# Patient Record
Sex: Male | Born: 1960
Health system: Southern US, Community
[De-identification: ages and names within clinical notes are randomized; demographics above are authoritative.]

## PROBLEM LIST (undated history)

## (undated) DIAGNOSIS — E039 Hypothyroidism, unspecified: Secondary | ICD-10-CM

## (undated) DIAGNOSIS — T4145XA Adverse effect of unspecified anesthetic, initial encounter: Secondary | ICD-10-CM

## (undated) DIAGNOSIS — F32A Depression, unspecified: Secondary | ICD-10-CM

## (undated) DIAGNOSIS — F419 Anxiety disorder, unspecified: Secondary | ICD-10-CM

## (undated) DIAGNOSIS — K635 Polyp of colon: Secondary | ICD-10-CM

## (undated) DIAGNOSIS — F319 Bipolar disorder, unspecified: Secondary | ICD-10-CM

## (undated) DIAGNOSIS — Z9889 Other specified postprocedural states: Secondary | ICD-10-CM

## (undated) DIAGNOSIS — G4730 Sleep apnea, unspecified: Secondary | ICD-10-CM

## (undated) DIAGNOSIS — Z8719 Personal history of other diseases of the digestive system: Secondary | ICD-10-CM

## (undated) DIAGNOSIS — R112 Nausea with vomiting, unspecified: Secondary | ICD-10-CM

## (undated) DIAGNOSIS — M109 Gout, unspecified: Secondary | ICD-10-CM

## (undated) DIAGNOSIS — M1990 Unspecified osteoarthritis, unspecified site: Secondary | ICD-10-CM

## (undated) DIAGNOSIS — T8859XA Other complications of anesthesia, initial encounter: Secondary | ICD-10-CM

## (undated) DIAGNOSIS — R12 Heartburn: Secondary | ICD-10-CM

## (undated) DIAGNOSIS — E119 Type 2 diabetes mellitus without complications: Secondary | ICD-10-CM

## (undated) DIAGNOSIS — K219 Gastro-esophageal reflux disease without esophagitis: Secondary | ICD-10-CM

## (undated) DIAGNOSIS — I10 Essential (primary) hypertension: Secondary | ICD-10-CM

## (undated) DIAGNOSIS — F329 Major depressive disorder, single episode, unspecified: Secondary | ICD-10-CM

## (undated) HISTORY — PX: REPLACEMENT TOTAL KNEE BILATERAL: SUR1225

## (undated) HISTORY — DX: Polyp of colon: K63.5

## (undated) HISTORY — DX: Bipolar disorder, unspecified: F31.9

## (undated) HISTORY — DX: Gout, unspecified: M10.9

## (undated) HISTORY — DX: Heartburn: R12

## (undated) HISTORY — PX: FOOT SURGERY: SHX648

## (undated) HISTORY — PX: APPENDECTOMY: SHX54

## (undated) HISTORY — PX: HERNIA REPAIR: SHX51

## (undated) HISTORY — PX: ROTATOR CUFF REPAIR: SHX139

## (undated) HISTORY — PX: MANDIBLE SURGERY: SHX707

## (undated) HISTORY — PX: SHOULDER SURGERY: SHX246

---

## 1999-01-09 ENCOUNTER — Ambulatory Visit (HOSPITAL_COMMUNITY): Admission: RE | Admit: 1999-01-09 | Discharge: 1999-01-09 | Payer: Self-pay | Admitting: Orthopaedic Surgery

## 1999-01-09 ENCOUNTER — Encounter: Payer: Self-pay | Admitting: Orthopaedic Surgery

## 2002-09-06 ENCOUNTER — Encounter: Payer: Self-pay | Admitting: Family Medicine

## 2002-09-06 LAB — CONVERTED CEMR LAB: Hgb A1c MFr Bld: 6.4 %

## 2002-09-27 ENCOUNTER — Encounter: Admission: RE | Admit: 2002-09-27 | Discharge: 2002-09-27 | Payer: Self-pay | Admitting: Family Medicine

## 2002-09-27 ENCOUNTER — Encounter: Payer: Self-pay | Admitting: Family Medicine

## 2002-11-07 ENCOUNTER — Encounter: Payer: Self-pay | Admitting: Family Medicine

## 2002-11-07 LAB — CONVERTED CEMR LAB: Hgb A1c MFr Bld: 5.6 %

## 2003-02-05 ENCOUNTER — Encounter: Payer: Self-pay | Admitting: Family Medicine

## 2003-02-05 LAB — CONVERTED CEMR LAB: Hgb A1c MFr Bld: 6.4 %

## 2003-04-20 ENCOUNTER — Encounter: Payer: Self-pay | Admitting: Family Medicine

## 2003-04-20 LAB — CONVERTED CEMR LAB: Microalbumin U total vol: 5.4 mg/L

## 2003-04-21 ENCOUNTER — Encounter: Payer: Self-pay | Admitting: Family Medicine

## 2003-04-21 LAB — CONVERTED CEMR LAB: Hgb A1c MFr Bld: 5.4 %

## 2007-02-24 ENCOUNTER — Ambulatory Visit: Payer: Self-pay | Admitting: Internal Medicine

## 2007-03-16 ENCOUNTER — Ambulatory Visit: Payer: Self-pay | Admitting: Family Medicine

## 2007-03-16 LAB — CONVERTED CEMR LAB
ALT: 25 units/L (ref 0–40)
AST: 25 units/L (ref 0–37)
Albumin: 4 g/dL (ref 3.5–5.2)
Alkaline Phosphatase: 36 units/L — ABNORMAL LOW (ref 39–117)
BUN: 20 mg/dL (ref 6–23)
Basophils Absolute: 0 10*3/uL (ref 0.0–0.1)
Basophils Relative: 0.7 % (ref 0.0–1.0)
Bilirubin, Direct: 0.1 mg/dL (ref 0.0–0.3)
CO2: 27 meq/L (ref 19–32)
Calcium: 9.3 mg/dL (ref 8.4–10.5)
Chloride: 107 meq/L (ref 96–112)
Cholesterol: 120 mg/dL (ref 0–200)
Creatinine, Ser: 1.2 mg/dL (ref 0.4–1.5)
Eosinophils Absolute: 0.1 10*3/uL (ref 0.0–0.6)
Eosinophils Relative: 2.4 % (ref 0.0–5.0)
GFR calc Af Amer: 83 mL/min
GFR calc non Af Amer: 69 mL/min
Glucose, Bld: 112 mg/dL — ABNORMAL HIGH (ref 70–99)
HCT: 44.5 % (ref 39.0–52.0)
HDL: 31.5 mg/dL — ABNORMAL LOW (ref >39.0)
Hemoglobin: 15.2 g/dL (ref 13.0–17.0)
Hgb A1c MFr Bld: 6.3 % — ABNORMAL HIGH (ref 4.6–6.0)
LDL Cholesterol: 62 mg/dL (ref 0–99)
Lymphocytes Relative: 36.6 % (ref 12.0–46.0)
MCHC: 34.1 g/dL (ref 30.0–36.0)
MCV: 83.9 fL (ref 78.0–100.0)
Monocytes Absolute: 0.7 10*3/uL (ref 0.2–0.7)
Monocytes Relative: 11.8 % — ABNORMAL HIGH (ref 3.0–11.0)
Neutro Abs: 2.8 10*3/uL (ref 1.4–7.7)
Neutrophils Relative %: 48.5 % (ref 43.0–77.0)
PSA: 0.39 ng/mL (ref 0.10–4.00)
Platelets: 254 10*3/uL (ref 150–400)
Potassium: 4.7 meq/L (ref 3.5–5.1)
RBC: 5.3 M/uL (ref 4.22–5.81)
RDW: 13.1 % (ref 11.5–14.6)
Sodium: 141 meq/L (ref 135–145)
TSH: 2.03 microintl units/mL (ref 0.35–5.50)
Total Bilirubin: 0.8 mg/dL (ref 0.3–1.2)
Total CHOL/HDL Ratio: 3.8
Total Protein: 7.2 g/dL (ref 6.0–8.3)
Triglycerides: 131 mg/dL (ref 0–149)
VLDL: 26 mg/dL (ref 0–40)
WBC: 5.6 10*3/uL (ref 4.5–10.5)

## 2007-03-17 ENCOUNTER — Encounter: Payer: Self-pay | Admitting: Family Medicine

## 2007-03-19 ENCOUNTER — Ambulatory Visit: Payer: Self-pay | Admitting: Family Medicine

## 2007-03-19 DIAGNOSIS — F329 Major depressive disorder, single episode, unspecified: Secondary | ICD-10-CM | POA: Insufficient documentation

## 2007-03-19 DIAGNOSIS — F32A Depression, unspecified: Secondary | ICD-10-CM | POA: Insufficient documentation

## 2007-03-19 DIAGNOSIS — E119 Type 2 diabetes mellitus without complications: Secondary | ICD-10-CM | POA: Insufficient documentation

## 2007-03-19 DIAGNOSIS — F518 Other sleep disorders not due to a substance or known physiological condition: Secondary | ICD-10-CM | POA: Insufficient documentation

## 2007-03-19 DIAGNOSIS — M159 Polyosteoarthritis, unspecified: Secondary | ICD-10-CM | POA: Insufficient documentation

## 2007-03-19 DIAGNOSIS — G4720 Circadian rhythm sleep disorder, unspecified type: Secondary | ICD-10-CM

## 2007-03-19 DIAGNOSIS — M109 Gout, unspecified: Secondary | ICD-10-CM | POA: Insufficient documentation

## 2007-06-12 ENCOUNTER — Ambulatory Visit: Payer: Self-pay | Admitting: Internal Medicine

## 2007-06-12 DIAGNOSIS — J019 Acute sinusitis, unspecified: Secondary | ICD-10-CM | POA: Insufficient documentation

## 2007-06-12 DIAGNOSIS — R109 Unspecified abdominal pain: Secondary | ICD-10-CM | POA: Insufficient documentation

## 2007-07-14 ENCOUNTER — Encounter: Payer: Self-pay | Admitting: Family Medicine

## 2007-08-07 ENCOUNTER — Ambulatory Visit: Payer: Self-pay | Admitting: Family Medicine

## 2007-08-07 DIAGNOSIS — R1032 Left lower quadrant pain: Secondary | ICD-10-CM | POA: Insufficient documentation

## 2007-08-07 DIAGNOSIS — K921 Melena: Secondary | ICD-10-CM | POA: Insufficient documentation

## 2007-08-07 DIAGNOSIS — K644 Residual hemorrhoidal skin tags: Secondary | ICD-10-CM | POA: Insufficient documentation

## 2007-08-11 ENCOUNTER — Ambulatory Visit: Payer: Self-pay | Admitting: Gastroenterology

## 2007-08-19 ENCOUNTER — Ambulatory Visit (HOSPITAL_COMMUNITY): Admission: RE | Admit: 2007-08-19 | Discharge: 2007-08-19 | Payer: Self-pay | Admitting: Gastroenterology

## 2007-08-19 ENCOUNTER — Encounter: Payer: Self-pay | Admitting: Gastroenterology

## 2007-08-19 ENCOUNTER — Encounter (INDEPENDENT_AMBULATORY_CARE_PROVIDER_SITE_OTHER): Payer: Self-pay | Admitting: Internal Medicine

## 2007-08-19 DIAGNOSIS — K648 Other hemorrhoids: Secondary | ICD-10-CM | POA: Insufficient documentation

## 2007-08-19 DIAGNOSIS — D126 Benign neoplasm of colon, unspecified: Secondary | ICD-10-CM | POA: Insufficient documentation

## 2007-08-25 ENCOUNTER — Ambulatory Visit: Payer: Self-pay | Admitting: Gastroenterology

## 2007-08-25 LAB — CONVERTED CEMR LAB
Fecal Occult Blood: NEGATIVE
OCCULT 1: POSITIVE — AB
OCCULT 2: POSITIVE — AB
OCCULT 3: NEGATIVE
OCCULT 4: NEGATIVE
OCCULT 5: NEGATIVE

## 2007-08-26 ENCOUNTER — Ambulatory Visit: Payer: Self-pay | Admitting: Gastroenterology

## 2007-08-27 ENCOUNTER — Encounter (INDEPENDENT_AMBULATORY_CARE_PROVIDER_SITE_OTHER): Payer: Self-pay | Admitting: Internal Medicine

## 2007-09-09 ENCOUNTER — Ambulatory Visit: Payer: Self-pay | Admitting: Gastroenterology

## 2007-09-09 ENCOUNTER — Encounter: Payer: Self-pay | Admitting: Gastroenterology

## 2007-09-09 ENCOUNTER — Encounter (INDEPENDENT_AMBULATORY_CARE_PROVIDER_SITE_OTHER): Payer: Self-pay | Admitting: Internal Medicine

## 2007-09-09 DIAGNOSIS — K250 Acute gastric ulcer with hemorrhage: Secondary | ICD-10-CM | POA: Insufficient documentation

## 2007-09-09 DIAGNOSIS — K449 Diaphragmatic hernia without obstruction or gangrene: Secondary | ICD-10-CM | POA: Insufficient documentation

## 2007-09-09 DIAGNOSIS — A048 Other specified bacterial intestinal infections: Secondary | ICD-10-CM | POA: Insufficient documentation

## 2007-09-10 ENCOUNTER — Encounter (INDEPENDENT_AMBULATORY_CARE_PROVIDER_SITE_OTHER): Payer: Self-pay | Admitting: Internal Medicine

## 2007-09-16 ENCOUNTER — Ambulatory Visit: Payer: Self-pay | Admitting: Gastroenterology

## 2007-10-12 ENCOUNTER — Ambulatory Visit: Payer: Self-pay | Admitting: Family Medicine

## 2007-10-12 DIAGNOSIS — M545 Low back pain, unspecified: Secondary | ICD-10-CM | POA: Insufficient documentation

## 2007-10-14 ENCOUNTER — Ambulatory Visit: Payer: Self-pay | Admitting: Gastroenterology

## 2007-10-14 LAB — CONVERTED CEMR LAB
Fecal Occult Blood: POSITIVE — AB
OCCULT 1: NEGATIVE
OCCULT 2: NEGATIVE
OCCULT 3: NEGATIVE
OCCULT 4: NEGATIVE
OCCULT 5: POSITIVE — AB

## 2007-10-15 ENCOUNTER — Encounter: Payer: Self-pay | Admitting: Gastroenterology

## 2007-10-15 ENCOUNTER — Encounter (INDEPENDENT_AMBULATORY_CARE_PROVIDER_SITE_OTHER): Payer: Self-pay | Admitting: Internal Medicine

## 2007-10-15 ENCOUNTER — Ambulatory Visit: Payer: Self-pay | Admitting: Gastroenterology

## 2007-10-15 LAB — CONVERTED CEMR LAB
Basophils Absolute: 0 10*3/uL (ref 0.0–0.1)
Basophils Relative: 1 % (ref 0.0–1.0)
Eosinophils Absolute: 0.1 10*3/uL (ref 0.0–0.6)
Eosinophils Relative: 1.8 % (ref 0.0–5.0)
HCT: 42.4 % (ref 39.0–52.0)
Hemoglobin: 13.9 g/dL (ref 13.0–17.0)
Lymphocytes Relative: 27.7 % (ref 12.0–46.0)
MCHC: 32.7 g/dL (ref 30.0–36.0)
MCV: 73.5 fL — ABNORMAL LOW (ref 78.0–100.0)
Monocytes Absolute: 0.5 10*3/uL (ref 0.2–0.7)
Monocytes Relative: 11.5 % — ABNORMAL HIGH (ref 3.0–11.0)
Neutro Abs: 2.6 10*3/uL (ref 1.4–7.7)
Neutrophils Relative %: 58 % (ref 43.0–77.0)
Platelets: 250 10*3/uL (ref 150–400)
RBC: 5.77 M/uL (ref 4.22–5.81)
RDW: 15.9 % — ABNORMAL HIGH (ref 11.5–14.6)
WBC: 4.4 10*3/uL — ABNORMAL LOW (ref 4.5–10.5)

## 2007-11-02 ENCOUNTER — Ambulatory Visit: Payer: Self-pay | Admitting: Gastroenterology

## 2007-11-02 ENCOUNTER — Ambulatory Visit: Payer: Self-pay | Admitting: Family Medicine

## 2007-11-02 LAB — CONVERTED CEMR LAB
Fecal Occult Blood: NEGATIVE
OCCULT 1: NEGATIVE
OCCULT 2: NEGATIVE
OCCULT 3: NEGATIVE
OCCULT 4: NEGATIVE
OCCULT 5: NEGATIVE

## 2007-11-03 ENCOUNTER — Encounter (INDEPENDENT_AMBULATORY_CARE_PROVIDER_SITE_OTHER): Payer: Self-pay | Admitting: Internal Medicine

## 2007-11-05 ENCOUNTER — Encounter (INDEPENDENT_AMBULATORY_CARE_PROVIDER_SITE_OTHER): Payer: Self-pay | Admitting: Internal Medicine

## 2007-11-06 ENCOUNTER — Encounter: Payer: Self-pay | Admitting: Family Medicine

## 2007-11-09 ENCOUNTER — Ambulatory Visit: Payer: Self-pay | Admitting: Gastroenterology

## 2007-11-09 DIAGNOSIS — Z860100 Personal history of colon polyps, unspecified: Secondary | ICD-10-CM | POA: Insufficient documentation

## 2007-11-09 DIAGNOSIS — R195 Other fecal abnormalities: Secondary | ICD-10-CM | POA: Insufficient documentation

## 2007-11-09 DIAGNOSIS — K253 Acute gastric ulcer without hemorrhage or perforation: Secondary | ICD-10-CM | POA: Insufficient documentation

## 2007-11-09 DIAGNOSIS — K2980 Duodenitis without bleeding: Secondary | ICD-10-CM | POA: Insufficient documentation

## 2007-11-16 ENCOUNTER — Ambulatory Visit: Payer: Self-pay | Admitting: Family Medicine

## 2007-11-16 ENCOUNTER — Encounter (INDEPENDENT_AMBULATORY_CARE_PROVIDER_SITE_OTHER): Payer: Self-pay | Admitting: Internal Medicine

## 2007-12-02 ENCOUNTER — Ambulatory Visit: Payer: Self-pay | Admitting: Gastroenterology

## 2007-12-02 LAB — CONVERTED CEMR LAB
Fecal Occult Blood: NEGATIVE
OCCULT 1: NEGATIVE
OCCULT 2: NEGATIVE
OCCULT 3: NEGATIVE
OCCULT 4: NEGATIVE
OCCULT 5: NEGATIVE

## 2008-01-07 ENCOUNTER — Ambulatory Visit: Payer: Self-pay | Admitting: Family Medicine

## 2008-01-07 DIAGNOSIS — N63 Unspecified lump in unspecified breast: Secondary | ICD-10-CM | POA: Insufficient documentation

## 2008-01-07 DIAGNOSIS — G4700 Insomnia, unspecified: Secondary | ICD-10-CM | POA: Insufficient documentation

## 2008-01-08 ENCOUNTER — Encounter: Admission: RE | Admit: 2008-01-08 | Discharge: 2008-01-08 | Payer: Self-pay | Admitting: Family Medicine

## 2008-01-12 ENCOUNTER — Telehealth (INDEPENDENT_AMBULATORY_CARE_PROVIDER_SITE_OTHER): Payer: Self-pay | Admitting: Internal Medicine

## 2008-03-21 ENCOUNTER — Telehealth (INDEPENDENT_AMBULATORY_CARE_PROVIDER_SITE_OTHER): Payer: Self-pay | Admitting: Internal Medicine

## 2008-03-24 ENCOUNTER — Telehealth (INDEPENDENT_AMBULATORY_CARE_PROVIDER_SITE_OTHER): Payer: Self-pay | Admitting: Internal Medicine

## 2008-03-31 ENCOUNTER — Telehealth (INDEPENDENT_AMBULATORY_CARE_PROVIDER_SITE_OTHER): Payer: Self-pay | Admitting: Internal Medicine

## 2008-04-05 ENCOUNTER — Encounter (INDEPENDENT_AMBULATORY_CARE_PROVIDER_SITE_OTHER): Payer: Self-pay | Admitting: Internal Medicine

## 2008-04-11 ENCOUNTER — Encounter: Admission: RE | Admit: 2008-04-11 | Discharge: 2008-04-11 | Payer: Self-pay | Admitting: Family Medicine

## 2008-04-20 ENCOUNTER — Encounter (INDEPENDENT_AMBULATORY_CARE_PROVIDER_SITE_OTHER): Payer: Self-pay | Admitting: *Deleted

## 2008-09-20 ENCOUNTER — Encounter (INDEPENDENT_AMBULATORY_CARE_PROVIDER_SITE_OTHER): Payer: Self-pay | Admitting: Internal Medicine

## 2008-10-24 ENCOUNTER — Ambulatory Visit: Payer: Self-pay | Admitting: Gastroenterology

## 2008-11-10 ENCOUNTER — Ambulatory Visit: Payer: Self-pay | Admitting: Gastroenterology

## 2008-11-28 ENCOUNTER — Encounter
Admission: RE | Admit: 2008-11-28 | Discharge: 2008-11-28 | Payer: Self-pay | Admitting: Physical Medicine and Rehabilitation

## 2009-06-11 ENCOUNTER — Emergency Department (HOSPITAL_COMMUNITY): Admission: EM | Admit: 2009-06-11 | Discharge: 2009-06-11 | Payer: Self-pay | Admitting: Emergency Medicine

## 2009-07-28 ENCOUNTER — Encounter: Admission: RE | Admit: 2009-07-28 | Discharge: 2009-08-25 | Payer: Self-pay | Admitting: Occupational Medicine

## 2009-09-25 ENCOUNTER — Ambulatory Visit: Payer: Self-pay | Admitting: Sports Medicine

## 2009-09-25 DIAGNOSIS — M25579 Pain in unspecified ankle and joints of unspecified foot: Secondary | ICD-10-CM | POA: Insufficient documentation

## 2009-09-25 DIAGNOSIS — S96819A Strain of other specified muscles and tendons at ankle and foot level, unspecified foot, initial encounter: Secondary | ICD-10-CM | POA: Insufficient documentation

## 2009-09-25 DIAGNOSIS — S93499A Sprain of other ligament of unspecified ankle, initial encounter: Secondary | ICD-10-CM | POA: Insufficient documentation

## 2009-09-25 DIAGNOSIS — G5750 Tarsal tunnel syndrome, unspecified lower limb: Secondary | ICD-10-CM | POA: Insufficient documentation

## 2009-10-04 ENCOUNTER — Ambulatory Visit: Payer: Self-pay | Admitting: Gastroenterology

## 2009-10-04 DIAGNOSIS — R1012 Left upper quadrant pain: Secondary | ICD-10-CM | POA: Insufficient documentation

## 2009-10-12 ENCOUNTER — Telehealth: Payer: Self-pay | Admitting: Gastroenterology

## 2009-10-12 ENCOUNTER — Ambulatory Visit: Payer: Self-pay | Admitting: Gastroenterology

## 2009-10-16 ENCOUNTER — Encounter: Payer: Self-pay | Admitting: Gastroenterology

## 2009-10-20 ENCOUNTER — Ambulatory Visit (HOSPITAL_COMMUNITY): Admission: RE | Admit: 2009-10-20 | Discharge: 2009-10-20 | Payer: Self-pay | Admitting: Gastroenterology

## 2009-11-07 ENCOUNTER — Ambulatory Visit: Payer: Self-pay | Admitting: Sports Medicine

## 2009-11-15 ENCOUNTER — Ambulatory Visit: Payer: Self-pay | Admitting: Gastroenterology

## 2009-11-16 ENCOUNTER — Encounter: Payer: Self-pay | Admitting: Sports Medicine

## 2010-06-20 ENCOUNTER — Ambulatory Visit: Payer: Self-pay | Admitting: Surgery

## 2010-06-22 LAB — PATHOLOGY REPORT

## 2010-10-28 ENCOUNTER — Encounter: Payer: Self-pay | Admitting: Gastroenterology

## 2010-11-08 NOTE — Miscellaneous (Signed)
Summary: GI PV  Clinical Lists Changes  Medications: Added new medication of DULCOLAX 5 MG  TBEC (BISACODYL) Day before procedure take 2 at 3pm and 2 at 8pm. - Signed Added new medication of METOCLOPRAMIDE HCL 10 MG  TABS (METOCLOPRAMIDE HCL) As per prep instructions. - Signed Added new medication of MIRALAX   POWD (POLYETHYLENE GLYCOL 3350) As per prep  instructions. - Signed Rx of DULCOLAX 5 MG  TBEC (BISACODYL) Day before procedure take 2 at 3pm and 2 at 8pm.;  #4 x 0;  Signed;  Entered by: Barton Fanny RN;  Authorized by: Louis Meckel MD;  Method used: [facility] [place], [place]  [address], Ph: [phone] or [phone], Fax: [phone] Rx of METOCLOPRAMIDE HCL 10 MG  TABS (METOCLOPRAMIDE HCL) As per prep instructions.;  #2 x 0;  Signed;  Entered by: Barton Fanny RN;  Authorized by: Louis Meckel MD;  Method used: [facility] [place], [place]  [address], Ph: [phone] or [phone], Fax: [phone] Rx of MIRALAX   POWD (POLYETHYLENE GLYCOL 3350) As per prep  instructions.;  #255gm x 0;  Signed;  Entered by: Barton Fanny RN;  Authorized by: Louis Meckel MD;  Method used: [facility]ically [facility]  [address], [place], [place]  [address], Ph: [phone] or [phone], Fax: [phone] Allergies: Changed allergy or adverse reaction from * TEQUIN to * TEQUIN Observations: Added new observation of ALLERGY REV: Done (10/24/2008 8:54)    Prescriptions: MIRALAX   POWD (POLYETHYLENE GLYCOL 3350) As per prep  instructions.  #255gm x 0   Entered by:   Barton Fanny RN   Authorized by:   Louis Meckel MD   Signed by:   Barton Fanny RN on 10/24/2008   Method used:   [facility]ronically to        [facility] (retail)       [address]       [place], [place]  [address]       Ph: [phone] or [phone]       Fax: [phone]   RxID:    [id number] METOCLOPRAMIDE HCL 10 MG  TABS (METOCLOPRAMIDE HCL) As per prep instructions.  #2 x 0   Entered by:   Barton Fanny RN   Authorized by:   Louis Meckel MD   Signed by:   Barton Fanny RN on 10/24/2008   Method used:   [facility]        [facility] (retail)       [address]       [place], [place]  [address]       Ph: [phone] or [phone]       Fax: [phone]   RxID:   [id number] DULCOLAX 5 MG  TBEC (BISACODYL) Day before procedure take 2 at 3pm and 2 at 8pm.  #4 x 0   Entered by:   Barton Fanny RN   Authorized by:   Louis Meckel MD   Signed by:   Barton Fanny RN on 10/24/2008   Method used:   [facility]        [facility] (retail)       [address]       [place], [place]  [address]       Ph: [phone] or [phone]       Fax: [phone]   RxID:  [id number]  

## 2010-11-08 NOTE — Procedures (Signed)
Summary: Panendoscopy w/ biopsy  Panendoscopy w/ biopsy   Imported By: Beau Fanny 10/16/2007 16:09:06  _____________________________________________________________________  External Attachment:    Type:   Image     Comment:   External Document

## 2010-11-08 NOTE — Assessment & Plan Note (Signed)
Summary: STOMACH/CLE   Vital Signs:  Patient Profile:   50 Years Old Male Weight:      218.25 pounds Temp:     98 degrees F oral Pulse rate:   72 / minute BP sitting:   110 / 78  (left arm) Cuff size:   large  Vitals Entered By: Wandra Mannan (Feb 24, 2007 11:59 AM)               Visit Type:  Acute Visit PCP:  Hetty Ely  Chief Complaint:  stomach.  History of Present Illness: Started Saturday night with  pain and then he had loose stools. Has had continued last night and today. Headache and pain in neck. Feels hungry but then when he eats the pain starts back uo. Sometimes has reflux with certain foods. Onset also accompanied by a lot of gas. No nausea.  Blood Sugar are running good. A1C in March was 5.5 .  Current Allergies (reviewed today): ! AMOXICILLIN (AMOXICILLIN)  Past Medical History:    Depression    Diabetes mellitus, type II- 12/ 2003    Gout- 2000  Past Surgical History:    Rotator cuff repair-2000 to 2004 patient had 3 surgeries on each shoulder, right and left.   Family History:    Both parents are alive and in their mid-late 32's.    Father: DM, Stroke, MI, PUD, Colon polyps ( - )    Mother:Chest pain and diverticulitis    XTG:GYIR age 66, Heart disease and DM    mgf: ????    pgm: Died with Cancer of the stomach    pgf: Died as result of MVA    Family History Depression    Family History Diabetes 1st degree relative    Family History Lung cancer    Family History of Cardiovascular disorder    Also history of stomach cancer- 2 aunts and mgm    Review of Systems       The patient complains of dyspnea on exhertion, abdominal pain, and severe indigestion/heartburn.  The patient denies fever, chest pain, and syncope.         Also has had diarrhea, runny not really liquid consistency.   Physical Exam  General:     alert, well-developed, well-nourished, well-hydrated, appropriate dress, normal appearance, healthy-appearing, cooperative to  examination, and good hygiene.   Neck:     supple.   Lungs:     normal respiratory effort, no accessory muscle use, and normal breath sounds.   Heart:     normal rate, regular rhythm, no gallop, and no rub.   Abdomen:     Soft, mild tenderness across the lower third of abd. Bowel sounds are slightly sluggish but auscultated. No rebound tenderness, actually feels beter when the pressure applied to the left LQ. Has had a problem with reflux in past that was food related. Psych:     Oriented X3, memory intact for recent and remote, normally interactive, good eye contact, not depressed appearing, not agitated, and slightly anxious.      Impression & Recommendations:  Problem # 1:  ABDOMINAL PAIN, EPIGASTRIC (ICD-789.06) Patient has some abd discomfort since Saturday with changes in the bowel movements' consistency. Bland diet for 48 hours and then gradually increase back to regular foods. Avoid the fatty, greasy foods. Prevacid 30 mg by mouth every morning at least 30 minutes before breakfast.  Medications Added to Medication List This Visit: 1)  Claritin 10 Mg Tabs (Loratadine) .[name].. 1 tab daily 2)  Zoloft 50 Mg Tabs (Sertraline hcl) .[name].. 1 tab by mouth daily 3)  Depo-testosterone 100 Mg/ml Oil (Testosterone cypionate) .... Takes 1.5 cc's every three weeks.   Patient Instructions: 1)  Bland diet for 48 hours and then start adding in the foods you normally eat. Avoid the greasy, fatty foods. 2)  Prevacid 30 mg by mouth every morning before the first meal of the day. Sampled X 20 caps.  3)  If improvement after the foods are added back, when the Prevacid is done start Prilosec 40 mg by mouth daily. Generic is available now for this drug. 4)  Keep appointment with Dr. Hetty Ely in June.  5)  RTC sooner than June if no improvement with these changes.

## 2010-11-08 NOTE — Medication Information (Signed)
Summary: [employer] Prior Auth Form-Proton Pump Inhibitor  [employer] Prior Auth Form-Proton Pump Inhibitor   Imported By: Beau Fanny 11/06/2007 13:40:23  _____________________________________________________________________  External Attachment:    Type:   Image     Comment:   External Document

## 2010-11-08 NOTE — Procedures (Signed)
Summary: EGD w/ biopsy brushing  EGD w/ biopsy brushing   Imported By: Beau Fanny 09/15/2007 14:53:27  _____________________________________________________________________  External Attachment:    Type:   Image     Comment:   External Document

## 2010-11-08 NOTE — Progress Notes (Signed)
Summary: UGI Scheduled   Phone Note Outgoing Call   Call placed by: Laureen Ochs LPN,  October 12, 2009 3:57 PM Call placed to: Patient Summary of Call: Pt. is scheduled for a Barium UGI at [facility] on 10-17-09 at 10:30am.(NPO after 12mn) I will call pt. tomorrow to advise him of appt. Initial call taken by: Laureen Ochs LPN,  October 12, 2009 3:59 PM  Follow-up for Phone Call        Above appt. information reviewd w/pt. Pt. instructed to call back as needed.  Follow-up by: Laureen Ochs LPN,  October 13, 2009 8:13 AM

## 2010-11-08 NOTE — Consult Note (Signed)
Summary: Consultation Report  Consultation Report   Imported By: Marily Memos 09/26/2009 08:45:25  _____________________________________________________________________  External Attachment:    Type:   Image     Comment:   External Document

## 2010-11-08 NOTE — Procedures (Signed)
Summary: Upper Endoscopy  Patient: [name] Note: All result statuses are Final unless otherwise noted.  Tests: (1) Upper Endoscopy (EGD)   EGD Upper Endoscopy       DONE     [facility]     [address]     [place], [place]  [address]           ENDOSCOPY PROCEDURE REPORT           PATIENT:  Jeremiah [name], Jeremiah [name]  MR#:  [id number]     BIRTHDATE:  11/28/1960, 48 yrs. old  GENDER:  male           ENDOSCOPIST:  Barbette Hair. Arlyce Dice, MD     Referred by:           PROCEDURE DATE:  10/12/2009     PROCEDURE:  EGD with biopsy     ASA CLASS:  Class II     INDICATIONS:  abdominal pain           MEDICATIONS:   Fentanyl 75 mcg IV, Versed 7 mg IV, glycopyrrolate     (Robinal) 0.2 mg IV, 0.6cc simethancone 0.6 cc PO     TOPICAL ANESTHETIC:  Exactacain Spray           DESCRIPTION OF PROCEDURE:   After the risks benefits and     alternatives of the procedure were thoroughly explained, informed     consent was obtained.  The LB [id number]-H180 [id number] endoscope was     introduced through the mouth and advanced to the third portion of     the duodenum, without limitations.  The instrument was slowly     withdrawn as the mucosa was fully examined.     <<PROCEDUREIMAGES>>           An erosion was found in the fundus. Superficial linear erosion     with pigment and small amount of blood at base erosion. Bx taken     (see image3). Large hiatal hernia.  Question of paraesophageal     hiatal hernia.  Otherwise the examination was normal.     Retroflexed views revealed no abnormalities.    The scope was then     withdrawn from the patient and the procedure completed.           COMPLICATIONS:  None           ENDOSCOPIC IMPRESSION:     1) Fundal gastric erosion     2) Questionable paraesophageal hiatal hernia     2) Otherwise normal examination     RECOMMENDATIONS:     1) await pathology results     2) My office will arrange for you to have a Barium UGI test.     This is a radiology test to  examine your UGI tract.           REPEAT EXAM:  No           ______________________________     Barbette Hair. Arlyce Dice, MD           CC:  Julieanne Manson, MD           n.     Rosalie Doctor:   Barbette Hair. Taina Landry at 10/12/2009 08:40 AM           Clarisa Kindred, [id number]  Note: An exclamation mark (!) indicates a result that was not dispersed into the flowsheet. Document Creation Date: 10/12/2009 8:40 AM _______________________________________________________________________  (1) Order result status: Final Collection or observation  date-time: 10/12/2009 08:27 Requested date-time:  Receipt date-time:  Reported date-time:  Referring Physician:   Ordering Physician: Melvia Heaps [phone]) Specimen Source:  Source: Launa Grill Order Number: [id number] Lab site:

## 2010-11-08 NOTE — Assessment & Plan Note (Signed)
Summary: ABD PAIN/HEA   Vital Signs:  Patient Profile:   50 Years Old Male Height:     69.5 inches Weight:      219 pounds Temp:     98.1 degrees F oral Pulse rate:   58 / minute BP sitting:   124 / 85  (left arm)  Vitals Entered By: Cooper Render (August 07, 2007 9:59 AM)                 PCP:  Hetty Ely  Chief Complaint:  abd pain, increased gas, some black, coffee ground stools, and now hemorrhoids.  History of Present Illness: Here due to abd pain x2-3wks, increased flatus and black stools--both lower quads centrally.  Diarrhea yesterday for the first time.  Has taken Prilosec for 3-4days with no difference.  Has changed diet recently to increase bulk.  Has hemorrhoids that flair with constipation, reason for increased fiber..  Has never seen GI.  Has a new Rx for Biaxin for chronic sinusitis from Dr Catalina Gravel not started.  Current Allergies (reviewed today): ! AMOXICILLIN (AMOXICILLIN) * TEQUIN     Review of Systems      See HPI   Physical Exam  General:     alert, well-developed, and well-nourished.  NAD Head:     normocephalic, atraumatic, and no abnormalities observed.   Mouth:     moist Lungs:     normal respiratory effort, no accessory muscle use, and normal breath sounds.   Abdomen:     tender LLQ > remainder of abd slight tenderness in mid epigastric area. soft, normal bowel sounds, no distention, no masses, no guarding, no abdominal hernia, no inguinal hernia, no hepatomegaly, and no splenomegaly.  no referred rebound, does have rebound in LLQ Rectal:     guiac neg,  firm external hemorrhoid, minimal tenderness Neurologic:     alert & oriented X3, sensation intact to light touch, and gait normal.   Skin:     turgor normal, color normal, and no rashes.   Inguinal Nodes:     no R inguinal adenopathy and no L inguinal adenopathy.   Psych:     Oriented X3, normally interactive, and good eye contact.      Impression & Recommendations:   Problem # 1:  ABDOMINAL PAIN, LEFT LOWER QUADRANT (ICD-789.04) Assessment: New due to both LLQ pain and question of melena will refer to GI for eval samples of Nexium 40 given with instructions Orders: Gastroenterology Referral (GI)   Problem # 2:  MELENA (ICD-578.1) Assessment: New per patient Orders: Gastroenterology Referral (GI)   Problem # 3:  EXTERNAL HEMORRHOIDS WITHOUT MENTION COMP (ICD-455.3) Assessment: Unchanged continue Tucks and Prep H as needed.  Complete Medication List: 1)  Zoloft 50 Mg Tabs (Sertraline hcl) .... Taking 1/2 tablet qd 2)  Depo-testosterone 100 Mg/ml Oil (Testosterone cypionate) .... Takes 1.5 cc's every three weeks. 3)  Astelin 137 Mcg/spray Soln (Azelastine hcl) .... As directed 4)  Xyzal 5 Mg Tabs (Levocetirizine dihydrochloride) .... Take 1 tablet by mouth at bedtime 5)  Omnaris 50 Mcg/act Susp (Ciclesonide) .... As directed 6)  Allergy Injections  .... Q wk 7)  Biaxin 500 Mg Tabs (Clarithromycin) .... Take 1 tablet by mouth once a day 8)  Nexium 40 Mg Cpdr (Esomeprazole magnesium) .[name].. 1 qam   Patient Instructions: 1)  refer to GI    Prescriptions: NEXIUM 40 MG  CPDR (ESOMEPRAZOLE MAGNESIUM) 1 qam Brand medically necessary #30 x 6   Entered and Authorized by:  Billie-Lynn Tyler Deis FNP   Signed by:   Gildardo Griffes FNP on 08/07/2007   Method used:   Print then Give to Patient   RxID:   [id number]  ]

## 2010-11-08 NOTE — Consult Note (Signed)
Summary: [facility] - OFFICE NOTE / DR.   [facility] - OFFICE NOTE / DR. RANJAN SHARMA   Imported By: Carin Primrose 11/19/2007 12:47:41  _____________________________________________________________________  External Attachment:    Type:   Image     Comment:   External Document

## 2010-11-08 NOTE — Assessment & Plan Note (Signed)
Summary: F/U,MC   Referring Provider:  n/a Primary Provider:  Julieanne Manson, MD   History of Present Illness: Pt presents for follow-up of right ankle pain that resulted from an injury at work on 06/11/2009. This is a worker's comp case. On the date of injury, he describes having fallen down three steps with his right foot hyper-plantarflexed.  At his last office visit, he was placed in an air cast to wear at all times. He denies any swelling but still has a lot of pain when standing on it for long periods of time or with going up and down stairs. He has both medial and lateral right ankle pain. No swelling and he has not iced it recently. He tried the Vitamin B6 but this was not helpful so he stopped taking it. He has some numbness in his right 1st toe as well as pain under his 2nd and 3rd toes when standing for long periods of time.  At his last office visit, he had an ultrasound of his ankle that showed a small fragment in the tarsal tunnel.   Allergies: 1)  ! Amoxicillin (Amoxicillin) 2)  ! * Tequin  Physical Exam  General:  alert and well-developed.   Head:  normocephalic and atraumatic.   Msk:  Right ankle: Mild swelling in the sinus tarsi + TTP along the tibia-fibular ligament and into the medial gutter and near the ATFL Full ROM in all directions Decreased strength with resisted dorsiflexion and inversion Pain with push off while weight bearing Pain with distal squeeze testing and anterior drawer sign testing Pes cavus with longitudinal arch flattening  Left Ankle: No bony abnormalities, edema or bruising Full ROM without pain 5/5 strength throughout No TTP throughout Neg anterior drawer sign and able to push off easily Neg squeeze testing   Impression & Recommendations:  Problem # 1:  OTHER ANKLE SPRAIN AND STRAIN (ICD-845.09) Assessment Unchanged  Since he has a fragment that was noted on his last ultrasound and has not improved with conservative treatment, the  patient will be referred to a foot and ankle specialist for evaluation He is to continue work as tolerated He is to wear the air cast at all times until evaluated by the foot and ankle specialist Return in 6 weeks if he has not seen the specialist by then  His updated medication list for this problem includes:    Celebrex 200 Mg Caps (Celecoxib) ..... One tablet by mouth once daily  Orders: Orthopedic Referral (Ortho)  Problem # 2:  TARSAL TUNNEL SYNDROME, RIGHT (ICD-355.5) Assessment: Unchanged  Can continue to [place] anti-inflammatories and the air cast as directed  Orders: Orthopedic Referral (Ortho)  Complete Medication List: 1)  Pristiq 100 Mg Xr24h-tab (Desvenlafaxine succinate) .... One tablet by mouth once daily 2)  Depo-testosterone 100 Mg/ml Oil (Testosterone cypionate) .... Takes 1.5 cc's every three weeks. 3)  Astelin 137 Mcg/spray Soln (Azelastine hcl) .... As directed 4)  Omnaris 50 Mcg/act Susp (Ciclesonide) .... As directed 5)  Allergy Injections  .... Q wk 6)  Claritin 10 Mg Tabs (Loratadine) .... Take 1 tablet by mouth once a day 7)  Flomax 0.4 Mg Cp24 (Tamsulosin hcl) .... Take 1 tablet by mouth once a day 8)  Celebrex 200 Mg Caps (Celecoxib) .... One tablet by mouth once daily 9)  Mylanta 200-200-20 Mg/71ml Susp (Alum & mag hydroxide-simeth) .... As needed for heartburn 10)  Aciphex 20 Mg Tbec (Rabeprazole sodium) .... Take one tab daily  Appended Document: F/U,MC Scheduled  pt to see Dr. Lestine Box at [facility]. Appt is on 2.8.11 at 10:30. Address is [address], [address]. Phone number is [phone]. Workers Comp info is [phone] x 2 and claim number is [id number]

## 2010-11-08 NOTE — Letter (Signed)
Summary: Results Follow up Letter  [place] [facility]  [address] [place], [place] [address]   Phone: [phone]  Fax: [phone]    04/20/2008 MRN: [id number]    Jeremiah [name] [address]  [address] [place], [place]  [address]    Dear Mr. Greulich,  The following are the results of your recent test(s):  Test         Result    Pap Smear:        Normal _____  Not Normal _____ Comments: ______________________________________________________ Cholesterol: LDL(Bad cholesterol):         Your goal is less than:         HDL (Good cholesterol):       Your goal is more than: Comments:  ______________________________________________________ Mammogram:  ULTRA SOUND NEGATIVE. WE NEED TO RECHECK IT IN 6 MONTHS.        Normal _____  Not Normal _____ Comments:  ___________________________________________________________________ Hemoccult:        Normal _____  Not normal _______ Comments:    _____________________________________________________________________ Other Tests:    We routinely do not discuss normal results over the telephone.  If you desire a copy of the results, or you have any questions about this information we can discuss them at your next office visit.   Sincerely,     [facility]

## 2010-11-08 NOTE — Assessment & Plan Note (Signed)
Summary: HEADACHES/SINUS   Vital Signs:  Patient Profile:   50 Years Old Male Height:     69.5 inches Weight:      224.38 pounds Temp:     97.1 degrees F oral Pulse rate:   72 / minute BP sitting:   114 / 72  (left arm) Cuff size:   large  Vitals Entered By: Wandra Mannan (June 12, 2007 11:18 AM)                 PCP:  Hetty Ely  Chief Complaint:  head ache   sinus.  History of Present Illness: Having headaches for about 4 weks Posterior neck--thought it was his pillow at first Thought it was tension but now has moved to face---entire head is painful Left temporal pain that is like a toothache--better with pressure some congestion in nose--copious discharge in AM---light green Not much cough No fever No SOB Has tried decongestants and afrin without much help  Has been to sinus problems and it feels like this now  has had some stomach problems. On lodine from [place] for arthritis--better since stopping but has persisted with slight nausea  Current Allergies (reviewed today): ! AMOXICILLIN (AMOXICILLIN) * TEQUIN     Review of Systems      See HPI   Physical Exam  General:     NAD Head:     has maxillary tenderness Ears:     R ear normal and L ear normal.   Nose:     mod inflammation Mouth:     no erythema and no exudates.   Neck:     supple and no cervical lymphadenopathy.   Lungs:     normal respiratory effort and normal breath sounds.   Abdomen:     soft.  Mild tenderness suprapubic    Impression & Recommendations:  Problem # 1:  SINUSITIS, ACUTE (ICD-461.9) Assessment: New persistent symptoms will try Rx His updated medication list for this problem includes:    Zithromax Z-pak 250 Mg Tabs (Azithromycin) .[name]... Take as directed   Problem # 2:  ABDOMINAL PAIN (ICD-789.00) Assessment: New This sounds like he was having gastritis from the lodine he has stopped it and I recommended trying 2 weeks of prilosec to calm it down  Complete  Medication List: 1)  Claritin 10 Mg Tabs (Loratadine) .[name].. 1 tab daily 2)  Zoloft 50 Mg Tabs (Sertraline hcl) .... Taking 1/2 tablet qd 3)  Depo-testosterone 100 Mg/ml Oil (Testosterone cypionate) .... Takes 1.5 cc's every three weeks. 4)  Zithromax Z-pak 250 Mg Tabs (Azithromycin) .... Take as directed   Patient Instructions: 1)  Please schedule a follow-up appointment as needed.    Prescriptions: ZITHROMAX Z-PAK 250 MG  TABS (AZITHROMYCIN) take as directed  #1 x 0   Entered and Authorized by:   Cindee Salt MD   Signed by:   Cindee Salt MD on 06/12/2007   Method used:   Print then Give to Patient   RxID:   [id number]   Prior Medications (reviewed today): CLARITIN 10 MG TABS (LORATADINE) 1 tab daily ZOLOFT 50 MG TABS (SERTRALINE HCL) taking 1/2 tablet qd DEPO-TESTOSTERONE 100 MG/ML OIL (TESTOSTERONE CYPIONATE) Takes 1.5 cc's every three weeks. Current Allergies (reviewed today): ! AMOXICILLIN (AMOXICILLIN) * TEQUIN Current Medications (including changes made in today's visit):  CLARITIN 10 MG TABS (LORATADINE) 1 tab daily ZOLOFT 50 MG TABS (SERTRALINE HCL) taking 1/2 tablet qd DEPO-TESTOSTERONE 100 MG/ML OIL (TESTOSTERONE CYPIONATE) Takes 1.5 cc's every three  weeks. ZITHROMAX Z-PAK 250 MG  TABS (AZITHROMYCIN) take as directed

## 2010-11-08 NOTE — Consult Note (Signed)
Summary: Dr.Sharma,Allergy,Note  Dr.Sharma,Allergy,Note   Imported By: Beau Fanny 11/23/2007 12:02:26  _____________________________________________________________________  External Attachment:    Type:   Image     Comment:   External Document
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
## 2010-11-08 NOTE — Letter (Signed)
Summary: [facility] Attending Physician [facility] Attending Physician Statment   Imported By: Beau Fanny 11/17/2007 09:46:44  _____________________________________________________________________  External Attachment:    Type:   Image     Comment:   External Document

## 2010-11-08 NOTE — Medication Information (Signed)
Summary: [employer] Prior Auth Approval-Nexium 11/06/07-12/05/07  [employer] Prior Auth Approval-Nexium 11/06/07-12/05/07   Imported By: Beau Fanny 11/09/2007 13:18:10  _____________________________________________________________________  External Attachment:    Type:   Image     Comment:   External Document

## 2010-11-08 NOTE — Miscellaneous (Signed)
Summary: [facility]Dr. De Witt Callas   Imported By: Eleonore Chiquito 04/12/2008 15:23:52  _____________________________________________________________________  External Attachment:    Type:   Image     Comment:   External Document

## 2010-11-08 NOTE — Progress Notes (Signed)
Summary: Paperwork for suppose socks ?  Phone Note Call from Patient Call back at [phone]   Caller: Patient Call For: Everrett Coombe, FNP Summary of Call: Caling to find out if you have received paperwork for support socks that he can get from work?   Paperwork is to be signed and sent back to his insurance. Initial call taken by: Sydell Axon,  March 21, 2008 1:40 PM  Follow-up for Phone Call        have recieved no paperwork.  he needs to check with whom ever that should have sent to [place].   Gildardo Griffes FNP  March 22, 2008 1:37 PM   Additional Follow-up for Phone Call Additional follow up Details #1::        pt informed.  will try to get paperwork to [place]. Additional Follow-up by: Cooper Render,  March 22, 2008 2:29 PM

## 2010-11-08 NOTE — Letter (Signed)
Summary: EGD Instructions  [facility]  [address] [place], [place] [address]   Phone: [phone]  Fax: [phone]       ABBAS BEYENE    1961/03/02    MRN: [id number]       Procedure Day /Date:THURSDAY 10/12/2009     Arrival Time: 7:30AM     Procedure Time:8:00AM     Location of Procedure:                    [facility] (4th Floor)   PREPARATION FOR ENDOSCOPY   On 10/12/2009 THE DAY OF THE PROCEDURE:  1.   No solid foods, milk or milk products are allowed after midnight the night before your procedure.  2.   Do not drink anything colored red or purple.  Avoid juices with pulp.  No orange juice.  3.  You may drink clear liquids until 6:00AM which is 2 hours before your procedure.                                                                                                CLEAR LIQUIDS INCLUDE: Water Jello Ice Popsicles Tea (sugar ok, no milk/cream) Powdered fruit flavored drinks Coffee (sugar ok, no milk/cream) Gatorade Juice: apple, white grape, white cranberry  Lemonade Clear bullion, consomm, broth Carbonated beverages (any kind) Strained chicken noodle soup Hard Candy   MEDICATION INSTRUCTIONS  Unless otherwise instructed, you should take regular prescription medications with a small sip of water as early as possible the morning of your procedure.             OTHER INSTRUCTIONS  You will need a responsible adult at least 52 years of age to accompany you and drive you home.   This person must remain in the waiting room during your procedure.  Wear loose fitting clothing that is easily removed.  Leave jewelry and other valuables at home.  However, you may wish to bring a book to read or an iPod/MP3 player to listen to music as you wait for your procedure to start.  Remove all body piercing jewelry and leave at home.  Total time from sign-in until discharge is approximately 2-3 hours.  You should go home directly  after your procedure and rest.  You can resume normal activities the day after your procedure.  The day of your procedure you should not:   Drive   Make legal decisions   Operate machinery   Drink alcohol   Return to work  You will receive specific instructions about eating, activities and medications before you leave.    The above instructions have been reviewed and explained to me by   _______________________    I fully understand and can verbalize these instructions _____________________________ Date _________

## 2010-11-08 NOTE — Letter (Signed)
Summary: [name] OV / DR. RANJAN S  [facility] / OV / DR. RANJAN SHARMA   Imported By: Carin Primrose 10/19/2008 16:45:10  _____________________________________________________________________  External Attachment:    Type:   Image     Comment:   External Document

## 2010-11-08 NOTE — Assessment & Plan Note (Signed)
Summary: FORM  Nurse Visit    Prior Medications: ZOLOFT 50 MG TABS (SERTRALINE HCL) taking 1/2 tablet qd DEPO-TESTOSTERONE 100 MG/ML OIL (TESTOSTERONE CYPIONATE) Takes 1.5 cc's every three weeks. ASTELIN 137 MCG/SPRAY  SOLN (AZELASTINE HCL) as directed XYZAL 5 MG  TABS (LEVOCETIRIZINE DIHYDROCHLORIDE) Take 1 tablet by mouth at bedtime OMNARIS 50 MCG/ACT  SUSP (CICLESONIDE) as directed ALLERGY INJECTIONS () q wk NEXIUM 40 MG  CPDR (ESOMEPRAZOLE MAGNESIUM) 1 qam TRAMADOL HCL 50 MG  TABS (TRAMADOL HCL) 1 q6h as needed pain Current Allergies: ! AMOXICILLIN (AMOXICILLIN) * TEQUIN      pt paid form flma form completion $20 ..................................................................Marland KitchenCarrie Mew  November 26, 2007 2:56 PM

## 2010-11-08 NOTE — Progress Notes (Signed)
Summary: Rx for Compression sockings  Phone Note Call from Patient Call back at [phone]   Caller: Patient Call For: Jeremiah Coombe, FNP Summary of Call: Pt needs a written rx for compression sockings for work.  Call when ready for pick up. Initial call taken by: Sydell Axon,  March 24, 2008 10:30 AM  Follow-up for Phone Call        does he know the compression of his hose?    Billie-Lynn Tyler Deis FNP  March 27, 2008 7:35 PM   Additional Follow-up for Phone Call Additional follow up Details #1::        left msg on voice mail.  Cooper Render  March 28, 2008 4:10 PM  noted Billie-Lynn Tyler Deis FNP  March 28, 2008 4:42 PM

## 2010-11-08 NOTE — Progress Notes (Signed)
Summary: order for compression stockings  Phone Note Call from Patient   Caller: Patient Call For: [name] Summary of Call: pc from pt, has already been measured for compression socks.  all he needs from [place] is an order written on rx pad for compression socks. may leave a msg on home ans mach, when order is ready for pick up. Initial call taken by: Cooper Render,  March 31, 2008 10:50 AM  Follow-up for Phone Call        Rx for knee high compression hose at 20-71mmpressure done, can pick up   Billie-Lynn Tyler Deis FNP  March 31, 2008 5:42 PM   Additional Follow-up for Phone Call Additional follow up Details #1::        msg left on home ans mach rx ready for p/u.  Additional Follow-up by: Cooper Render,  March 31, 2008 5:46 PM

## 2010-11-08 NOTE — Assessment & Plan Note (Signed)
Summary: TENSION IN LOWER BACK/DLO   Vital Signs:  Patient Profile:   50 Years Old Male Height:     69.5 inches Weight:      217 pounds Temp:     98.4 degrees F oral Pulse rate:   64 / minute BP sitting:   129 / 69  (right arm) Cuff size:   large  Vitals Entered By: Cooper Render (October 12, 2007 9:56 AM)                 PCP:  Hetty Ely  Chief Complaint:  low back tension.  History of Present Illness: Here due to low back pain x2wks--no known trauma or unusual activity.  Taking nothing.  Sleeping in bed.  Has not missed work--walks up and down steps.  Labs from [place] 08/27/07--HgbA1c 6.7 on current meds.  Current Allergies (reviewed today): ! AMOXICILLIN (AMOXICILLIN) * TEQUIN Updated/Current Medications (including changes made in today's visit):  ZOLOFT 50 MG TABS (SERTRALINE HCL) taking 1/2 tablet qd DEPO-TESTOSTERONE 100 MG/ML OIL (TESTOSTERONE CYPIONATE) Takes 1.5 cc's every three weeks. ASTELIN 137 MCG/SPRAY  SOLN (AZELASTINE HCL) as directed XYZAL 5 MG  TABS (LEVOCETIRIZINE DIHYDROCHLORIDE) Take 1 tablet by mouth at bedtime OMNARIS 50 MCG/ACT  SUSP (CICLESONIDE) as directed * ALLERGY INJECTIONS q wk NEXIUM 40 MG  CPDR (ESOMEPRAZOLE MAGNESIUM) 1 qam [BMN] TRAMADOL HCL 50 MG  TABS (TRAMADOL HCL) 1 q6h as needed pain SKELAXIN 800 MG  TABS (METAXALONE) 1 qid muscle spasm prn [BMN] LISINOPRIL 10 MG  TABS (LISINOPRIL) 1/2 tab every day      Review of Systems      See HPI   Physical Exam  General:     alert, well-developed, and well-nourished.  NAD Lungs:     normal respiratory effort.   Msk:     no pain with palp spine and both paraspinous areas. pain on forward flex  ~50-60degrees, no pain with lat flex or rotation. pain on internal rotation L and abduction L only. Neurologic:     alert & oriented X3, sensation intact to light touch, and gait normal.   Skin:     turgor normal, color normal, and no rashes.   Psych:     normally interactive.       Impression & Recommendations:  Problem # 1:  BACK PAIN, LUMBAR (ICD-724.2) Assessment: New new lumbar pain, et unknown will try on Skelaxin and Tramadol limit sitting, encouraged standing and lying discussed squatting to pick up anything rather than bending over see back 7-10d if not improved--possible PT referral His updated medication list for this problem includes:    Tramadol Hcl 50 Mg Tabs (Tramadol hcl) .[name]... 1 q6h as needed pain    Skelaxin 800 Mg Tabs (Metaxalone) .[name]... 1 qid muscle spasm prn   Complete Medication List: 1)  Zoloft 50 Mg Tabs (Sertraline hcl) .... Taking 1/2 tablet qd 2)  Depo-testosterone 100 Mg/ml Oil (Testosterone cypionate) .... Takes 1.5 cc's every three weeks. 3)  Astelin 137 Mcg/spray Soln (Azelastine hcl) .... As directed 4)  Xyzal 5 Mg Tabs (Levocetirizine dihydrochloride) .... Take 1 tablet by mouth at bedtime 5)  Omnaris 50 Mcg/act Susp (Ciclesonide) .... As directed 6)  Allergy Injections  .... Q wk 7)  Nexium 40 Mg Cpdr (Esomeprazole magnesium) .[name].. 1 qam 8)  Tramadol Hcl 50 Mg Tabs (Tramadol hcl) .[name].. 1 q6h as needed pain 9)  Skelaxin 800 Mg Tabs (Metaxalone) .[name].. 1 qid muscle spasm prn 10)  Lisinopril 10 Mg Tabs (Lisinopril) .Marland KitchenMarland KitchenMarland Kitchen  1/2 tab every day     Prescriptions: SKELAXIN 800 MG  TABS (METAXALONE) 1 qid muscle spasm prn Brand medically necessary #40 x 0   Entered and Authorized by:   Gildardo Griffes FNP   Signed by:   Gildardo Griffes FNP on 10/12/2007   Method used:   Print then Give to Patient   RxID:   [id number] TRAMADOL HCL 50 MG  TABS (TRAMADOL HCL) 1 q6h as needed pain  #40 x 0   Entered and Authorized by:   Gildardo Griffes FNP   Signed by:   Gildardo Griffes FNP on 10/12/2007   Method used:   Print then Give to Patient   RxID:   [id number]  ]

## 2010-11-08 NOTE — Procedures (Signed)
Summary: Colonoscopy   Colonoscopy  Procedure date:  11/10/2008  Findings:      Location:  [facility].    Procedures Next Due Date:    Colonoscopy: 11/2013  COLONOSCOPY PROCEDURE REPORT  PATIENT:  Jeremiah [name], Jeremiah [name]  MR#:  [id number] BIRTHDATE:   1961/04/26   GENDER:   male  ENDOSCOPIST:   Barbette Hair. Arlyce Dice, MD Referred by: Laurita Quint, M.D.  PROCEDURE DATE:  11/10/2008 PROCEDURE:  Colonoscopy, diagnostic ASA CLASS:   Class I INDICATIONS: 1) history of pre-cancerous (adenomatous) colon polyps   MEDICATIONS:    Fentanyl 75 mcg, Versed 9 mg  DESCRIPTION OF PROCEDURE:   After the risks benefits and alternatives of the procedure were thoroughly explained, informed consent was obtained.  Digital rectal exam was performed and revealed no abnormalities.   The LB [id number] endoscope was introduced through the anus and advanced to the cecum, which was identified by both the appendix and ileocecal valve, without limitations.  The quality of the prep was good, using MiraLax.  The instrument was then slowly withdrawn as the colon was fully examined. <<PROCEDUREIMAGES>>                <<OLD IMAGES>>  FINDINGS:  Mild diverticulosis was found in the sigmoid colon (see image11).  This was otherwise a normal examination (see image2, image3, image5, image7, image8, image10, image13, and image14).   Retroflexed views in the rectum revealed no abnormalities.    The time to cecum =  minutes. The scope was then withdrawn (time =  min) from the patient and the procedure completed.  COMPLICATIONS:   None  ENDOSCOPIC IMPRESSION:  1) Mild diverticulosis in the sigmoid colon  2) Otherwise normal examination RECOMMENDATIONS:  1) colonoscopy  5 years  REPEAT EXAM:   In 5 year(s) for Colonoscopy.   _______________________________ Barbette Hair. Arlyce Dice, MD    CC: Laurita Quint, MD

## 2010-11-08 NOTE — Letter (Signed)
Summary: *Referral Letter  [facility]  [address]   [place], [place] [address]   Phone: [phone]  Fax: [phone]    11/16/2009 Dr. Leonides Grills [facility] Fax [phone]  Dear Dr. Lestine Box:  Thank you in advance for agreeing to see my patient:  Jeremiah [name] [address] [place], [place]  [address]  Phone: [phone]  Reason for Referral: This is a very nice, hard working gentleman who had a work related ankle injury involving extreme plantar flexion.  Original injury in September and failed to respond to conservative care.  I have seen him twice and felt on [facility] [place] scanning that he might have a partial tear of his tibio-fibular syndesmosis.  I could not detect any loose body or talar dome injury but he did have significant swelling persisting.  We splinted him for additional time but with failure to respond to that I think he needs your expertise and further diagnostic testing.  Procedures Requested: Consultation and probable ankle arthroscopy or other surgery if warranted.  Current Medical Problems: 1)  ABDOMINAL PAIN, LEFT UPPER QUADRANT (ICD-789.02) 2)  TARSAL TUNNEL SYNDROME, RIGHT (ICD-355.5) 3)  OTHER ANKLE SPRAIN AND STRAIN (ICD-845.09) 4)  ANKLE PAIN, RIGHT (ICD-719.47)     Current Medications: 1)  PRISTIQ 100 MG XR24H-TAB (DESVENLAFAXINE SUCCINATE) one tablet by mouth once daily 2)  DEPO-TESTOSTERONE 100 MG/ML OIL (TESTOSTERONE CYPIONATE) Takes 1.5 cc's every three weeks. 3)  ASTELIN 137 MCG/SPRAY  SOLN (AZELASTINE HCL) as directed 4)  OMNARIS 50 MCG/ACT  SUSP (CICLESONIDE) as directed 5)  * ALLERGY INJECTIONS q wk 6)  CLARITIN 10 MG  TABS (LORATADINE) Take 1 tablet by mouth once a day 7)  FLOMAX 0.4 MG  CP24 (TAMSULOSIN HCL) Take 1 tablet by mouth once a day 8)  CELEBREX 200 MG CAPS (CELECOXIB) one tablet by mouth once daily 9)  MYLANTA 200-200-20 MG/5ML SUSP (ALUM & MAG HYDROXIDE-SIMETH) as needed for heartburn 10)   ACIPHEX 20 MG TBEC (RABEPRAZOLE SODIUM) take one tab daily 11)  * PREDNISONE TAPER take as directed   Past Medical History: 1)  Current Problems:  2)  COLONIC POLYPS, ADENOMATOUS (ICD-211.3) 3)  HEMOCCULT POSITIVE STOOL (ICD-578.1) 4)  HIATAL HERNIA (ICD-553.3) 5)  GASTRIC ULCER, ACUTE, HEMORRHAGE (ICD-531.00) 6)  HELICOBACTER PYLORI GASTRITIS (ICD-041.86) 7)  INTERNAL HEMORRHOIDS (ICD-455.0) 8)  EXTERNAL HEMORRHOIDS WITHOUT MENTION COMP (ICD-455.3)   Thank you again for agreeing to see our patient; please contact us at [phone] if you have any further questions or need additional information.  Sincerely,   Vincent Gros MD

## 2010-11-08 NOTE — Consult Note (Signed)
Summary: [facility] / Dr. Senecaville Callas  [facility] / Dr. Travilah Callas   Imported By: Carin Primrose 07/23/2007 13:28:23  _____________________________________________________________________  External Attachment:    Type:   Image     Comment:   External Document

## 2010-11-08 NOTE — Assessment & Plan Note (Signed)
Summary: RIGHT BREAST LUMP/MK   Vital Signs:  Patient Profile:   50 Years Old Male Height:     69.5 inches Weight:      217 pounds Temp:     98.5 degrees F oral Pulse rate:   60 / minute BP sitting:   126 / 93  (right arm) Cuff size:   large  Vitals Entered By: Cooper Render (January 07, 2008 8:22 AM)                 PCP:  Hetty Ely  Chief Complaint:  lump R) breast.  History of Present Illness: Here due to lump in R breast--found 2wks ago, got smaller and tenderness went away, with manipulation came back and is tender--not sure is larger, thinks is harder.  No nipple discharge.  No family hx of breast cancer.  Still not sleeping with shift change q2wks.  Has tried OTC sleep aids, encluding melatonin with no improvement.      Current Allergies (reviewed today): ! AMOXICILLIN (AMOXICILLIN) * TEQUIN  Past Medical History:    Reviewed history from 02/24/2007 and no changes required:       Depression       Diabetes mellitus, type II- 12/ 2003       Gout- 2000     Review of Systems      See HPI   Physical Exam  General:     alert, well-developed, well-nourished, and well-hydrated.  NAD Breasts:     mass 2-58mm sy 11 oclock and another aboout the same size at 9 oclock Lungs:     normal respiratory effort, no intercostal retractions, no accessory muscle use, and normal breath sounds.   Neurologic:     alert & oriented X3, sensation intact to light touch, and gait normal.   Skin:     turgor normal, color normal, and no rashes.   Axillary Nodes:     no R axillary adenopathy and no L axillary adenopathy.   Psych:     normally interactive, good eye contact, and slightly anxious.      Impression & Recommendations:  Problem # 1:  BREAST MASS, RIGHT (ICD-611.72) Assessment: New new masses found by patients girlfriend will refer for mammo/U/S  refer as needed discussion re possibe pathology  Orders: Radiology Referral (Radiology)   Problem # 2:  INSOMNIA  (ICD-780.52) Assessment: Unchanged due to shift change q2wks and continued problems establishing a sleep program, will start on Rozerem q bedtime samples and discussion re how it works done call response in 2 wks His updated medication list for this problem includes:    Rozerem 8 Mg Tabs (Ramelteon) .[name]... 1 before hs   Complete Medication List: 1)  Zoloft 50 Mg Tabs (Sertraline hcl) .... Taking 1/2 tablet qd 2)  Depo-testosterone 100 Mg/ml Oil (Testosterone cypionate) .... Takes 1.5 cc's every three weeks. 3)  Astelin 137 Mcg/spray Soln (Azelastine hcl) .... As directed 4)  Omnaris 50 Mcg/act Susp (Ciclesonide) .... As directed 5)  Allergy Injections  .... Q wk 6)  Tramadol Hcl 50 Mg Tabs (Tramadol hcl) .[name].. 1 q6h as needed pain 7)  Claritin 10 Mg Tabs (Loratadine) .... Take 1 tablet by mouth once a day 8)  Prilosec Otc 20 Mg Tbec (Omeprazole magnesium) .... Take 1 tablet by mouth once a day 9)  Flomax 0.4 Mg Cp24 (Tamsulosin hcl) .... Take 1 tablet by mouth once a day 10)  Rozerem 8 Mg Tabs (Ramelteon) .[name].. 1 before hs  Patient Instructions: 1)  refers to Breast [facility]Enter    ] Prior Medications (reviewed today): ZOLOFT 50 MG TABS (SERTRALINE HCL) taking 1/2 tablet qd DEPO-TESTOSTERONE 100 MG/ML OIL (TESTOSTERONE CYPIONATE) Takes 1.5 cc's every three weeks. ASTELIN 137 MCG/SPRAY  SOLN (AZELASTINE HCL) as directed OMNARIS 50 MCG/ACT  SUSP (CICLESONIDE) as directed ALLERGY INJECTIONS () q wk TRAMADOL HCL 50 MG  TABS (TRAMADOL HCL) 1 q6h as needed pain CLARITIN 10 MG  TABS (LORATADINE) Take 1 tablet by mouth once a day PRILOSEC OTC 20 MG  TBEC (OMEPRAZOLE MAGNESIUM) Take 1 tablet by mouth once a day FLOMAX 0.4 MG  CP24 (TAMSULOSIN HCL) Take 1 tablet by mouth once a day ROZEREM 8 MG  TABS (RAMELTEON) 1 before hs Current Allergies (reviewed today): ! AMOXICILLIN (AMOXICILLIN) * Lorenda Cahill

## 2010-11-08 NOTE — Letter (Signed)
Summary: Generic Letter  [facility]  [address] [place], [place] [address]   Phone: [phone]  Fax: [phone]    10/16/2009  KAHLE MCQUEEN [address]  [address] [place], [place]  [address]  Dear Jeremiah [name],   Your biopsies demonstrated inflammatory changes only.    Please follow the recommendations previously discussed.  Should you have any immediate concerns or questions, feel free to contact me at the office.    Sincerely,  Barbette Hair. Arlyce Dice, M.D., [facility]            Sincerely,   Melvia Heaps MD  Appended Document: Generic Letter October 16, 2009 Jeremiah [name]    Jeremiah [name] [address]  [address] [place], [place]  [address]     Dear Jeremiah [name],   Your biopsies demonstrated inflammatory changes only.    Please follow the recommendations previously discussed.  Should you have any immediate concerns or questions, feel free to contact me at the office.      Sincerely,  Barbette Hair. Arlyce Dice, M.D., [facility]    Letter mailed 01.12.11

## 2010-11-08 NOTE — Assessment & Plan Note (Signed)
Summary: CPX/HEA   Vital Signs:  Patient Profile:   50 Years Old Male Height:     69.5 inches Weight:      217.25 pounds Temp:     98.6 degrees F oral Pulse rate:   64 / minute Pulse rhythm:   regular BP sitting:   100 / 70  (left arm) Cuff size:   large  Vitals Entered By: Providence Crosby (March 19, 2007 3:03 PM)               PCP:  Hetty Ely  Chief Complaint:  cpx .  History of Present Illness: Works rotating shifts. Has difficulty sleeping. O/w has arthritis in knees Gets injections q 6mos but has lots of pain.  Current Allergies (reviewed today): ! AMOXICILLIN (AMOXICILLIN) * TEQUIN   Family History:        Father: A  18  DM, Stroke, MI, PUD, Colon polyps ( - )    Mother: A  68 Chest pain and diverticulitis    Sister A 41 Bipolar Manic/Depressive RAD Migraines    XBJ:YNWG age 78, Heart disease and DM    pgm: Died with Cancer of the stomach    pgf: Died as result of MVA    Family History Depression    Family History Diabetes 1st degree relative    Family History Lung cancer    Family History of Cardiovascular disorder    Also history of stomach cancer- 2 aunts and mgm  Social History:    Marital Status: Married/divorced    Children: One    Occupation: Social worker   Risk Factors:  Passive smoke exposure:  no Drug use:  no HIV high-risk behavior:  no Caffeine use:  4 drinks per day Alcohol use:  no Exercise:  yes    Times per week:  3    Type:  [employer] weight lift Seatbelt use:  100 %   Review of Systems  General      Denies chills, fatigue, fever, loss of appetite, malaise, sleep disorder, sweats, weakness, and weight loss.  Eyes      Denies blurring, discharge, double vision, eye irritation, eye pain, halos, itching, light sensitivity, red eye, vision loss-1 eye, and vision loss-both eyes.      presbyopia  ENT      Complains of decreased hearing.      Denies difficulty swallowing, ear discharge, earache, hoarseness, nasal congestion,  nosebleeds, postnasal drainage, ringing in ears, sinus pressure, and sore throat.      2/08 signif hearing loss  CV      Denies bluish discoloration of lips or nails, chest pain or discomfort, difficulty breathing at night, difficulty breathing while lying down, fainting, fatigue, leg cramps with exertion, lightheadness, near fainting, palpitations, shortness of breath with exertion, swelling of feet, swelling of hands, and weight gain.  Resp      Denies chest discomfort, chest pain with inspiration, cough, coughing up blood, excessive snoring, hypersomnolence, morning headaches, pleuritic, shortness of breath, sputum productive, and wheezing.  GI      Denies abdominal pain, bloody stools, change in bowel habits, constipation, dark tarry stools, diarrhea, excessive appetite, gas, hemorrhoids, indigestion, loss of appetite, nausea, vomiting, vomiting blood, and yellowish skin color.  GU      Complains of nocturia.      Denies decreased libido, discharge, dysuria, erectile dysfunction, genital sores, hematuria, incontinence, urinary frequency, and urinary hesitancy.      2-3 times/nite  MS  Complains of joint pain.      Denies joint redness, joint swelling, loss of strength, low back pain, mid back pain, muscle aches, muscle , cramps, muscle weakness, stiffness, and thoracic pain.      shoulders  Derm      Denies changes in color of skin, changes in nail beds, dryness, excessive perspiration, flushing, hair loss, insect bite(s), itching, lesion(s), poor wound healing, and rash.      Dr Gwen Pounds for skin tags.   Physical Exam  General:     Well-developed,well-nourished,in no acute distress; alert,appropriate and cooperative throughout examination Head:     Normocephalic and atraumatic without obvious abnormalities. No apparent alopecia or balding. Eyes:     Conjunctiva clear bilaterally.  Ears:     External ear exam shows no significant lesions or deformities.  Otoscopic  examination reveals clear canals, tympanic membranes are intact bilaterally without bulging, retraction, inflammation or discharge. Hearing is grossly normal bilaterally. Nose:     External nasal examination shows no deformity or inflammation. Nasal mucosa are pink and moist without lesions or exudates. Mouth:     Oral mucosa and oropharynx without lesions or exudates.  Teeth in good repair. Neck:     No deformities, masses, or tenderness noted. Chest Wall:     No deformities, masses, tenderness or gynecomastia noted. Lungs:     Normal respiratory effort, chest expands symmetrically. Lungs are clear to auscultation, no crackles or wheezes. Heart:     Normal rate and regular rhythm. S1 and S2 normal without gallop, murmur, click, rub or other extra sounds. Abdomen:     Bowel sounds positive,abdomen soft and non-tender without masses, organomegaly or hernias noted. Rectal:     No external abnormalities noted. Normal sphincter tone. No rectal masses or tenderness. G neg Genitalia:     Testes bilaterally descended without nodularity, tenderness or masses. No scrotal masses or lesions. No penis lesions or urethral discharge. Prostate:     Prostate gland firm and smooth, no enlargement, nodularity, tenderness, mass, asymmetry or induration. 10-20gms Msk:     No deformity or scoliosis noted of thoracic or lumbar spine.   Pulses:     R and L carotid,radial,femoral,dorsalis pedis and posterior tibial pulses are full and equal bilaterally Extremities:     No clubbing, cyanosis, edema, or deformity noted with normal full range of motion of all joints.   Neurologic:     No cranial nerve deficits noted. Station and gait are normal. Plantar reflexes are down-going bilaterally. DTRs are symmetrical throughout. Sensory, motor and coordinative functions appear intact. Monofilament of feet nml. Skin:     Intact without suspicious lesions or rashes Cervical Nodes:     No lymphadenopathy noted Inguinal  Nodes:     No significant adenopathy    Impression & Recommendations:  Problem # 1:  EXAMINATION, ROUTINE MEDICAL (ICD-V70.0) Assessment: Comment Only  Problem # 2:  DISORDER, CIRCADIAN RHYTHM SLEEP, NONORGANIC (ICD-307.45) Due to rotating shift work every 2 weeks. Discussed approach and trial of Melatonin 300-511mcg one hr prior sleep and establishing regular routine.  Problem # 3:  DEPRESSION (ICD-311) Assessment: Improved Stable His updated medication list for this problem includes:    Zoloft 50 Mg Tabs (Sertraline hcl) .[name]... Taking 1/2 tablet qd   Problem # 4:  OSTEOARTHROSIS, GENERALIZED, MULTIPLE SITES (ICD-715.09) Assessment: Unchanged Trial of tyl regularly as directed.  Heat and ice as able.  Medications Added to Medication List This Visit: 1)  Zoloft 50 Mg Tabs (Sertraline hcl) .Marland KitchenMarland KitchenMarland Kitchen  Taking 1/2 tablet qd   Patient Instructions: 1)  Try Tyl  2)  Try Melatonin 398mcg-500mcg 1 hr before bed.

## 2010-11-15 ENCOUNTER — Ambulatory Visit: Payer: Self-pay

## 2010-12-23 LAB — GLUCOSE, CAPILLARY
Glucose-Capillary: 121 mg/dL — ABNORMAL HIGH (ref 70–99)
Glucose-Capillary: 128 mg/dL — ABNORMAL HIGH (ref 70–99)

## 2011-02-19 NOTE — Assessment & Plan Note (Signed)
[facility]                         GASTROENTEROLOGY OFFICE NOTE   DUGAN, VANHOESEN                         MRN:          [id number]  DATE:11/09/2007                            DOB:          04-May-1961    PROBLEM:  Hem positive stool.   Mr. Zeitz has returned for a scheduled GI followup.  Followup endoscopy  demonstrated a partially healed gastric ulcer in the area of the gastric  cardia.  Duodenal erosions were also present.  Biopsies were positive  for H. pylori for which he is currently undergoing therapy with Pylera  and omeprazole.  He has no GI complaints.  A recent CBC was pertinent  for a hemoglobin of 13.9.   On exam, pulse 72, blood pressure 118/78, weight 218.   IMPRESSION:  1. Heme positive stool -- likely secondary to active peptic disease      and pulsatoity in the stomach.  2. Recent H. pylori infection.   RECOMMENDATIONS:  Followup hemoccults.     Barbette Hair. Arlyce Dice, MD,FACG  Electronically Signed    RDK/MedQ  DD: 11/09/2007  DT: 11/09/2007  Job #: [id number]   cc:   Arta Silence, MD

## 2011-02-19 NOTE — Assessment & Plan Note (Signed)
[facility]                         GASTROENTEROLOGY OFFICE NOTE   Jeremiah, [name]                         MRN:          [id number]  DATE:09/16/2007                            DOB:          Mar 23, 1961    PROBLEM:  Heme positive stool.   Jeremiah [name] has returned following upper and lower endoscopy. The former  demonstrated a superficial ulcer in the gastric cardia. Biopsies  demonstrated H-pylori. Colonoscopy demonstrated a 9-mm splenic flexure  polyp. This was removed. Jeremiah [name] has no GI complaints at this time.  His abdominal pain has entirely subsided. Hemoccult cards on August 25, 2007 demonstrated heme positive stools.   PHYSICAL EXAMINATION:  Pulse 60, blood pressure 118/80, weight 220.   IMPRESSION:  1. Heme positive-likely secondary to partially healed gastric ulcer.  2. Colonic polyposis. No recommendations.  3. H-pylori infection.   RECOMMENDATIONS:  1. Continue Nexium while adding Pylora for his H-pylori.  2. Followup hemoccults.  3. Followup endoscopy in approximately one month.  4. Colonoscopy in approximately one year.     Barbette Hair. Arlyce Dice, MD,FACG  Electronically Signed    RDK/MedQ  DD: 09/16/2007  DT: 09/16/2007  Job #: [id number]   cc:   Arta Silence, MD

## 2011-02-19 NOTE — Letter (Signed)
August 11, 2007    Willaim Sheng D. Jillyn Hidden, FNP  [address] [place], [place] [address]   RE:  Jeremiah [name], Jeremiah [name]  MRN:  [id number]  /  DOB:  [birth year]   Dear Willaim Sheng D. Bean, FNP:   Upon your kind referral, I had the pleasure of evaluating your patient  and I am pleased to offer my findings.  I saw Mr. Jeremiah [name] in the office  today.  Enclosed is a copy of my progress note that details my findings  and recommendations.   Thank you for the opportunity to participate in your patient's care.    Sincerely,      Barbette Hair. Arlyce Dice, MD,FACG  Electronically Signed    RDK/MedQ  DD: 08/11/2007  DT: 08/12/2007  Job #: [id number]

## 2011-02-19 NOTE — Assessment & Plan Note (Signed)
[facility]                         GASTROENTEROLOGY OFFICE NOTE   MARQUISE, WICKE                         MRN:          [id number]  DATE:08/11/2007                            DOB:          1961-08-22    REASON FOR CONSULTATION:  Abdominal pain.   The patient is a 50 year old white male referred through the courtesy of  Billie D. Bean, FNP and Dr. Hetty Ely for evaluation.  Over the past four  weeks he has been complaining of sharp lower abdominal pain.  The pain  is both on the left and right sides.  It is associated with excess gas.  There has been no change in bowel habits, per se, though he does claim  to have passed some black and very dark red-colored stools.  He tested  hemoccult negative in Dr. Lorenza Chick office.  There is no history of  frank hematochezia.  He is on no gastric irritants including  nonsteroidals.  Blood work on November 3, was pertinent for a white  count of 5.6 and hemoglobin 15.2.  BUN was normal.   PAST MEDICAL HISTORY:  Pertinent for diabetes and arthritis.  He has  depression.  He is status post herniorrhaphy.   FAMILY HISTORY:  Pertinent for mother with heart disease and father with  stroke.   MEDICATIONS:  Zoloft, testosterone, Astelin, Xyzal, Biaxin, and Nexium  which he was started on last week.   ALLERGIES:  TEQUIN, AMOXICILLIN.   SOCIAL HISTORY:  He neither smokes nor drinks.  He is divorced and works  as a Best boy at [employer].   REVIEW OF SYSTEMS:  Positive for joint pain, sleeping problems, back  pain, headaches, and excessive urination.   PHYSICAL EXAMINATION:  VITAL SIGNS:  Pulse 80, blood pressure 104/70,  weight 222.  HEENT: EOMI.  PERRLA.  Sclerae are anicteric.  Conjunctivae are pink.  NECK:  Supple without thyromegaly, adenopathy or carotid bruits.  CHEST:  Clear to auscultation and percussion without adventitious  sounds.  CARDIAC:  Regular rhythm; normal S1 S2.  There are no murmurs,  gallops  or rubs.  ABDOMEN:  He has mild lower abdominal discomfort to palpation without  guarding or rebound.  No abdominal masses or organomegaly.  EXTREMITIES:  Full range of motion.  No cyanosis, clubbing or edema.  RECTAL:  Deferred.   IMPRESSION:  Persistent lower abdominal pain with questionable history  of melenic stool.  Symptoms are prolonged for diverticulitis.  Colitis  is a consideration as well as a structural lesion of the colon.  An  upper GI bleeding source is less likely.   RECOMMENDATION:  1. Colonoscopy.  2. Serial hemoccults.  If positive, then I would proceed with upper      endoscopy if the colonoscopy is not diagnostic for a GI bleeding      source.     Barbette Hair. Arlyce Dice, MD,FACG  Electronically Signed    RDK/MedQ  DD: 08/11/2007  DT: 08/12/2007  Job #: [id number]   cc:   Willaim Sheng D. Bean, FNP  Arta Silence, MD

## 2011-02-19 NOTE — Letter (Signed)
August 11, 2007    Jeremiah [name]   RE:  SHOJI, PERTUIT  MRN:  [id number]  /  DOB:  1961-01-07   Dear [name]:   It is my pleasure to have treated you recently as a new patient in my  office.  I appreciate your confidence and the opportunity to participate  in your care.   Since I do have a busy inpatient endoscopy schedule and office schedule,  my office hours vary weekly.  I am, however, available for emergency  calls every day through my office.  If I cannot promptly meet an urgent  office appointment, another one of our gastroenterologists will be able  to assist you.   My well-trained staff are prepared to help you at all times.  For  emergencies after office hours, a physician from our gastroenterology  section is always available through my 24-hour answering service.   While you are under my care, I encourage discussion of your questions  and concerns, and I will be happy to return your calls as soon as I am  available.   Once again, I welcome you as a new patient and I look forward to a happy  and healthy relationship.    Sincerely,      Barbette Hair. Arlyce Dice, MD,FACG  Electronically Signed   RDK/MedQ  DD: 08/11/2007  DT: 08/12/2007  Job #: [id number]

## 2011-07-29 ENCOUNTER — Ambulatory Visit: Payer: Self-pay | Admitting: Family Medicine

## 2011-10-10 ENCOUNTER — Ambulatory Visit: Payer: Self-pay | Admitting: Family Medicine

## 2011-10-26 ENCOUNTER — Emergency Department: Payer: Self-pay | Admitting: Emergency Medicine

## 2011-10-27 LAB — COMPREHENSIVE METABOLIC PANEL
Albumin: 3.6 g/dL (ref 3.4–5.0)
Alkaline Phosphatase: 31 U/L — ABNORMAL LOW (ref 50–136)
Anion Gap: 13 (ref 7–16)
BUN: 19 mg/dL — ABNORMAL HIGH (ref 7–18)
Bilirubin,Total: 0.6 mg/dL (ref 0.2–1.0)
Calcium, Total: 8.5 mg/dL (ref 8.5–10.1)
Chloride: 103 mmol/L (ref 98–107)
Co2: 25 mmol/L (ref 21–32)
Creatinine: 0.98 mg/dL (ref 0.60–1.30)
EGFR (African American): >60
EGFR (Non-African Amer.): >60
Glucose: 135 mg/dL — ABNORMAL HIGH (ref 65–99)
Osmolality: 286 (ref 275–301)
Potassium: 5.1 mmol/L (ref 3.5–5.1)
SGOT(AST): 62 U/L — ABNORMAL HIGH (ref 15–37)
SGPT (ALT): 33 U/L
Sodium: 141 mmol/L (ref 136–145)
Total Protein: 7 g/dL (ref 6.4–8.2)

## 2011-10-27 LAB — CBC
HCT: 41.1 % (ref 40.0–52.0)
HGB: 13.9 g/dL (ref 13.0–18.0)
MCH: 31.1 pg (ref 26.0–34.0)
MCHC: 33.7 g/dL (ref 32.0–36.0)
MCV: 92 fL (ref 80–100)
Platelet: 135 10*3/uL — ABNORMAL LOW (ref 150–440)
RBC: 4.45 10*6/uL (ref 4.40–5.90)
RDW: 12.6 % (ref 11.5–14.5)
WBC: 4.2 10*3/uL (ref 3.8–10.6)

## 2011-10-27 LAB — DRUG SCREEN, URINE

## 2011-10-27 LAB — URINALYSIS, COMPLETE
Bacteria: NONE SEEN
Bilirubin,UR: NEGATIVE
Blood: NEGATIVE
Glucose,UR: NEGATIVE mg/dL (ref 0–75)
Leukocyte Esterase: NEGATIVE
Nitrite: NEGATIVE
Ph: 5 (ref 4.5–8.0)
Protein: NEGATIVE
RBC,UR: NONE SEEN /HPF (ref 0–5)
Specific Gravity: 1.012 (ref 1.003–1.030)
Squamous Epithelial: NONE SEEN
WBC UR: <1 /HPF (ref 0–5)

## 2011-10-27 LAB — VALPROIC ACID LEVEL: Valproic Acid: 23 ug/mL — ABNORMAL LOW

## 2011-10-27 LAB — TROPONIN I: Troponin-I: <0.02 ng/mL

## 2012-07-09 ENCOUNTER — Ambulatory Visit: Payer: Self-pay | Admitting: Family Medicine

## 2012-10-29 ENCOUNTER — Ambulatory Visit: Payer: Self-pay | Admitting: Family Medicine

## 2013-01-14 ENCOUNTER — Encounter (HOSPITAL_COMMUNITY): Payer: Self-pay | Admitting: Emergency Medicine

## 2013-01-14 ENCOUNTER — Emergency Department (HOSPITAL_COMMUNITY)
Admission: EM | Admit: 2013-01-14 | Discharge: 2013-01-14 | Disposition: A | Discharge status: Home / Self Care | Payer: Worker's Compensation | Attending: Emergency Medicine | Admitting: Emergency Medicine

## 2013-01-14 ENCOUNTER — Emergency Department (HOSPITAL_COMMUNITY): Payer: Worker's Compensation

## 2013-01-14 DIAGNOSIS — Z79899 Other long term (current) drug therapy: Secondary | ICD-10-CM | POA: Insufficient documentation

## 2013-01-14 DIAGNOSIS — IMO0002 Reserved for concepts with insufficient information to code with codable children: Secondary | ICD-10-CM | POA: Insufficient documentation

## 2013-01-14 DIAGNOSIS — Y9389 Activity, other specified: Secondary | ICD-10-CM | POA: Insufficient documentation

## 2013-01-14 DIAGNOSIS — E119 Type 2 diabetes mellitus without complications: Secondary | ICD-10-CM | POA: Insufficient documentation

## 2013-01-14 DIAGNOSIS — S46912A Strain of unspecified muscle, fascia and tendon at shoulder and upper arm level, left arm, initial encounter: Secondary | ICD-10-CM

## 2013-01-14 DIAGNOSIS — Y9289 Other specified places as the place of occurrence of the external cause: Secondary | ICD-10-CM | POA: Insufficient documentation

## 2013-01-14 DIAGNOSIS — Y990 Civilian activity done for income or pay: Secondary | ICD-10-CM | POA: Insufficient documentation

## 2013-01-14 DIAGNOSIS — X500XXA Overexertion from strenuous movement or load, initial encounter: Secondary | ICD-10-CM | POA: Insufficient documentation

## 2013-01-14 DIAGNOSIS — Z9889 Other specified postprocedural states: Secondary | ICD-10-CM | POA: Insufficient documentation

## 2013-01-14 HISTORY — DX: Type 2 diabetes mellitus without complications: E11.9

## 2013-01-14 MED ORDER — OXYCODONE-ACETAMINOPHEN 5-325 MG PO TABS: 2.0000 | ORAL_TABLET | ORAL | Status: DC | PRN

## 2013-01-14 MED ORDER — OXYCODONE-ACETAMINOPHEN 5-325 MG PO TABS
2.0000 | ORAL_TABLET | Freq: Once | ORAL | Status: AC
  Administered 2013-01-14: 2 via ORAL
  Filled 2013-01-14: qty 2

## 2013-01-14 NOTE — ED Notes (Signed)
Pt discharged.Vital signs stable and GCS 15 

## 2013-01-14 NOTE — ED Notes (Signed)
Pt presented to ED with left arm injury(more the shoulder).Pt was at work and was lifting a bucket and felt a numbness in his left arm and not been able to move.Pt complains of sever tenderness on movement.

## 2013-01-14 NOTE — ED Provider Notes (Signed)
History     CSN: [id number]  Arrival date & time 01/14/13  0321   First MD Initiated Contact with Patient 01/14/13 0354      Chief Complaint  Patient presents with  . Arm Injury    (Consider location/radiation/quality/duration/timing/severity/associated sxs/prior treatment) The history is provided by the patient.  Jeremiah [name] is a 52 y.o. male history of diabetes, bilateral rotator cuff injury status post surgeries, here presenting with left shoulder pain. He was lifting a bucket at work today and also in felt sharp pain in the left shoulder. He initially felt numb and then afterwards was painful. He said he cannot move his arm that well after this incident. Denies any weakness in the arm. Denies any trauma.    Past Medical History  Diagnosis Date  . Diabetes mellitus without complication     No past surgical history on file.  No family history on file.  History  Substance Use Topics  . Smoking status: Not on file  . Smokeless tobacco: Not on file  . Alcohol Use: Not on file      Review of Systems  Musculoskeletal:       L shoulder pain   All other systems reviewed and are negative.    Allergies  Tequin and Amoxicillin  Home Medications   Current Outpatient Rx  Name  Route  Sig  Dispense  Refill  . amLODipine (NORVASC) 10 MG tablet   Oral   Take 5 mg by mouth daily.         [name] azelastine (ASTELIN) 137 MCG/SPRAY nasal spray   Nasal   Place 1 spray into the nose 2 (two) times daily. Use in each nostril as directed         . buPROPion (WELLBUTRIN XL) 300 MG 24 hr tablet   Oral   Take 300 mg by mouth daily.         [name] levothyroxine (SYNTHROID, LEVOTHROID) 50 MCG tablet   Oral   Take 50 mcg by mouth daily before breakfast.         . lithium carbonate 300 MG capsule   Oral   Take 600 mg by mouth 2 (two) times daily with a meal.         . loratadine (CLARITIN) 10 MG tablet   Oral   Take 10 mg by mouth daily.         [name] lurasidone (LATUDA)  40 MG TABS   Oral   Take 40 mg by mouth daily with breakfast.         . meloxicam (MOBIC) 15 MG tablet   Oral   Take 15 mg by mouth daily.         [name] PRESCRIPTION MEDICATION   Intramuscular   Inject 1 Applicatorful into the muscle once a week. Allergy Shots         . tadalafil (CIALIS) 10 MG tablet   Oral   Take 10 mg by mouth daily as needed for erectile dysfunction.         . tamsulosin (FLOMAX) 0.4 MG CAPS   Oral   Take by mouth daily.           BP 138/94  Pulse 80  Temp(Src) 98.2 F (36.8 C) (Oral)  Resp 20  SpO2 96%  Physical Exam  Nursing note and vitals reviewed. Constitutional: He is oriented to person, place, and time. He appears well-developed and well-nourished.  Uncomfortable   HENT:  Head: Normocephalic.  Mouth/Throat: Oropharynx is  clear and moist.  Eyes: Conjunctivae are normal. Pupils are equal, round, and reactive to light.  Neck: Normal range of motion. Neck supple.  Cardiovascular: Normal rate, regular rhythm and normal heart sounds.   Pulmonary/Chest: Effort normal and breath sounds normal. No respiratory distress. He has no wheezes. He has no rales.  Abdominal: Soft. Bowel sounds are normal. He exhibits no distension. There is no tenderness.  Musculoskeletal:  Bilateral shoulder scars from previous rotator cuff surgeries. L shoulder slightly swollen, dec ROM from pain. ? Tenderness on bicep tendon. L elbow and wrist nl ROM. 2+ pulses   Neurological: He is alert and oriented to person, place, and time.  Skin: Skin is warm and dry.  Psychiatric: He has a normal mood and affect. His behavior is normal. Judgment and thought content normal.    ED Course  Procedures (including critical care time)  Labs Reviewed - No data to display Dg Shoulder Left  01/14/2013  *RADIOLOGY REPORT*  Clinical Data: Pop and pain at left shoulder.  History of left shoulder surgeries.  LEFT SHOULDER - 2+ VIEW  Comparison: None.  Findings: There is no evidence of  fracture or dislocation.  The left humeral head is seated within the glenoid fossa.  Mild degenerative change is noted at the glenoid fossa.  There is mild degenerative change at the left acromioclavicular joint.  No significant soft tissue abnormalities are seen.  The visualized portions of the left lung are clear.  IMPRESSION: No evidence of fracture or dislocation.   Original Report Authenticated By: Tonia Ghent, M.D.      No diagnosis found.    MDM  Jeremiah [name] is a 52 y.o. male here with L shoulder pain. Likely rotator injury vs bicep tendon injury. Will get shoulder xray and give pain meds. I told him that he will need to see his orthopedic doctor for outpatient MRI.   5:16 AM Xray showed no acute fracture. Likely ligamentous injury. Will give sling for comfort. He has orthopedic f/u for MRI. Will give short course of percocet.        Richardean Canal, MD 01/14/13 [phone]

## 2013-02-11 ENCOUNTER — Encounter (HOSPITAL_COMMUNITY): Payer: Self-pay | Admitting: Pharmacy Technician

## 2013-02-12 ENCOUNTER — Encounter (HOSPITAL_COMMUNITY): Payer: Self-pay

## 2013-02-12 ENCOUNTER — Encounter (HOSPITAL_COMMUNITY)
Admission: RE | Admit: 2013-02-12 | Discharge: 2013-02-12 | Disposition: A | Discharge status: Home / Self Care | Payer: Worker's Compensation | Source: Ambulatory Visit | Attending: Orthopedic Surgery | Admitting: Orthopedic Surgery

## 2013-02-12 DIAGNOSIS — K449 Diaphragmatic hernia without obstruction or gangrene: Secondary | ICD-10-CM | POA: Insufficient documentation

## 2013-02-12 DIAGNOSIS — I10 Essential (primary) hypertension: Secondary | ICD-10-CM | POA: Insufficient documentation

## 2013-02-12 DIAGNOSIS — G4730 Sleep apnea, unspecified: Secondary | ICD-10-CM | POA: Diagnosis not present

## 2013-02-12 DIAGNOSIS — E119 Type 2 diabetes mellitus without complications: Secondary | ICD-10-CM | POA: Insufficient documentation

## 2013-02-12 DIAGNOSIS — Z01810 Encounter for preprocedural cardiovascular examination: Secondary | ICD-10-CM | POA: Insufficient documentation

## 2013-02-12 DIAGNOSIS — Z01818 Encounter for other preprocedural examination: Secondary | ICD-10-CM | POA: Insufficient documentation

## 2013-02-12 DIAGNOSIS — X58XXXA Exposure to other specified factors, initial encounter: Secondary | ICD-10-CM | POA: Diagnosis not present

## 2013-02-12 DIAGNOSIS — Z01812 Encounter for preprocedural laboratory examination: Secondary | ICD-10-CM | POA: Insufficient documentation

## 2013-02-12 DIAGNOSIS — S43429A Sprain of unspecified rotator cuff capsule, initial encounter: Secondary | ICD-10-CM | POA: Insufficient documentation

## 2013-02-12 HISTORY — DX: Major depressive disorder, single episode, unspecified: F32.9

## 2013-02-12 HISTORY — DX: Depression, unspecified: F32.A

## 2013-02-12 HISTORY — DX: Anxiety disorder, unspecified: F41.9

## 2013-02-12 HISTORY — DX: Hypothyroidism, unspecified: E03.9

## 2013-02-12 HISTORY — DX: Sleep apnea, unspecified: G47.30

## 2013-02-12 HISTORY — DX: Unspecified osteoarthritis, unspecified site: M19.90

## 2013-02-12 HISTORY — DX: Personal history of other diseases of the digestive system: Z87.19

## 2013-02-12 HISTORY — DX: Essential (primary) hypertension: I10

## 2013-02-12 LAB — CBC
HCT: 40.3 % (ref 39.0–52.0)
Hemoglobin: 14.2 g/dL (ref 13.0–17.0)
MCH: 30.9 pg (ref 26.0–34.0)
MCHC: 35.2 g/dL (ref 30.0–36.0)
MCV: 87.6 fL (ref 78.0–100.0)
Platelets: 194 10*3/uL (ref 150–400)
RBC: 4.6 MIL/uL (ref 4.22–5.81)
RDW: 13.8 % (ref 11.5–15.5)
WBC: 6.9 10*3/uL (ref 4.0–10.5)

## 2013-02-12 LAB — COMPREHENSIVE METABOLIC PANEL
ALT: 20 U/L (ref 0–53)
AST: 20 U/L (ref 0–37)
Albumin: 4 g/dL (ref 3.5–5.2)
Alkaline Phosphatase: 47 U/L (ref 39–117)
BUN: 16 mg/dL (ref 6–23)
CO2: 24 mEq/L (ref 19–32)
Calcium: 10.2 mg/dL (ref 8.4–10.5)
Chloride: 106 mEq/L (ref 96–112)
Creatinine, Ser: 1.18 mg/dL (ref 0.50–1.35)
GFR calc Af Amer: 79 mL/min — ABNORMAL LOW (ref >90)
GFR calc non Af Amer: 68 mL/min — ABNORMAL LOW (ref >90)
Glucose, Bld: 102 mg/dL — ABNORMAL HIGH (ref 70–99)
Potassium: 4.7 mEq/L (ref 3.5–5.1)
Sodium: 137 mEq/L (ref 135–145)
Total Bilirubin: 0.7 mg/dL (ref 0.3–1.2)
Total Protein: 7.2 g/dL (ref 6.0–8.3)

## 2013-02-12 LAB — SURGICAL PCR SCREEN
MRSA, PCR: NEGATIVE
Staphylococcus aureus: NEGATIVE

## 2013-02-12 NOTE — Progress Notes (Signed)
Have requested sleep study results from the [place] in [place] (was done approx. 1.5 yrs ago and he does not know the setting)  DA

## 2013-02-12 NOTE — Pre-Procedure Instructions (Signed)
Wess Baney  02/12/2013   Your procedure is scheduled on: Tuesday, MAY 13TH  Report to [facility] at  11:30 AM.             (COME THROUGH ENTRANCE 'A'..follow the signs to [facility] and take them up to 3 rd floor)  Call this number if you have problems the morning of surgery: [phone]   Remember:   Do not eat food or drink liquids after midnight Monday.   Take these medicines the morning of surgery with A SIP OF WATER: Norvasc, astelin, wellbutrin, synthroid, pain pill, flomax   Do not wear jewelry.  Do not wear lotions, powders, or colognes. You may NOT wear deodorant.   Men may shave face and neck.   Do not bring valuables to the hospital.  Contacts, dentures or bridgework may not be worn into surgery.   Leave suitcase in the car. After surgery it may be brought to your room.  For patients admitted to the hospital, checkout time is 11:00 AM the day of discharge.   Patients discharged the day of surgery will not be allowed to drive home.   Name and phone number of your driver: [name]  [phone]   Special Instructions: Shower using CHG 2 nights before surgery and the night before surgery.  If you shower the day of surgery use CHG.  Use special wash - you have one bottle of CHG for all showers.  You should use approximately 1/3 of the bottle for each shower.   Please read over the following fact sheets that you were given: Pain Booklet, Coughing and Deep Breathing, MRSA Information and Surgical Site Infection Prevention

## 2013-03-02 ENCOUNTER — Encounter (HOSPITAL_COMMUNITY)
Admission: RE | Admit: 2013-03-02 | Discharge: 2013-03-02 | Disposition: A | Discharge status: Home / Self Care | Payer: Worker's Compensation | Source: Ambulatory Visit | Attending: Orthopedic Surgery | Admitting: Orthopedic Surgery

## 2013-03-02 LAB — BASIC METABOLIC PANEL
BUN: 15 mg/dL (ref 6–23)
CO2: 19 mEq/L (ref 19–32)
Calcium: 9.6 mg/dL (ref 8.4–10.5)
Chloride: 103 mEq/L (ref 96–112)
Creatinine, Ser: 1.25 mg/dL (ref 0.50–1.35)
GFR calc Af Amer: 74 mL/min — ABNORMAL LOW (ref >90)
GFR calc non Af Amer: 64 mL/min — ABNORMAL LOW (ref >90)
Glucose, Bld: 78 mg/dL (ref 70–99)
Potassium: 4.5 mEq/L (ref 3.5–5.1)
Sodium: 134 mEq/L — ABNORMAL LOW (ref 135–145)

## 2013-03-02 LAB — CBC
HCT: 41.9 % (ref 39.0–52.0)
Hemoglobin: 14.7 g/dL (ref 13.0–17.0)
MCH: 30.8 pg (ref 26.0–34.0)
MCHC: 35.1 g/dL (ref 30.0–36.0)
MCV: 87.7 fL (ref 78.0–100.0)
Platelets: 222 10*3/uL (ref 150–400)
RBC: 4.78 MIL/uL (ref 4.22–5.81)
RDW: 13.7 % (ref 11.5–15.5)
WBC: 7.7 10*3/uL (ref 4.0–10.5)

## 2013-03-02 NOTE — Pre-Procedure Instructions (Signed)
Jeremiah [name]  03/02/2013   Your procedure is scheduled on: Tuesday, MAY 29TH  Report to [facility] at  1010 AM.             (COME THROUGH ENTRANCE 'A'..follow the signs to [facility] and take them up to 3 rd floor)  Call this number if you have problems the morning of surgery: [phone]   Remember:   Do not eat food or drink liquids after midnight Monday.   Take these medicines the morning of surgery with A SIP OF WATER: Norvasc, astelin, wellbutrin, synthroid, pain pill, flomax   Do not wear jewelry.  Do not wear lotions, powders, or colognes. You may NOT wear deodorant.   Men may shave face and neck.   Do not bring valuables to the hospital.  Contacts, dentures or bridgework may not be worn into surgery.   Leave suitcase in the car. After surgery it may be brought to your room.  For patients admitted to the hospital, checkout time is 11:00 AM the day of discharge.   Patients discharged the day of surgery will not be allowed to drive home.   Name and phone number of your driver: [name]  [phone]   Special Instructions: Shower using CHG 2 nights before surgery and the night before surgery.  If you shower the day of surgery use CHG.  Use special wash - you have one bottle of CHG for all showers.  You should use approximately 1/3 of the bottle for each shower.   Please read over the following fact sheets that you were given: Pain Booklet, Coughing and Deep Breathing, MRSA Information and Surgical Site Infection Prevention

## 2013-03-03 MED ORDER — DEXTROSE 5 % IV SOLN
3.0000 g | INTRAVENOUS | Status: AC
  Administered 2013-03-04: 3 g via INTRAVENOUS
  Filled 2013-03-03: qty 3000

## 2013-03-03 MED ORDER — CHLORHEXIDINE GLUCONATE 4 % EX LIQD: 60.0000 mL | Freq: Once | CUTANEOUS | Status: DC

## 2013-03-04 ENCOUNTER — Encounter (HOSPITAL_COMMUNITY): Payer: Self-pay | Admitting: Anesthesiology

## 2013-03-04 ENCOUNTER — Ambulatory Visit (HOSPITAL_COMMUNITY): Payer: Worker's Compensation | Admitting: Anesthesiology

## 2013-03-04 ENCOUNTER — Ambulatory Visit (HOSPITAL_COMMUNITY)
Admission: RE | Admit: 2013-03-04 | Discharge: 2013-03-04 | Disposition: A | Discharge status: Home / Self Care | Payer: Worker's Compensation | Source: Ambulatory Visit | Attending: Orthopedic Surgery | Admitting: Orthopedic Surgery

## 2013-03-04 ENCOUNTER — Encounter (HOSPITAL_COMMUNITY)
Admission: RE | Disposition: A | Discharge status: Home / Self Care | Payer: Self-pay | Source: Ambulatory Visit | Attending: Orthopedic Surgery

## 2013-03-04 DIAGNOSIS — S43429A Sprain of unspecified rotator cuff capsule, initial encounter: Secondary | ICD-10-CM | POA: Insufficient documentation

## 2013-03-04 DIAGNOSIS — Z01812 Encounter for preprocedural laboratory examination: Secondary | ICD-10-CM | POA: Insufficient documentation

## 2013-03-04 DIAGNOSIS — M7500 Adhesive capsulitis of unspecified shoulder: Secondary | ICD-10-CM | POA: Insufficient documentation

## 2013-03-04 DIAGNOSIS — Z791 Long term (current) use of non-steroidal anti-inflammatories (NSAID): Secondary | ICD-10-CM | POA: Insufficient documentation

## 2013-03-04 DIAGNOSIS — IMO0002 Reserved for concepts with insufficient information to code with codable children: Secondary | ICD-10-CM | POA: Insufficient documentation

## 2013-03-04 DIAGNOSIS — F329 Major depressive disorder, single episode, unspecified: Secondary | ICD-10-CM | POA: Insufficient documentation

## 2013-03-04 DIAGNOSIS — F411 Generalized anxiety disorder: Secondary | ICD-10-CM | POA: Insufficient documentation

## 2013-03-04 DIAGNOSIS — M25819 Other specified joint disorders, unspecified shoulder: Secondary | ICD-10-CM | POA: Insufficient documentation

## 2013-03-04 DIAGNOSIS — X58XXXA Exposure to other specified factors, initial encounter: Secondary | ICD-10-CM | POA: Insufficient documentation

## 2013-03-04 DIAGNOSIS — S43439A Superior glenoid labrum lesion of unspecified shoulder, initial encounter: Secondary | ICD-10-CM | POA: Insufficient documentation

## 2013-03-04 DIAGNOSIS — Z883 Allergy status to other anti-infective agents status: Secondary | ICD-10-CM | POA: Insufficient documentation

## 2013-03-04 DIAGNOSIS — M75100 Unspecified rotator cuff tear or rupture of unspecified shoulder, not specified as traumatic: Secondary | ICD-10-CM | POA: Insufficient documentation

## 2013-03-04 DIAGNOSIS — Z79899 Other long term (current) drug therapy: Secondary | ICD-10-CM | POA: Insufficient documentation

## 2013-03-04 DIAGNOSIS — Z880 Allergy status to penicillin: Secondary | ICD-10-CM | POA: Insufficient documentation

## 2013-03-04 DIAGNOSIS — G4730 Sleep apnea, unspecified: Secondary | ICD-10-CM | POA: Insufficient documentation

## 2013-03-04 DIAGNOSIS — F3289 Other specified depressive episodes: Secondary | ICD-10-CM | POA: Insufficient documentation

## 2013-03-04 DIAGNOSIS — E119 Type 2 diabetes mellitus without complications: Secondary | ICD-10-CM | POA: Insufficient documentation

## 2013-03-04 DIAGNOSIS — M1710 Unilateral primary osteoarthritis, unspecified knee: Secondary | ICD-10-CM | POA: Insufficient documentation

## 2013-03-04 DIAGNOSIS — E039 Hypothyroidism, unspecified: Secondary | ICD-10-CM | POA: Insufficient documentation

## 2013-03-04 DIAGNOSIS — I10 Essential (primary) hypertension: Secondary | ICD-10-CM | POA: Insufficient documentation

## 2013-03-04 HISTORY — PX: SHOULDER ARTHROSCOPY WITH ROTATOR CUFF REPAIR AND SUBACROMIAL DECOMPRESSION: SHX5686

## 2013-03-04 LAB — GLUCOSE, CAPILLARY
Glucose-Capillary: 137 mg/dL — ABNORMAL HIGH (ref 70–99)
Glucose-Capillary: 116 mg/dL — ABNORMAL HIGH (ref 70–99)

## 2013-03-04 SURGERY — SHOULDER ARTHROSCOPY WITH ROTATOR CUFF REPAIR AND SUBACROMIAL DECOMPRESSION
Anesthesia: General | Site: Shoulder | Laterality: Left | Wound class: Clean

## 2013-03-04 MED ORDER — HYDROMORPHONE HCL PF 1 MG/ML IJ SOLN: 0.2500 mg | INTRAMUSCULAR | Status: DC | PRN

## 2013-03-04 MED ORDER — LACTATED RINGERS IV SOLN: INTRAVENOUS | Status: DC

## 2013-03-04 MED ORDER — GLYCOPYRROLATE 0.2 MG/ML IJ SOLN
INTRAMUSCULAR | Status: DC | PRN
  Administered 2013-03-04: 0.4 mg via INTRAVENOUS

## 2013-03-04 MED ORDER — OXYCODONE HCL 5 MG PO TABS
5.0000 mg | ORAL_TABLET | Freq: Once | ORAL | Status: AC | PRN
  Administered 2013-03-04: 5 mg via ORAL

## 2013-03-04 MED ORDER — LACTATED RINGERS IV SOLN
INTRAVENOUS | Status: DC | PRN
  Administered 2013-03-04 (×2): via INTRAVENOUS

## 2013-03-04 MED ORDER — ROCURONIUM BROMIDE 100 MG/10ML IV SOLN
INTRAVENOUS | Status: DC | PRN
  Administered 2013-03-04: 50 mg via INTRAVENOUS

## 2013-03-04 MED ORDER — PHENYLEPHRINE HCL 10 MG/ML IJ SOLN
INTRAMUSCULAR | Status: DC | PRN
  Administered 2013-03-04 (×3): 40 ug via INTRAVENOUS

## 2013-03-04 MED ORDER — LACTATED RINGERS IV SOLN
INTRAVENOUS | Status: DC
  Administered 2013-03-04: 11:00:00 via INTRAVENOUS

## 2013-03-04 MED ORDER — CYCLOBENZAPRINE HCL 10 MG PO TABS: 10.0000 mg | ORAL_TABLET | Freq: Three times a day (TID) | ORAL | Status: DC | PRN

## 2013-03-04 MED ORDER — DEXAMETHASONE SODIUM PHOSPHATE 4 MG/ML IJ SOLN
INTRAMUSCULAR | Status: DC | PRN
  Administered 2013-03-04: 4 mg via INTRAVENOUS

## 2013-03-04 MED ORDER — FENTANYL CITRATE 0.05 MG/ML IJ SOLN
INTRAMUSCULAR | Status: AC
  Administered 2013-03-04: 50 ug
  Filled 2013-03-04: qty 2

## 2013-03-04 MED ORDER — FENTANYL CITRATE 0.05 MG/ML IJ SOLN
INTRAMUSCULAR | Status: DC | PRN
  Administered 2013-03-04: 50 ug via INTRAVENOUS

## 2013-03-04 MED ORDER — BUPIVACAINE-EPINEPHRINE PF 0.5-1:200000 % IJ SOLN
INTRAMUSCULAR | Status: DC | PRN
  Administered 2013-03-04: 30 mL

## 2013-03-04 MED ORDER — LIDOCAINE HCL (CARDIAC) 20 MG/ML IV SOLN
INTRAVENOUS | Status: DC | PRN
  Administered 2013-03-04: 90 mg via INTRAVENOUS

## 2013-03-04 MED ORDER — OXYCODONE HCL 5 MG/5ML PO SOLN: 5.0000 mg | Freq: Once | ORAL | Status: AC | PRN

## 2013-03-04 MED ORDER — ONDANSETRON HCL 4 MG/2ML IJ SOLN
INTRAMUSCULAR | Status: DC | PRN
  Administered 2013-03-04: 4 mg via INTRAVENOUS

## 2013-03-04 MED ORDER — MEPERIDINE HCL 25 MG/ML IJ SOLN: 6.2500 mg | INTRAMUSCULAR | Status: DC | PRN

## 2013-03-04 MED ORDER — MIDAZOLAM HCL 2 MG/2ML IJ SOLN
INTRAMUSCULAR | Status: AC
  Administered 2013-03-04: 1 mg
  Filled 2013-03-04: qty 2

## 2013-03-04 MED ORDER — TEMAZEPAM 30 MG PO CAPS: 30.0000 mg | ORAL_CAPSULE | Freq: Every evening | ORAL | Status: DC | PRN

## 2013-03-04 MED ORDER — PROPOFOL 10 MG/ML IV BOLUS
INTRAVENOUS | Status: DC | PRN
  Administered 2013-03-04: 200 mg via INTRAVENOUS

## 2013-03-04 MED ORDER — NEOSTIGMINE METHYLSULFATE 1 MG/ML IJ SOLN
INTRAMUSCULAR | Status: DC | PRN
  Administered 2013-03-04: 2.5 mg via INTRAVENOUS

## 2013-03-04 MED ORDER — HYDROMORPHONE HCL 2 MG PO TABS: 2.0000 mg | ORAL_TABLET | ORAL | Status: DC | PRN

## 2013-03-04 MED ORDER — PROMETHAZINE HCL 25 MG/ML IJ SOLN: 6.2500 mg | INTRAMUSCULAR | Status: DC | PRN

## 2013-03-04 MED ORDER — SODIUM CHLORIDE 0.9 % IR SOLN
Status: DC | PRN
  Administered 2013-03-04 (×2): 3000 mL

## 2013-03-04 MED ORDER — OXYCODONE HCL 5 MG PO TABS
ORAL_TABLET | ORAL | Status: AC
  Filled 2013-03-04: qty 1

## 2013-03-04 SURGICAL SUPPLY — 71 items
ANCH SUT 2 CRKSRW FT 14X4.5 (Anchor) ×1 IMPLANT
ANCH SUT SWLK 19.1X4.75 VT (Anchor) ×2 IMPLANT
ANCHOR PEEK 4.75X19.1 SWLK C (Anchor) ×2 IMPLANT
ANCHOR PEEK CORKSCREW 4.5 (Anchor) ×1 IMPLANT
BLADE CUTTER GATOR 3.5 (BLADE) ×2 IMPLANT
BLADE GREAT WHITE 4.2 (BLADE) ×2 IMPLANT
BLADE SURG 11 STRL SS (BLADE) ×2 IMPLANT
BOOTCOVER CLEANROOM LRG (PROTECTIVE WEAR) ×4 IMPLANT
BUR 3.5 LG SPHERICAL (BURR) IMPLANT
BUR OVAL 4.0 (BURR) ×2 IMPLANT
BURR 3.5 LG SPHERICAL (BURR)
CANISTER SUCT LVC 12 LTR MEDI- (MISCELLANEOUS) ×2 IMPLANT
CANNULA ACUFLEX KIT 5X76 (CANNULA) ×2 IMPLANT
CANNULA DRILOCK 5.0X75 (CANNULA) ×3 IMPLANT
CLOTH BEACON ORANGE TIMEOUT ST (SAFETY) ×2 IMPLANT
CLSR STERI-STRIP ANTIMIC 1/2X4 (GAUZE/BANDAGES/DRESSINGS) ×1 IMPLANT
CONNECTOR 5 IN 1 STRAIGHT STRL (MISCELLANEOUS) ×2 IMPLANT
DRAPE INCISE 23X17 IOBAN STRL (DRAPES) ×1
DRAPE INCISE 23X17 STRL (DRAPES) ×1 IMPLANT
DRAPE INCISE IOBAN 23X17 STRL (DRAPES) ×1 IMPLANT
DRAPE INCISE IOBAN 66X45 STRL (DRAPES) ×2 IMPLANT
DRAPE STERI 35X30 U-POUCH (DRAPES) ×2 IMPLANT
DRAPE SURG 17X11 SM STRL (DRAPES) ×2 IMPLANT
DRAPE U-SHAPE 47X51 STRL (DRAPES) ×2 IMPLANT
DRSG MEPILEX BORDER 4X8 (GAUZE/BANDAGES/DRESSINGS) ×1 IMPLANT
DRSG PAD ABDOMINAL 8X10 ST (GAUZE/BANDAGES/DRESSINGS) ×3 IMPLANT
DURAPREP 26ML APPLICATOR (WOUND CARE) ×4 IMPLANT
ELECT REM PT RETURN 9FT ADLT (ELECTROSURGICAL) ×2
ELECTRODE REM PT RTRN 9FT ADLT (ELECTROSURGICAL) ×1 IMPLANT
GLOVE BIO SURGEON STRL SZ7.5 (GLOVE) ×2 IMPLANT
GLOVE BIO SURGEON STRL SZ8 (GLOVE) ×2 IMPLANT
GLOVE BIOGEL M 6.5 STRL (GLOVE) ×1 IMPLANT
GLOVE BIOGEL PI IND STRL 6.5 (GLOVE) IMPLANT
GLOVE BIOGEL PI IND STRL 7.5 (GLOVE) IMPLANT
GLOVE BIOGEL PI INDICATOR 6.5 (GLOVE) ×3
GLOVE BIOGEL PI INDICATOR 7.5 (GLOVE) ×1
GLOVE ECLIPSE 7.0 STRL STRAW (GLOVE) ×1 IMPLANT
GLOVE EUDERMIC 7 POWDERFREE (GLOVE) ×2 IMPLANT
GLOVE SS BIOGEL STRL SZ 7.5 (GLOVE) ×1 IMPLANT
GLOVE SUPERSENSE BIOGEL SZ 7.5 (GLOVE) ×2
GOWN STRL NON-REIN LRG LVL3 (GOWN DISPOSABLE) ×3 IMPLANT
GOWN STRL REIN XL XLG (GOWN DISPOSABLE) ×8 IMPLANT
IV NS IRRIG 3000ML ARTHROMATIC (IV SOLUTION) ×5 IMPLANT
KIT BASIN OR (CUSTOM PROCEDURE TRAY) ×2 IMPLANT
KIT ROOM TURNOVER OR (KITS) ×2 IMPLANT
KIT SHOULDER TRACTION (DRAPES) ×2 IMPLANT
MANIFOLD NEPTUNE II (INSTRUMENTS) ×2 IMPLANT
NDL SPNL 18GX3.5 QUINCKE PK (NEEDLE) ×1 IMPLANT
NDL SUT 6 .5 CRC .975X.05 MAYO (NEEDLE) IMPLANT
NEEDLE MAYO TAPER (NEEDLE)
NEEDLE SPNL 18GX3.5 QUINCKE PK (NEEDLE) ×2 IMPLANT
NS IRRIG 1000ML POUR BTL (IV SOLUTION) ×2 IMPLANT
PACK SHOULDER (CUSTOM PROCEDURE TRAY) ×2 IMPLANT
PAD ARMBOARD 7.5X6 YLW CONV (MISCELLANEOUS) ×4 IMPLANT
SET ARTHROSCOPY TUBING (MISCELLANEOUS) ×2
SET ARTHROSCOPY TUBING LN (MISCELLANEOUS) ×1 IMPLANT
SLING ARM FOAM STRAP LRG (SOFTGOODS) ×2 IMPLANT
SLING ARM FOAM STRAP MED (SOFTGOODS) ×1 IMPLANT
SPONGE GAUZE 4X4 12PLY (GAUZE/BANDAGES/DRESSINGS) ×2 IMPLANT
SPONGE LAP 4X18 X RAY DECT (DISPOSABLE) ×2 IMPLANT
STRIP CLOSURE SKIN 1/2X4 (GAUZE/BANDAGES/DRESSINGS) ×2 IMPLANT
SUT MNCRL AB 3-0 PS2 18 (SUTURE) ×2 IMPLANT
SUT PDS AB 0 CT 36 (SUTURE) ×1 IMPLANT
SUT RETRIEVER GRASP 30 DEG (SUTURE) ×1 IMPLANT
SYR 20CC LL (SYRINGE) ×2 IMPLANT
TAPE PAPER 2X10 WHT MICROPORE (GAUZE/BANDAGES/DRESSINGS) ×1 IMPLANT
TAPE PAPER 3X10 WHT MICROPORE (GAUZE/BANDAGES/DRESSINGS) ×2 IMPLANT
TOWEL OR 17X24 6PK STRL BLUE (TOWEL DISPOSABLE) ×2 IMPLANT
TOWEL OR 17X26 10 PK STRL BLUE (TOWEL DISPOSABLE) ×2 IMPLANT
WAND SUCTION MAX 4MM 90S (SURGICAL WAND) ×2 IMPLANT
WATER STERILE IRR 1000ML POUR (IV SOLUTION) ×2 IMPLANT

## 2013-03-04 NOTE — H&P (Signed)
Jeremiah [name]    Chief Complaint: LEFT SHOULDER RCT AND IMPINGEMENT  HPI: The patient is a 52 y.o. male with massive retracted left rotator cuff tear  Past Medical History  Diagnosis Date  . Hypertension   . Sleep apnea     dx 1.5 yr ago...study @ [facility] [place]  . Arthritis     bil knees  . Hypothyroidism   . Anxiety   . Depression     dx 2004  . H/O hiatal hernia     had surgery for that 2012  [facility]  . Diabetes mellitus without complication     type ll  controlled by weight & diet    Past Surgical History  Procedure Laterality Date  . Hernia repair      Left inguinal  . Rotator cuff repair      x 3  on right shoulder  . Shoulder surgery      x 3 on left  . Mandible surgery      1983  . Foot surgery      right foot 2012  . Appendectomy      No family history on file.  Social History:  reports that he has never smoked. He does not have any smokeless tobacco history on file. He reports that he does not drink alcohol or use illicit drugs.  Allergies:  Allergies  Allergen Reactions  . Tequin (Gatifloxacin) Anaphylaxis  . Amoxicillin Hives    Medications Prior to Admission  Medication Sig Dispense Refill  . amLODipine (NORVASC) 10 MG tablet Take 5 mg by mouth daily.      [name] azelastine (ASTELIN) 137 MCG/SPRAY nasal spray Place 1 spray into the nose 2 (two) times daily. Use in each nostril as directed      . buPROPion (WELLBUTRIN XL) 300 MG 24 hr tablet Take 300 mg by mouth daily.      [name] levothyroxine (SYNTHROID, LEVOTHROID) 50 MCG tablet Take 50 mcg by mouth daily before breakfast.      . lithium carbonate 300 MG capsule Take 600 mg by mouth 2 (two) times daily with a meal.      . loratadine (CLARITIN) 10 MG tablet Take 10 mg by mouth daily.      [name] lurasidone (LATUDA) 40 MG TABS Take 40 mg by mouth daily with breakfast.      . meloxicam (MOBIC) 15 MG tablet Take 15 mg by mouth daily.      . Multiple Vitamin (MULTIVITAMIN WITH MINERALS) TABS Take 1 tablet by mouth  daily.      [name] oxyCODONE-acetaminophen (PERCOCET) 5-325 MG per tablet Take 2 tablets by mouth every 4 (four) hours as needed for pain.  15 tablet  0  . PRESCRIPTION MEDICATION Inject 1 Applicatorful into the muscle once a week. Allergy Shots      . tadalafil (CIALIS) 10 MG tablet Take 10 mg by mouth daily as needed for erectile dysfunction.      . tamsulosin (FLOMAX) 0.4 MG CAPS Take 0.4 mg by mouth daily.       [name] testosterone (ANDRODERM) 5 MG/24HR Place 1 patch onto the skin daily.         Physical Exam: left shoulder with painful and restricted motion as noted at recent office visit  Vitals  Temp:  [97.2 F (36.2 C)] 97.2 F (36.2 C) (05/29 1007) Pulse Rate:  [58-78] 78 (05/29 1155) Resp:  [11-17] 17 (05/29 1155) BP: (117-132)/(81-86) 117/81 mmHg (05/29 1135) SpO2:  [97 %-100 %]  99 % (05/29 1155)  Assessment/Plan  Impression: LEFT SHOULDER RCT AND IMPINGEMENT   Plan of Action: Procedure(s): LEFT SHOULDER ARTHROSCOPY WITH ROTATOR CUFF REPAIR AND SUBACROMIAL DECOMPRESSION  Verity Gilcrest M 03/04/2013, 12:08 PM

## 2013-03-04 NOTE — Transfer of Care (Signed)
Immediate Anesthesia Transfer of Care Note  Patient: Jeremiah [name]  Procedure(s) Performed: Procedure(s): LEFT SHOULDER ARTHROSCOPY WITH ROTATOR CUFF REPAIR AND SUBACROMIAL DECOMPRESSION (Left)  Patient Location: PACU  Anesthesia Type:GA combined with regional for post-op pain  Level of Consciousness: awake, alert  and oriented  Airway & Oxygen Therapy: Patient Spontanous Breathing and Patient connected to face mask oxygen  Post-op Assessment: Report given to PACU RN  Post vital signs: Reviewed and stable  Complications: No apparent anesthesia complications

## 2013-03-04 NOTE — Op Note (Signed)
03/04/2013  2:42 PM  PATIENT:   Jeremiah [name]  52 y.o. male  PRE-OPERATIVE DIAGNOSIS:  LEFT SHOULDER Rotator Cuff Tear  AND IMPINGEMENT   POST-OPERATIVE DIAGNOSIS:  Same with labral tear and adhesive burisitis  PROCEDURE:  LSA, labral debridement, sub acromial bursectomy and lysis of adhesions, rotator cuff repair  SURGEON:  Jaret Coppedge, Vania Rea M.D.  ASSISTANTS: Shuford pac   ANESTHESIA:   GET + ISB  EBL: min  SPECIMEN:  none  Drains: none   PATIENT DISPOSITION:  PACU - hemodynamically stable.    PLAN OF CARE: Discharge to home after PACU  Dictation# [id number]

## 2013-03-04 NOTE — Anesthesia Procedure Notes (Addendum)
Anesthesia Regional Block:  Interscalene brachial plexus block  Pre-Anesthetic Checklist: ,, timeout performed, Correct Patient, Correct Site, Correct Laterality, Correct Procedure, Correct Position, site marked, Risks and benefits discussed, at surgeon's request and post-op pain management  Laterality: Upper and Left  Prep: chloraprep and alcohol swabs       Needles:  Injection technique: Single-shot  Needle Type: Stimulator Needle - 40      Needle Gauge: 22 and 22 G  Needle insertion depth: 4 cm   Additional Needles:  Procedures: Doppler guided and nerve stimulator Interscalene brachial plexus block  Nerve Stimulator or Paresthesia:  Response: Twitch elicited, 0.5 mA, 0.3 ms,   Additional Responses:   Narrative:  Start time: 03/04/2013 11:40 AM End time: 03/04/2013 11:55 AM Injection made incrementally with aspirations every 5 mL.  Performed by: Personally  Anesthesiologist: Alma Friendly, MD  Additional Notes: Tolerated well. Block assessed prior to start of surgery.  Interscalene brachial plexus block Procedure Name: Intubation Date/Time: 03/04/2013 12:49 PM Performed by: Sharlene Dory E Pre-anesthesia Checklist: Patient identified, Emergency Drugs available, Suction available, Patient being monitored and Timeout performed Patient Re-evaluated:Patient Re-evaluated prior to inductionOxygen Delivery Method: Circle system utilized Preoxygenation: Pre-oxygenation with 100% oxygen Intubation Type: IV induction Ventilation: Mask ventilation without difficulty Laryngoscope Size: Mac and 4 Grade View: Grade II Tube type: Oral Tube size: 7.5 mm Number of attempts: 1 Airway Equipment and Method: Stylet Placement Confirmation: ETT inserted through vocal cords under direct vision,  positive ETCO2 and breath sounds checked- equal and bilateral Secured at: 22 cm Tube secured with: Tape Dental Injury: Teeth and Oropharynx as per pre-operative assessment

## 2013-03-04 NOTE — Anesthesia Preprocedure Evaluation (Addendum)
Anesthesia Evaluation  Patient identified by MRN, date of birth, ID band Patient awake    Reviewed: Allergy & Precautions, H&P , NPO status   History of Anesthesia Complications Negative for: history of anesthetic complications  Airway Mallampati: II  Neck ROM: Full    Dental  (+) Teeth Intact   Pulmonary sleep apnea ,  breath sounds clear to auscultation        Cardiovascular hypertension, + Peripheral Vascular Disease Rhythm:Regular Rate:Normal     Neuro/Psych    GI/Hepatic hiatal hernia, PUD,   Endo/Other  diabetes  Renal/GU      Musculoskeletal  (+) Arthritis -,   Abdominal   Peds  Hematology   Anesthesia Other Findings   Reproductive/Obstetrics                          Anesthesia Physical Anesthesia Plan  ASA: III  Anesthesia Plan: General   Post-op Pain Management:    Induction: Intravenous  Airway Management Planned: Oral ETT  Additional Equipment:   Intra-op Plan:   Post-operative Plan: Extubation in OR  Informed Consent: I have reviewed the patients History and Physical, chart, labs and discussed the procedure including the risks, benefits and alternatives for the proposed anesthesia with the patient or authorized representative who has indicated his/her understanding and acceptance.   Dental advisory given  Plan Discussed with: CRNA and Surgeon  Anesthesia Plan Comments:         Anesthesia Quick Evaluation

## 2013-03-04 NOTE — Preoperative (Signed)
Beta Blockers   Reason not to administer Beta Blockers:Not Applicable 

## 2013-03-04 NOTE — Anesthesia Postprocedure Evaluation (Signed)
  Anesthesia Post-op Note  Patient: Jeremiah [name]  Procedure(s) Performed: Procedure(s): LEFT SHOULDER ARTHROSCOPY WITH ROTATOR CUFF REPAIR AND SUBACROMIAL DECOMPRESSION (Left)  Patient Location: PACU  Anesthesia Type:GA combined with regional for post-op pain  Level of Consciousness: awake, alert , oriented and patient cooperative  Airway and Oxygen Therapy: Patient Spontanous Breathing  Post-op Pain: mild  Post-op Assessment: Post-op Vital signs reviewed, Patient's Cardiovascular Status Stable, Respiratory Function Stable, Patent Airway, No signs of Nausea or vomiting and Pain level controlled  Post-op Vital Signs: stable  Complications: No apparent anesthesia complications

## 2013-03-05 ENCOUNTER — Encounter (HOSPITAL_COMMUNITY): Payer: Self-pay | Admitting: Orthopedic Surgery

## 2013-03-05 NOTE — Op Note (Signed)
NAME:  Jeremiah [name], Jeremiah [name] NO.:  [id number]  MEDICAL RECORD NO.:  [id number]  LOCATION:  [facility]                         FACILITY:  [facility]  PHYSICIAN:  Vania Rea. Shaquisha Wynn, M.D.  DATE OF BIRTH:  1960/12/26  DATE OF PROCEDURE:  03/04/2013 DATE OF DISCHARGE:  03/04/2013                              OPERATIVE REPORT   PREOPERATIVE DIAGNOSIS:  Full-thickness and retracted probable acute-on- chronic rotator cuff tear.  POSTOPERATIVE DIAGNOSES: 1. Full-thickness and retracted probable acute-on-chronic rotator cuff     tear. 2. Subacromial bursitis with adhesive capsulitis. 3. Extensive labral tear.  PROCEDURES: 1. Left shoulder examination under anesthesia. 2. Left shoulder diagnostic arthroscopy. 3. Debridement of complex and extensive labral tear. 4. Arthroscopic subacromial decompression with bursectomy and lysis of     adhesions. 5. Arthroscopic rotator cuff repair using a double-row suture bridge     repair construct.  SURGEON:  Vania Rea. Amrutha Avera, M.D.  Threasa HeadsFrench Ana A. Shuford, P.A.-C.  ANESTHESIA:  General endotracheal as well as an interscalene block.  ESTIMATED BLOOD LOSS:  Minimal.  DRAINS:  None.  HISTORY:  Jeremiah [name] is a 52 year old gentleman who sustained a work- related injury to his left shoulder earlier this year.  He has had continued difficulties with pain, weakness, and functional limitations refractory to prolonged attempts at conservative management.  Of note, he has had three previous left shoulder surgeries, all open, including rotator cuff repair, but does state that his shoulder had been asymptomatic and functioning without restriction prior to his recent work-related injury.  His preoperative MRI scan does show a large retracted tear of the rotator cuff with fatty infiltration and atrophy of the supraspinatus.  Due to his ongoing pain and functional limitations, he was brought to the operating room at this time for planned left shoulder  arthroscopy with planned rotator repair as described below.  I preoperatively counseled Jeremiah [name] on treatment options as well as risks versus benefits thereof.  Possible surgical complications were reviewed including potential for bleeding, infection, neurovascular injury, persistent pain, loss of motion, anesthetic complication, recurrence of rotator cuff tear, anesthetic complication, and possible need for additional surgery.  He understands and accepts and agrees to the planned procedure.  PROCEDURE IN DETAIL:  After undergoing routine preop evaluation, the patient received prophylactic antibiotics and an interscalene block was established in the holding area by the Anesthesia Department.  Placed supine on the operating table, underwent smooth induction of a general endotracheal anesthesia.  Turned to the right lateral decubitus position on a beanbag and appropriately padded and protected.  Left shoulder examination under anesthesia revealed full motion and no obvious instability patterns, although mobility was somewhat at the lower limits of normal.  Left arm was then suspended at 70 degrees abduction with 10 pounds of traction.  Left shoulder girdle region was sterilely prepped and draped in standard fashion.  Time-out was called.  A posterior portal was established in the glenohumeral joint and anterior portal was established under direct visualization.  There was a complex and extensive tear of the anterior and superior labrum, which was debrided back to stable margin with the shaver.  There was also partial tearing of biceps tendon  with significant fraying distally and this was debrided with shaver and I would estimate was slightly less than 50% of thickness of the tendon and it did not appear to be instability of the biceps. So, we simply performed debridement.  The rotator cuff had an obvious large retracted tear involving the majority of the supra and infraspinatus with a  V-shaped pattern and the apex almost back to the glenoid.  This was debrided and mobilized from the articular side. Articular surfaces were in relatively good condition.  Fluid and instruments were then removed.  The arm was dropped down to 30 degrees of abduction.  The arthroscope was introduced in the subacromial space of the posterior portal and direct lateral portal was established in the subacromial space.  Abundant dense bursal tissue and multiple adhesions were encountered and these were all divided and excised with a combination of shaver and Stryker wand.  Of note, the adhesions were so dense that this appeared to be adhesive bursitis and likely much of the scarring was laid his prior surgeries.  Very dense bands of tissue adherent to the rotator cuff were divided and excised both anteriorly and posteriorly.  We were ultimately able to gain visualization of the rotator cuff and this did indeed show a very large tear, V-shaped with apex back to the glenoid.  However, did find muscle tissue that was contractile on electrical stimulation and given these findings along with reasonable elasticity tissues, we decided to proceed with a repair. We again mobilized the rotator cuff on both the bursal and articular sides.  I began with a convergence stitch at the apex of the tear, passed in a horizontal mattress pattern using a #2 FiberWire and this nicely closed down the V-shaped defect and left [place] with a footprint of the tuberosity approximately 2 cm in width.  The tuberosity was then prepared gently abrading the bone with the bur.  Through the stab wound on the lateral margin of the acromion, placed an Arthrex PEEK corkscrew suture anchor.  The limbs of the suture anchor were then shuttled through the free margin of the rotator cuff, two suture limbs of the anterior leaflet, two through the posterior leaflet in a horizontal mattress pattern.  These were then tied with sliding locking  knots followed by multiple overhand throws and alternating posts.  We then created "suture bridge" with two swivel lock suture anchors nicely reinforcing the repair and compressing the margin of the rotator cuff against the bony bed of the tuberosity and the overall repair construct was much to our satisfaction.  Suture limbs were all clipped.  We obtained hemostasis throughout the subacromial/subdeltoid bursal region.  Ralene Bathe, PA-C was used as an Geophysicist/field seismologist throughout this case essential for help with positioning of the extremity, management of the arthroscopic equipment, tissue manipulation, suture management, wound closer and intraoperative decision making.  The portals were closed with Monocryl and Steri-Strips.  Dry dressing taped at the left shoulder. Left arm was placed in a sling.  The patient was then awakened, extubated, and taken to the recovery room in stable condition.     Vania Rea. Shallen Luedke, M.D.     KMS/MEDQ  D:  03/04/2013  T:  03/05/2013  Job:  [id number]

## 2013-05-31 DIAGNOSIS — M1991 Primary osteoarthritis, unspecified site: Secondary | ICD-10-CM | POA: Insufficient documentation

## 2013-08-31 DIAGNOSIS — E119 Type 2 diabetes mellitus without complications: Secondary | ICD-10-CM | POA: Insufficient documentation

## 2013-08-31 DIAGNOSIS — I10 Essential (primary) hypertension: Secondary | ICD-10-CM | POA: Insufficient documentation

## 2013-08-31 DIAGNOSIS — F329 Major depressive disorder, single episode, unspecified: Secondary | ICD-10-CM | POA: Insufficient documentation

## 2013-08-31 DIAGNOSIS — G4733 Obstructive sleep apnea (adult) (pediatric): Secondary | ICD-10-CM | POA: Insufficient documentation

## 2013-08-31 DIAGNOSIS — F32A Depression, unspecified: Secondary | ICD-10-CM | POA: Insufficient documentation

## 2013-08-31 DIAGNOSIS — E039 Hypothyroidism, unspecified: Secondary | ICD-10-CM | POA: Insufficient documentation

## 2013-09-14 IMAGING — CR DG CHEST 2V
2 series · 2 of 2 positions shown · non-contrast
Comparison: 11/28/2008.

CLINICAL DATA: Left rotator cuff tear.  Preoperative radiograph.

CHEST - 2 VIEW

[view not recorded (1 of 2)]
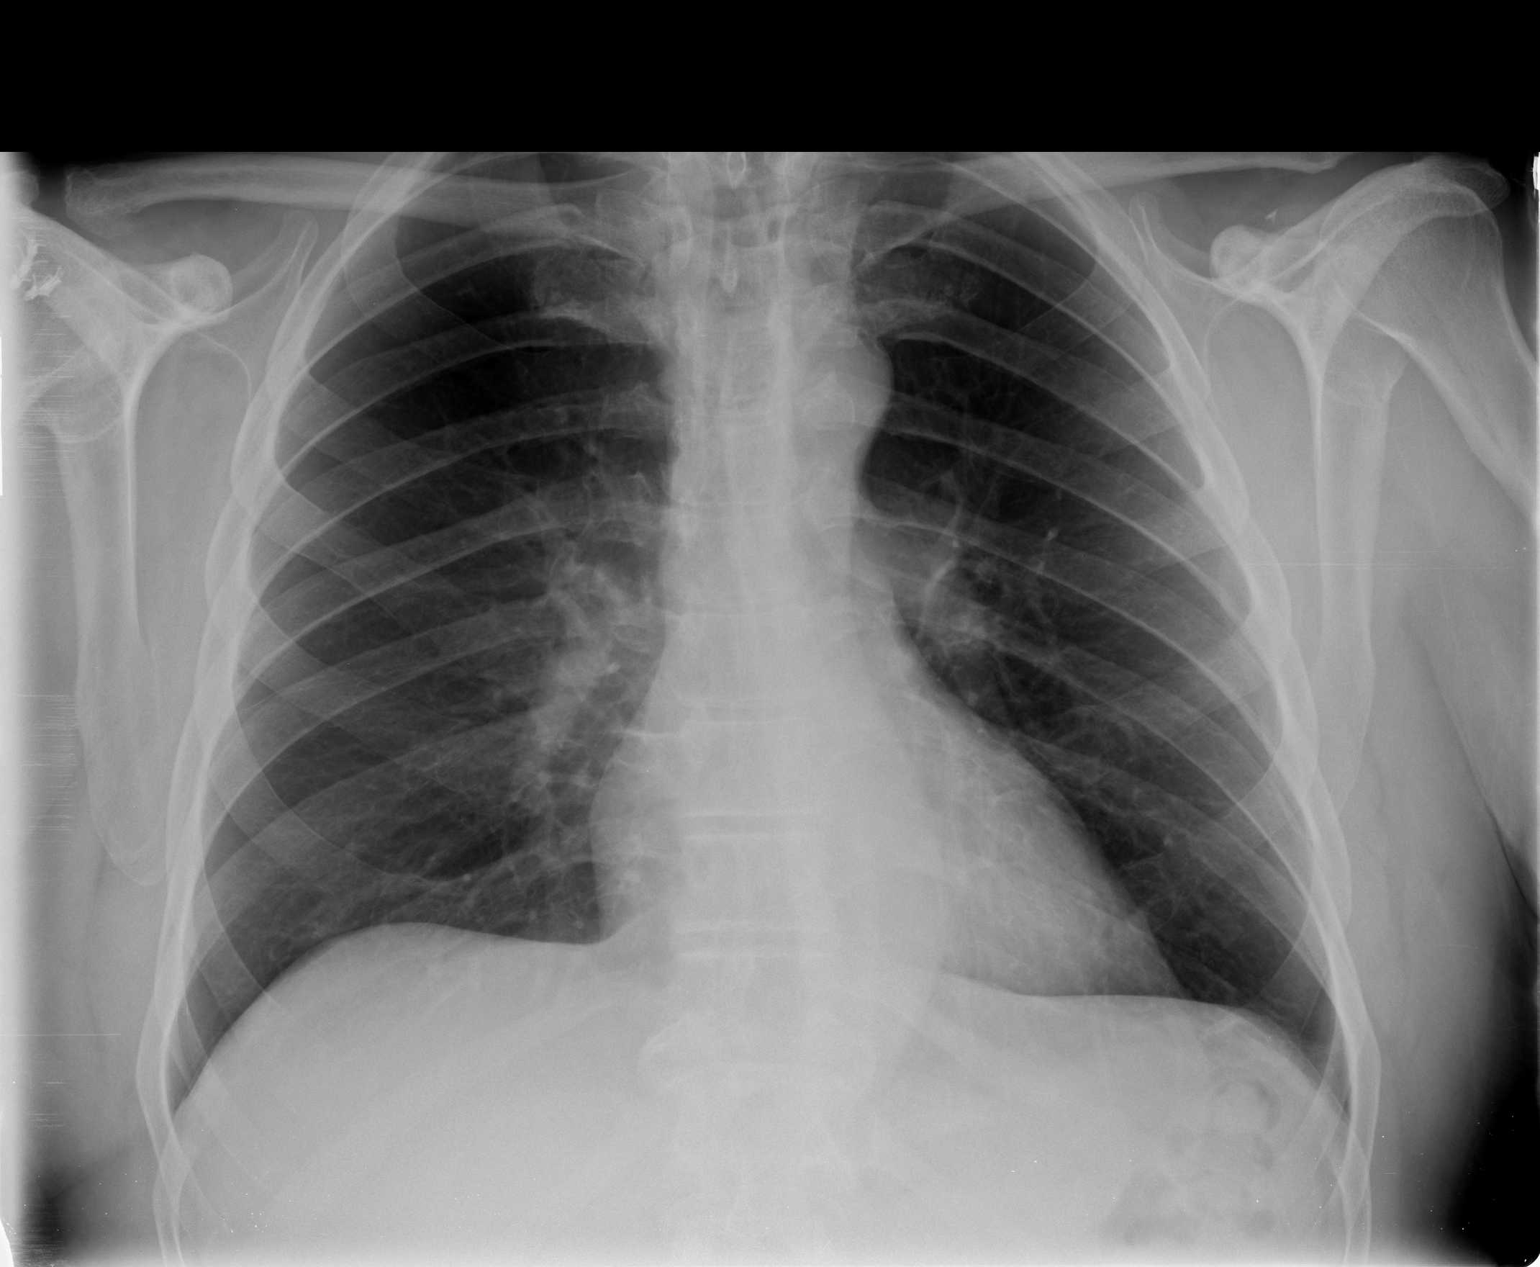

[view not recorded (2 of 2)]
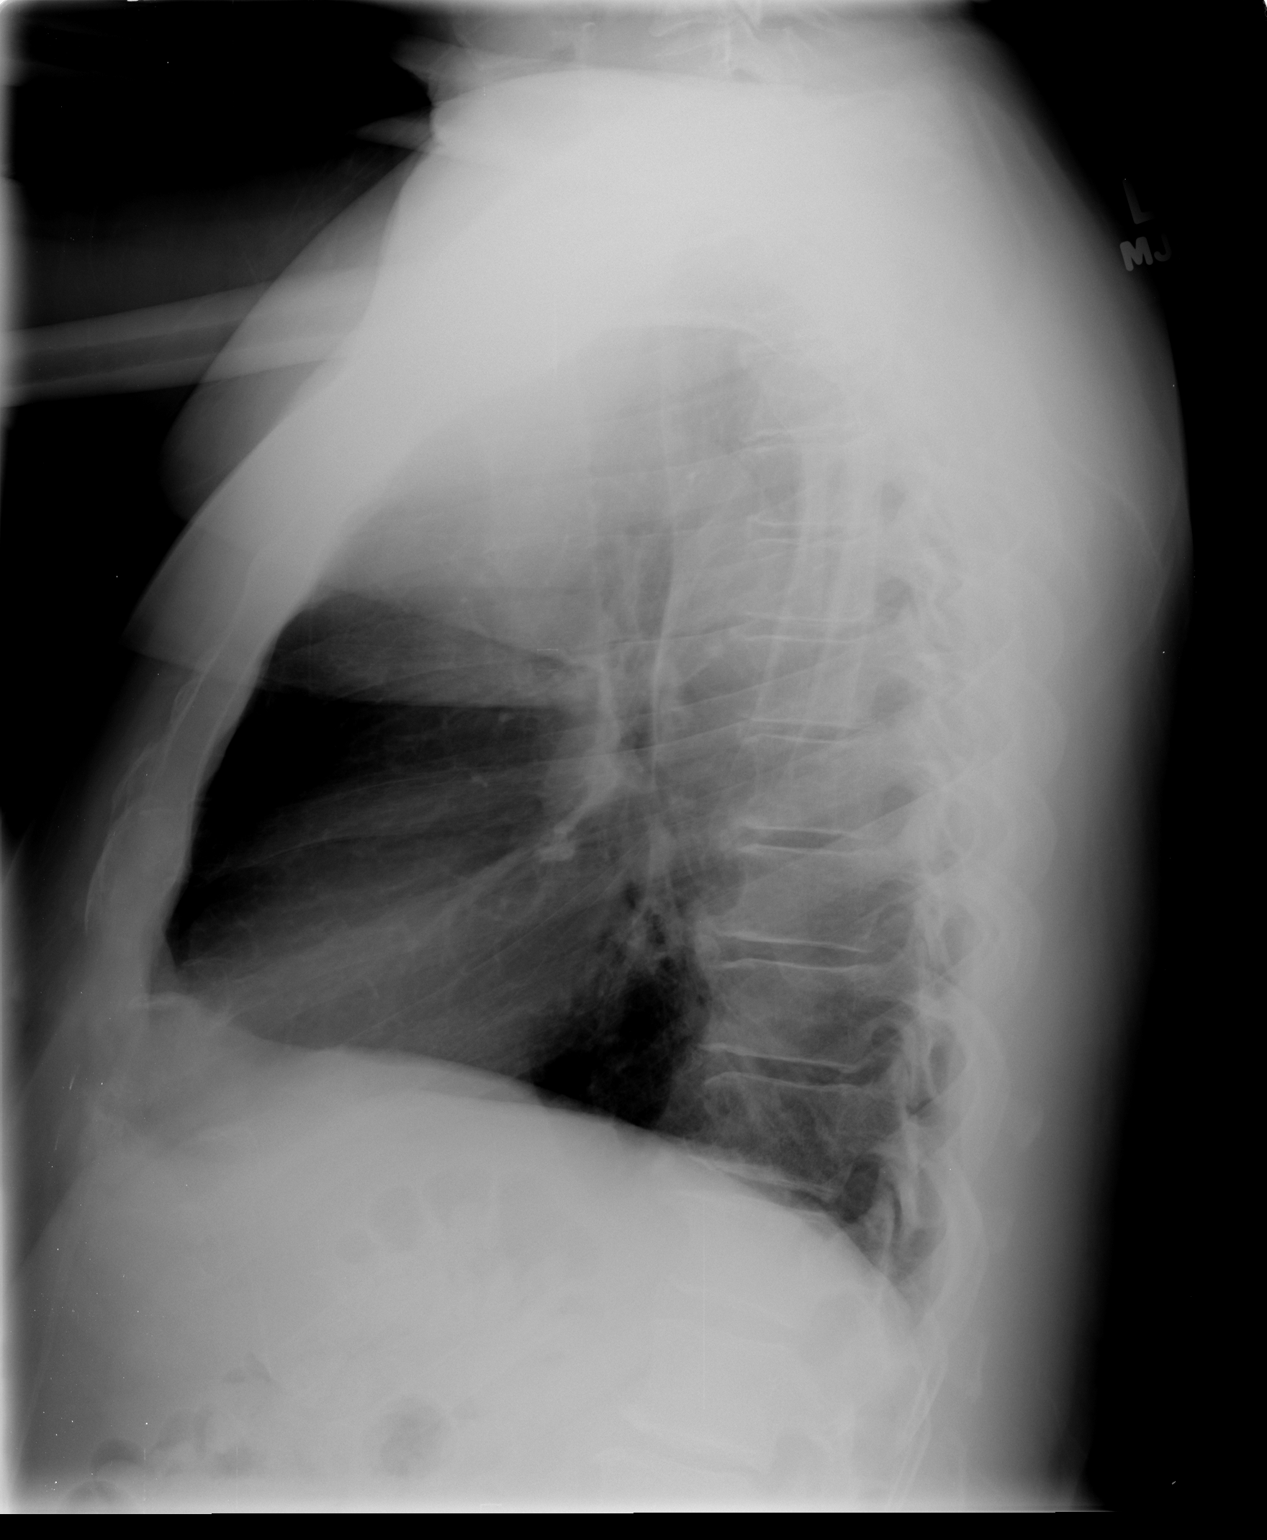

[2 of 2 positions shown; findings below may reference images not displayed]

FINDINGS: Cardiopericardial silhouette within normal limits.
Mediastinal contours normal. Trachea midline.  No airspace disease
or effusion.  Stable appearance of the prominent right hilum.
Right rotator cuff soft tissue anchors are present.  Absence of the
distal left clavicle compatible with prior subacromial
decompression.  Calcific density is present lateral to the coracoid
process which may be associated with rotator cuff tear and
calcification or soft tissue anchor and retracted tendon.

On the lateral view, there is hyperinflation with flattening of the
hemidiaphragms and enlargement of the retrosternal clear space
consistent with emphysema.
IMPRESSION: No active cardiopulmonary disease.  Stable appearance of the chest.
Hyperinflation compatible with emphysema.

## 2013-09-22 ENCOUNTER — Encounter: Payer: Self-pay | Admitting: Gastroenterology

## 2014-02-17 DIAGNOSIS — Z9889 Other specified postprocedural states: Secondary | ICD-10-CM | POA: Insufficient documentation

## 2014-06-07 LAB — TSH: TSH: 2.12 u[IU]/mL (ref <=5.90)

## 2014-06-21 ENCOUNTER — Encounter: Payer: Self-pay | Admitting: Gastroenterology

## 2014-09-06 DIAGNOSIS — K635 Polyp of colon: Secondary | ICD-10-CM

## 2014-09-06 HISTORY — PX: COLONOSCOPY: SHX174

## 2014-09-06 HISTORY — DX: Polyp of colon: K63.5

## 2014-09-09 LAB — HM COLONOSCOPY

## 2014-09-19 ENCOUNTER — Ambulatory Visit: Payer: Self-pay | Admitting: Gastroenterology

## 2014-11-14 ENCOUNTER — Ambulatory Visit: Payer: Self-pay | Admitting: Orthopedic Surgery

## 2014-12-14 ENCOUNTER — Encounter: Payer: Self-pay | Admitting: *Deleted

## 2014-12-14 LAB — CBC AND DIFFERENTIAL
HCT: 44 % (ref 41–53)
Hemoglobin: 14.5 g/dL (ref 13.5–17.5)
Neutrophils Absolute: 5 /uL
Platelets: 222 10*3/uL (ref 150–399)
WBC: 7.4 10^3/mL

## 2014-12-20 ENCOUNTER — Telehealth: Payer: Self-pay

## 2014-12-20 ENCOUNTER — Encounter: Payer: Self-pay | Admitting: General Surgery

## 2014-12-20 ENCOUNTER — Ambulatory Visit (INDEPENDENT_AMBULATORY_CARE_PROVIDER_SITE_OTHER): Payer: 59 | Admitting: General Surgery

## 2014-12-20 VITALS — BP 102/74 | HR 70 | Resp 12 | Ht 69.0 in | Wt 206.0 lb

## 2014-12-20 DIAGNOSIS — K648 Other hemorrhoids: Secondary | ICD-10-CM | POA: Diagnosis not present

## 2014-12-20 NOTE — Telephone Encounter (Signed)
Patient called and wanted to reschedule his surgery date. He is now scheduled for surgery at [facility] on 01/02/15. He will now pre admit by phone on 12/23/14. Patient is aware of new dates and instructions.

## 2014-12-20 NOTE — Addendum Note (Signed)
Addended by: Christene Lye on: 12/20/2014 01:04 PM   Modules accepted: Orders

## 2014-12-20 NOTE — Patient Instructions (Addendum)
Hemorrhoidectomy Hemorrhoidectomy is surgery to remove hemorrhoids. Hemorrhoids are veins that have become swollen in the rectum. The rectum is the area from the bottom end of the intestines to the opening where bowel movements leave the body. Hemorrhoids can be uncomfortable. They can cause itching, bleeding and pain if a blood clot forms in them (thrombose). If hemorrhoids are small, surgery may not be needed. But if they cover a larger area, surgery is usually suggested.  LET YOUR CAREGIVER KNOW ABOUT:   Any allergies.  All medications you are taking, including:  Herbs, eyedrops, over-the-counter medications and creams.  Blood thinners (anticoagulants), aspirin or other drugs that could affect blood clotting.  Use of steroids (by mouth or as creams).  Previous problems with anesthetics, including local anesthetics.  Possibility of pregnancy, if this applies.  Any history of blood clots.  Any history of bleeding or other blood problems.  Previous surgery.  Smoking history.  Other health problems. RISKS AND COMPLICATIONS All surgery carries some risk. However, hemorrhoid surgery usually goes smoothly. Possible complications could include:  Urinary retention.  Bleeding.  Infection.  A painful incision.  A reaction to the anesthesia (this is not common). BEFORE THE PROCEDURE   Stop using aspirin and non-steroidal anti-inflammatory drugs (NSAIDs) for pain relief. This includes prescription drugs and over-the-counter drugs such as ibuprofen and naproxen. Also stop taking vitamin E. If possible, do this two weeks before your surgery.  If you take blood-thinners, ask your healthcare provider when you should stop taking them.  You will probably have blood and urine tests done several days before your surgery.  Do not eat or drink for about 8 hours before the surgery.  Arrive at least an hour before the surgery, or whenever your surgeon recommends. This will give you time to  check in and fill out any needed paperwork.  Hemorrhoidectomy is often an outpatient procedure. This means you will be able to go home the same day. Sometimes, though, people stay overnight in the hospital after the procedure. Ask your surgeon what to expect. Either way, make arrangements in advance for someone to drive you home. PROCEDURE   The preparation:  You will change into a hospital gown.  You will be given an IV. A needle will be inserted in your arm. Medication can flow directly into your body through this needle.  You might be given an enema to clear your rectum.  Once in the operating room, you will probably lie on your side or be repositioned later to lying on your stomach.  You will be given anesthesia (medication) so you will not feel anything during the surgery. The surgery often is done with local anesthesia (the area near the hemorrhoids will be numb and you will be drowsy but awake). Sometimes, general anesthesia is used (you will be asleep during the procedure).  The procedure:  There are a few different procedures for hemorrhoids. Be sure to ask you surgeon about the procedure, the risks and benefits.  Be sure to ask about what you need to do to take care of the wound, if there is one. AFTER THE PROCEDURE  You will stay in a recovery area until the anesthesia has worn off. Your blood pressure and pulse will be checked every so often.  You may feel a lot of pain in the area of the rectum.  Take all pain medication prescribed by your surgeon. Ask before taking any over-the-counter pain medicines.  Sometimes sitting in a warm bath can help relieve  your pain.  To make sure you have bowel movements without straining:  You will probably need to take stool softeners (usually a pill) for a few days.  You should drink 8 to 10 glasses of water each day.  Your activity will be restricted for awhile. Ask your caregiver for a list of what you should and should not do  while you recover. Document Released: 07/21/2009 Document Revised: 12/16/2011 Document Reviewed: 07/21/2009 [facility] Patient Information 2015 [place], [place]. This information is not intended to replace advice given to you by your health care provider. Make sure you discuss any questions you have with your health care provider.  Patient is scheduled for surgery at [facility] on 01/04/15. He will pre admit by phone on 12/27/14. He will bring his C-Pap machine with him to surgery. Patient is aware of dates and instructions.

## 2014-12-20 NOTE — Progress Notes (Signed)
Patient ID: Jeremiah [name], male   DOB: [birth year], 54 y.o.   MRN: [id number]  Chief Complaint  Patient presents with  . Rectal Problems    hemorrhoids    HPI Jeremiah [name] is a 54 y.o. male.  Here today to evaluate bleeding hemorrhoids. He states he has noticed blood on his tissue and in toilet after a hard BM. His bowels move about every other day. He has noticed this for about 2 years on and off. Denies family history of colon cancer. Denies pain.   HPI  Past Medical History  Diagnosis Date  . Hypertension   . Sleep apnea     dx 1.5 yr ago...study @ [place]  . Arthritis     bil knees  . Hypothyroidism   . Anxiety   . Depression     dx 2004  . H/O hiatal hernia     had surgery for that 2012  [facility]  . Diabetes mellitus without complication     type ll  controlled by weight & diet  . Colon polyp Dec 2015    Dr Allen Norris  . Sleep apnea     uses c pap    Past Surgical History  Procedure Laterality Date  . Rotator cuff repair      x 3  on right shoulder  . Shoulder surgery      x 3 on left  . Mandible surgery      1983  . Foot surgery      right foot 2012  . Appendectomy    . Shoulder arthroscopy with rotator cuff repair and subacromial decompression Left 03/04/2013    Procedure: LEFT SHOULDER ARTHROSCOPY WITH ROTATOR CUFF REPAIR AND SUBACROMIAL DECOMPRESSION;  Surgeon: Marin Shutter, MD;  Location: [place];  Service: Orthopedics;  Laterality: Left;  . Colonoscopy  Dec 2015    Dr Allen Norris  . Hernia repair      Left inguinal  . Hernia repair      hiatal hernia at [facility]    History reviewed. No pertinent family history.  Social History History  Substance Use Topics  . Smoking status: Never Smoker   . Smokeless tobacco: Former Systems developer    Types: [place] date: 10/07/1989  . Alcohol Use: No    Allergies  Allergen Reactions  . Tequin [Gatifloxacin] Anaphylaxis  . Amoxicillin Hives    Current Outpatient Prescriptions  Medication Sig Dispense Refill  .  amLODipine (NORVASC) 10 MG tablet Take 5 mg by mouth daily.    [name] buPROPion (WELLBUTRIN XL) 300 MG 24 hr tablet Take 300 mg by mouth daily.    [name] HYDROcodone-acetaminophen (NORCO/VICODIN) 5-325 MG per tablet Take 1 tablet by mouth every 6 (six) hours as needed for moderate pain.    [name] levothyroxine (SYNTHROID, LEVOTHROID) 50 MCG tablet Take 50 mcg by mouth daily before breakfast.    . lithium carbonate 300 MG capsule Take 600 mg by mouth 2 (two) times daily with a meal.    . loratadine (CLARITIN) 10 MG tablet Take 10 mg by mouth daily.    . meloxicam (MOBIC) 15 MG tablet Take 15 mg by mouth daily.    . sildenafil (VIAGRA) 100 MG tablet Take 100 mg by mouth as needed for erectile dysfunction.    . Testosterone (TESTODERM TD) Place onto the skin.    [name] zolpidem (AMBIEN) 10 MG tablet Take 10 mg by mouth at bedtime as needed for sleep.    [name]  azelastine (ASTELIN) 137 MCG/SPRAY nasal spray Place 1 spray into the nose 2 (two) times daily. Use in each nostril as directed    . Multiple Vitamin (MULTIVITAMIN WITH MINERALS) TABS Take 1 tablet by mouth daily.     No current facility-administered medications for this visit.    Review of Systems Review of Systems  Constitutional: Negative.   Respiratory: Negative.   Cardiovascular: Negative.   Gastrointestinal: Positive for blood in stool. Negative for nausea, vomiting, diarrhea and constipation.    Blood pressure 102/74, pulse 70, resp. rate 12, height 5\' 9"  (1.753 m), weight 206 lb (93.441 kg).  Physical Exam Physical Exam  Constitutional: He is oriented to person, place, and time. He appears well-developed and well-nourished.  Neck: Neck supple.  Cardiovascular: Normal rate, regular rhythm and normal heart sounds.   Pulmonary/Chest: Effort normal and breath sounds normal.  Abdominal: Soft. There is no tenderness.  Genitourinary: Rectal exam shows internal hemorrhoid.  Lymphadenopathy:    He has no cervical adenopathy.  Neurological: He is alert  and oriented to person, place, and time.  Skin: Skin is warm and dry.  Pt has no visible external anal findings but he is tender in anterior aspect.  Anoscopy was done . There appears to be 2 internal hemorrhoids higher up , not easily accessible without the scope. No bleeding is noted. There may be other hemorrhoids .   Data Reviewed  Office notes from [name] Assessment    Internal hemorrhoids, multiple, possible fissure. Plan     EUA and hemorrhoidectomy. Discussed this in full with pt and he is agreeable. Patient is scheduled for surgery at [facility] on 01/04/15. He will pre admit by phone on 12/27/14. He will bring his C-Pap machine with him to surgery. Patient is aware of dates and instructions.        Elvia Aydin G 12/20/2014, 12:33 PM

## 2015-01-02 ENCOUNTER — Ambulatory Visit: Payer: Self-pay | Admitting: General Surgery

## 2015-01-02 DIAGNOSIS — K649 Unspecified hemorrhoids: Secondary | ICD-10-CM | POA: Diagnosis not present

## 2015-01-02 HISTORY — PX: EXCISIONAL HEMORRHOIDECTOMY: SHX1541

## 2015-01-04 ENCOUNTER — Encounter: Payer: Self-pay | Admitting: General Surgery

## 2015-01-05 ENCOUNTER — Ambulatory Visit (INDEPENDENT_AMBULATORY_CARE_PROVIDER_SITE_OTHER): Payer: 59 | Admitting: General Surgery

## 2015-01-05 ENCOUNTER — Encounter: Payer: Self-pay | Admitting: General Surgery

## 2015-01-05 ENCOUNTER — Telehealth: Payer: Self-pay | Admitting: *Deleted

## 2015-01-05 VITALS — BP 134/82 | HR 78 | Resp 14 | Ht 70.0 in | Wt 207.0 lb

## 2015-01-05 DIAGNOSIS — K648 Other hemorrhoids: Secondary | ICD-10-CM

## 2015-01-05 MED ORDER — HYDROCORTISONE ACE-PRAMOXINE 2.5-1 % RE CREA: 1.0000 "application " | TOPICAL_CREAM | Freq: Three times a day (TID) | RECTAL | Status: DC

## 2015-01-05 NOTE — Progress Notes (Signed)
This is a 54 year old male here today for his post op hemorrhoidectomy done on 01/02/15.He was having some bleeding with and without a BM. He says he has done the warm soaks but it still "stings and burns".  Bleeding occurred mostly today. It has stopped now. Exam shows no bleeding. Mild tenderness in area. Pt reassured. Rx sent for analpram cream 2.5% to use bid. F/U next week as scheduled.

## 2015-01-05 NOTE — Patient Instructions (Signed)
As scheduled

## 2015-01-05 NOTE — Telephone Encounter (Signed)
He called to say he was having some bleeding with and without a BM. He says he has done the warm soaks but it still "stings and burns". Appt per SGS.

## 2015-01-10 ENCOUNTER — Encounter: Payer: Self-pay | Admitting: *Deleted

## 2015-01-11 ENCOUNTER — Ambulatory Visit (INDEPENDENT_AMBULATORY_CARE_PROVIDER_SITE_OTHER): Payer: 59 | Admitting: General Surgery

## 2015-01-11 ENCOUNTER — Encounter: Payer: Self-pay | Admitting: General Surgery

## 2015-01-11 VITALS — BP 116/76 | HR 70 | Resp 12 | Ht 70.0 in | Wt 203.0 lb

## 2015-01-11 DIAGNOSIS — K648 Other hemorrhoids: Secondary | ICD-10-CM

## 2015-01-11 NOTE — Patient Instructions (Signed)
Patient to return in one month. 

## 2015-01-11 NOTE — Progress Notes (Signed)
This is a 54 year old male here for his post op hemorrhoidectomy done 01/02/15. He states he is still having discharge and discomfort with bowel movements.  However he is doing a whole lot better than last week. Bleeding has stopped.  Rectal area  looks clean No swelling or redness. Pt advised. The discharge should resolve in time.  Patient to return in one month.

## 2015-01-17 ENCOUNTER — Ambulatory Visit (INDEPENDENT_AMBULATORY_CARE_PROVIDER_SITE_OTHER): Payer: 59 | Admitting: General Surgery

## 2015-01-17 ENCOUNTER — Encounter: Payer: Self-pay | Admitting: General Surgery

## 2015-01-17 DIAGNOSIS — K5641 Fecal impaction: Secondary | ICD-10-CM

## 2015-01-17 NOTE — Progress Notes (Signed)
This is a 54 year old male here for his post op hemorrhoidectomy done 01/02/15. Patient states he is still having a lot of bleed with his bowel movements. Rectal exam shows no palpable findings in the anal canal. However, there is a large, hard stool higher up in the rectum. No blood seen on examining finger.  Recommended trying dulcolax suppository. If unsuccessful try fleet enema. Suggested starting miralax to help help regulate to avoid future episodes.

## 2015-01-30 LAB — SURGICAL PATHOLOGY

## 2015-02-05 NOTE — Op Note (Signed)
PATIENT NAME:  Jeremiah [name], Jeremiah [name] MR#:  [id number] DATE OF BIRTH:  1961/04/13  DATE OF PROCEDURE:  01/02/2015  PREOPERATIVE DIAGNOSIS: Rectal bleeding, internal hemorrhoids suspected.   POSTOPERATIVE DIAGNOSIS: Rectal bleeding, internal hemorrhoids suspected.   OPERATION PERFORMED: Anal exam under anesthesia with internal hemorrhoidectomy of 3 columns.   SURGEON: S.G. Jamal Collin, MD   ANESTHESIA: General.   COMPLICATIONS: None.   ESTIMATED BLOOD LOSS: Minimal.   DRAINS: None.   DESCRIPTION OF PROCEDURE: The patient was put to sleep with an LMA and then placed in the lithotomy position. The anal area was prepped and draped out as a sterile field. Timeout was performed. Inspection of the anal area showed no external findings. Careful examination showed no evidence of a fissure. Digital and speculum examination was then performed showing that the patient had notable internal hemorrhoids at about the 3 o'clock to 4 o'clock, the 7 o'clock, and 11 o'clock locations. Sequentially these were grasped and removed with the use of the LigaSure device with complete sealing of the base and no bleeding. Evaluation of the lower rectum and the anal canal otherwise was free of any abnormal findings. The area was coated with some bacitracin ointment and dressed. The patient tolerated the procedure well. Also, patient had about 15 to 20 mL of 0.5% Marcaine instilled in the anal region for postoperative analgesia. The patient was subsequently returned to the recovery room in stable condition.    ____________________________ S.Robinette Haines, MD sgs:ST D: 01/02/2015 13:24:54 ET T: 01/03/2015 02:19:16 ET JOB#: [id number]  cc: Synthia Innocent. Jamal Collin, MD, <Dictator> [facility] Robinette Haines MD ELECTRONICALLY SIGNED 01/04/2015 10:12

## 2015-02-08 ENCOUNTER — Ambulatory Visit: Payer: 59 | Admitting: General Surgery

## 2015-02-17 ENCOUNTER — Other Ambulatory Visit: Payer: Self-pay | Admitting: Otolaryngology

## 2015-02-17 DIAGNOSIS — R42 Dizziness and giddiness: Secondary | ICD-10-CM

## 2015-02-27 ENCOUNTER — Ambulatory Visit: Payer: Self-pay

## 2015-03-02 ENCOUNTER — Ambulatory Visit
Admission: RE | Admit: 2015-03-02 | Discharge: 2015-03-02 | Disposition: A | Discharge status: Home / Self Care | Payer: 59 | Source: Ambulatory Visit | Attending: Otolaryngology | Admitting: Otolaryngology

## 2015-03-02 DIAGNOSIS — R42 Dizziness and giddiness: Secondary | ICD-10-CM

## 2015-03-02 DIAGNOSIS — I739 Peripheral vascular disease, unspecified: Secondary | ICD-10-CM | POA: Diagnosis not present

## 2015-03-02 DIAGNOSIS — H9313 Tinnitus, bilateral: Secondary | ICD-10-CM | POA: Insufficient documentation

## 2015-03-02 DIAGNOSIS — H905 Unspecified sensorineural hearing loss: Secondary | ICD-10-CM | POA: Insufficient documentation

## 2015-03-02 MED ORDER — GADOBENATE DIMEGLUMINE 529 MG/ML IV SOLN
20.0000 mL | Freq: Once | INTRAVENOUS | Status: AC | PRN
  Administered 2015-03-02: 19 mL via INTRAVENOUS

## 2015-03-08 ENCOUNTER — Encounter: Payer: Self-pay | Admitting: Family Medicine

## 2015-03-08 ENCOUNTER — Ambulatory Visit (INDEPENDENT_AMBULATORY_CARE_PROVIDER_SITE_OTHER): Payer: 59 | Admitting: Family Medicine

## 2015-03-08 VITALS — BP 112/68 | HR 60 | Temp 98.0°F | Resp 16 | Ht 70.0 in | Wt 203.6 lb

## 2015-03-08 DIAGNOSIS — R600 Localized edema: Secondary | ICD-10-CM | POA: Insufficient documentation

## 2015-03-08 NOTE — Progress Notes (Signed)
Subjective:     Patient ID: Jeremiah [name], male   DOB: 1961-01-13, 54 y.o.   MRN: [id number]  HPIStates he has had intermittent painless ankle swelling over the last 3 weeks. Reports a trip to [facility] recently and noticed swelling at that time. Reports swelling goes down at night. Also wishes lithium level checked today prior to psychiatrist visit tomorrow. Primarily concerned about possibility of blood clots due to hx of knee replacements in the past year.   Review of Systems  Respiratory: Negative for shortness of breath.        Objective:   Physical Exam  Constitutional: He appears well-developed and well-nourished.  Cardiovascular: Normal rate, regular rhythm and intact distal pulses.   1+ pitting edema of the ankles bilaterally  Pulmonary/Chest: Breath sounds normal.       Assessment:     Pedal edema - Plan: Renal Function Panel, Lithium level Most likely due to amlodipine.    Plan:     Consider short term use of diuretic if renal status stable.

## 2015-03-08 NOTE — Patient Instructions (Signed)
Discussed likely to amlodipine but will check your kidney status.

## 2015-03-09 ENCOUNTER — Telehealth: Payer: Self-pay | Admitting: Family Medicine

## 2015-03-09 ENCOUNTER — Telehealth: Payer: Self-pay

## 2015-03-09 LAB — RENAL FUNCTION PANEL
Albumin: 4.9 g/dL (ref 3.5–5.5)
BUN/Creatinine Ratio: 12 (ref 9–20)
BUN: 14 mg/dL (ref 6–24)
CO2: 21 mmol/L (ref 18–29)
Calcium: 9.9 mg/dL (ref 8.7–10.2)
Chloride: 102 mmol/L (ref 97–108)
Creatinine, Ser: 1.13 mg/dL (ref 0.76–1.27)
GFR calc Af Amer: 84 mL/min/{1.73_m2} (ref >59)
GFR calc non Af Amer: 72 mL/min/{1.73_m2} (ref >59)
Glucose: 111 mg/dL — ABNORMAL HIGH (ref 65–99)
Phosphorus: 4.1 mg/dL (ref 2.5–4.5)
Potassium: 4.6 mmol/L (ref 3.5–5.2)
Sodium: 138 mmol/L (ref 134–144)

## 2015-03-09 LAB — LITHIUM LEVEL: Lithium Lvl: 1 mmol/L (ref 0.6–1.4)

## 2015-03-09 NOTE — Telephone Encounter (Signed)
Left voicemail to call back in reference to recent lab report. KW

## 2015-03-09 NOTE — Telephone Encounter (Signed)
At patient request we are faxing lab results to Dr Bridgett Larsson. Fax# [phone]

## 2015-03-09 NOTE — Telephone Encounter (Signed)
Patient advised of lab results he has no further questions or concerns at this time. KW

## 2015-05-10 ENCOUNTER — Encounter: Payer: Self-pay | Admitting: Family Medicine

## 2015-05-10 ENCOUNTER — Ambulatory Visit (INDEPENDENT_AMBULATORY_CARE_PROVIDER_SITE_OTHER): Payer: 59 | Admitting: Family Medicine

## 2015-05-10 VITALS — BP 132/80 | HR 76 | Temp 97.6°F | Resp 16 | Wt 207.0 lb

## 2015-05-10 DIAGNOSIS — M7051 Other bursitis of knee, right knee: Secondary | ICD-10-CM

## 2015-05-10 DIAGNOSIS — H16132 Photokeratitis, left eye: Secondary | ICD-10-CM | POA: Diagnosis not present

## 2015-05-10 DIAGNOSIS — L989 Disorder of the skin and subcutaneous tissue, unspecified: Secondary | ICD-10-CM

## 2015-05-10 NOTE — Progress Notes (Signed)
Patient ID: Jeremiah [name], male   DOB: Jan 11, 1961, 54 y.o.   MRN: [id number]   Jeremiah [name]  MRN: [id number] DOB: 1960/10/22  Subjective:  HPI  1. Lesion of skin of wrist Patient is a 54 year old that presents today for evaluation of a lesion on his left wrist.  He first noticed it 1 week ago.  He said it felt like it was just a little bump and then yesterday he noticed it had gotten bigger.  It is not red, itching or draining.     Patient Active Problem List   Diagnosis Date Noted  . Pedal edema 03/08/2015  . Diabetes 08/31/2013  . BP (high blood pressure) 08/31/2013  . Adult hypothyroidism 08/31/2013  . Obstructive apnea 08/31/2013  . Idiopathic localized osteoarthropathy 05/31/2013  . ABDOMINAL PAIN, LEFT UPPER QUADRANT 10/04/2009  . TARSAL TUNNEL SYNDROME, RIGHT 09/25/2009  . ANKLE PAIN, RIGHT 09/25/2009  . OTHER ANKLE SPRAIN AND STRAIN 09/25/2009  . BREAST MASS, RIGHT 01/07/2008  . INSOMNIA 01/07/2008  . GASTRIC ULCER, ACUTE 11/09/2007  . DUODENITIS 11/09/2007  . BLOOD IN STOOL, OCCULT 11/09/2007  . PERSONAL HISTORY OF COLONIC POLYPS 11/09/2007  . BACK PAIN, LUMBAR 10/12/2007  . HELICOBACTER PYLORI GASTRITIS 09/09/2007  . GASTRIC ULCER, ACUTE, HEMORRHAGE 09/09/2007  . HIATAL HERNIA 09/09/2007  . COLONIC POLYPS, ADENOMATOUS 08/19/2007  . INTERNAL HEMORRHOIDS 08/19/2007  . EXTERNAL HEMORRHOIDS WITHOUT MENTION COMP 08/07/2007  . Blood in stool 08/07/2007  . ABDOMINAL PAIN, LEFT LOWER QUADRANT 08/07/2007  . SINUSITIS, ACUTE 06/12/2007  . ABDOMINAL PAIN 06/12/2007  . DIABETES MELLITUS, TYPE II 03/19/2007  . GOUT 03/19/2007  . DISORDER, CIRCADIAN RHYTHM SLEEP, NONORGANIC 03/19/2007  . DEPRESSION 03/19/2007  . OSTEOARTHROSIS, GENERALIZED, MULTIPLE SITES 03/19/2007    Past Medical History  Diagnosis Date  . Hypertension   . Sleep apnea     dx 1.5 yr ago...study @ [place]  . Arthritis     bil knees  . Hypothyroidism   . Anxiety   . Depression     dx 2004  .  H/O hiatal hernia     had surgery for that 2012  [facility]  . Diabetes mellitus without complication     type ll  controlled by weight & diet  . Colon polyp Dec 2015    Dr Allen Norris  . Sleep apnea     uses c pap    History   Social History  . Marital Status: Married    Spouse Name: N/A  . Number of Children: N/A  . Years of Education: N/A   Occupational History  . Not on file.   Social History Main Topics  . Smoking status: Never Smoker   . Smokeless tobacco: Former Systems developer    Types: [place] date: 10/07/1989  . Alcohol Use: No  . Drug Use: No  . Sexual Activity: Not on file   Other Topics Concern  . Not on file   Social History Narrative    Outpatient Prescriptions Prior to Visit  Medication Sig Dispense Refill  . amLODipine (NORVASC) 10 MG tablet Take 5 mg by mouth daily.    [name] azelastine (ASTELIN) 137 MCG/SPRAY nasal spray Place 1 spray into the nose 2 (two) times daily. Use in each nostril as directed    . buPROPion (WELLBUTRIN XL) 300 MG 24 hr tablet Take 300 mg by mouth daily.    [name] HYDROcodone-acetaminophen (NORCO/VICODIN) 5-325 MG per tablet Take 1 tablet by mouth every 6 (six)  hours as needed for moderate pain.    . hydrocortisone (ANUSOL-HC) 25 MG suppository Place rectally.    . hydrocortisone-pramoxine (ANALPRAM HC) 2.5-1 % rectal cream Place 1 application rectally 3 (three) times daily. 30 g 0  . levothyroxine (SYNTHROID, LEVOTHROID) 50 MCG tablet Take 50 mcg by mouth daily before breakfast.    . lithium carbonate 300 MG capsule Take 600 mg by mouth 2 (two) times daily with a meal.    . loratadine (CLARITIN) 10 MG tablet Take 10 mg by mouth daily.    . meloxicam (MOBIC) 15 MG tablet Take 15 mg by mouth daily.    . Multiple Vitamin (MULTIVITAMIN WITH MINERALS) TABS Take 1 tablet by mouth daily.    . sildenafil (VIAGRA) 100 MG tablet Take 100 mg by mouth as needed for erectile dysfunction.    . temazepam (RESTORIL) 30 MG capsule Take by mouth.    . traMADol  (ULTRAM) 50 MG tablet Take 50 mg by mouth every 6 (six) hours as needed.    . zolpidem (AMBIEN) 10 MG tablet Take 10 mg by mouth at bedtime as needed for sleep.    . tadalafil (CIALIS) 5 MG tablet Take by mouth.    . Testosterone (TESTODERM TD) Place onto the skin.    . Testosterone 2 MG/24HR PT24 Place 4 mg onto the skin daily.     No facility-administered medications prior to visit.    Allergies  Allergen Reactions  . Tequin [Gatifloxacin] Anaphylaxis  . Amoxicillin Hives    Review of Systems  Constitutional: Negative.   Respiratory: Negative.   Cardiovascular: Negative.   Skin:       Lesion on right wrist  Neurological: Negative for dizziness and headaches.   Objective:  BP 132/80 mmHg  Pulse 76  Temp(Src) 97.6 F (36.4 C) (Oral)  Resp 16  Wt 207 lb (93.895 kg)  Physical Exam  Constitutional: He is well-developed, well-nourished, and in no distress.  Cardiovascular: Normal rate, regular rhythm and normal heart sounds.   Pulmonary/Chest: Effort normal and breath sounds normal.  Musculoskeletal: Normal range of motion.  Skin:  Probable actinic keratosis on right wrist  some fluid present in the lateral aspect of the right knee bursa.  Assessment and Plan :   1. Lesion of skin of wrist   2. Actinic keratitis, left Will follow.  Have offered to cryotherapy, patient declines at this time.    3. Bursitis of knee, right Patient is s/p knee replacement.    4. Bipolar disorder Controlled.  Miguel Aschoff MD [facility] 05/10/2015 10:33 AM

## 2015-05-23 ENCOUNTER — Ambulatory Visit (INDEPENDENT_AMBULATORY_CARE_PROVIDER_SITE_OTHER): Payer: 59 | Admitting: Family Medicine

## 2015-05-23 ENCOUNTER — Encounter: Payer: Self-pay | Admitting: Family Medicine

## 2015-05-23 VITALS — BP 110/78 | HR 64 | Temp 97.8°F | Resp 16 | Wt 209.6 lb

## 2015-05-23 DIAGNOSIS — S29012A Strain of muscle and tendon of back wall of thorax, initial encounter: Secondary | ICD-10-CM

## 2015-05-23 MED ORDER — CYCLOBENZAPRINE HCL 5 MG PO TABS: 5.0000 mg | ORAL_TABLET | Freq: Three times a day (TID) | ORAL | Status: DC | PRN

## 2015-05-23 MED ORDER — HYDROCODONE-ACETAMINOPHEN 5-325 MG PO TABS: 1.0000 | ORAL_TABLET | Freq: Four times a day (QID) | ORAL | Status: DC | PRN

## 2015-05-23 NOTE — Patient Instructions (Addendum)
Discussed use of heating pad for 20 minutes several x day and two Aleve twice daily with food.

## 2015-05-23 NOTE — Progress Notes (Signed)
Subjective:     Patient ID: Jeremiah [name], male   DOB: 1960-11-08, 54 y.o.   MRN: [id number]  HPI  Chief Complaint  Patient presents with  . Back Pain    Patient comes in office today with concerns of back pain since Saturday 8/13. Patient states that pain is located in the middle of his back and is described as a sharp pain, patient reports that he does have a history of back problems.   States it started after he had been at the pool all day. No radiation of pain or hx of back surgery.   Review of Systems  Constitutional: Negative for fever and chills.  Musculoskeletal: Positive for back pain (increases with change of position).       Objective:   Physical Exam  Constitutional: He appears well-developed and well-nourished. No distress.  Muscle strength in lower extremities 5/5. SLR's to 90 degrees without radiation of back pain. Mildly tender in the bilateral lower thoracic paravertebral area.     Assessment:    1. Strain of mid-back, initial encounter - HYDROcodone-acetaminophen (NORCO/VICODIN) 5-325 MG per tablet; Take 1 tablet by mouth every 6 (six) hours as needed for moderate pain.  Dispense: 28 tablet; Refill: 0 - cyclobenzaprine (FLEXERIL) 5 MG tablet; Take 1 tablet (5 mg total) by mouth 3 (three) times daily as needed for muscle spasms.  Dispense: 21 tablet; Refill: 0    Plan:   Discussed use of nsaid's and warm compresses

## 2015-06-22 ENCOUNTER — Ambulatory Visit (INDEPENDENT_AMBULATORY_CARE_PROVIDER_SITE_OTHER): Payer: 59 | Admitting: Family Medicine

## 2015-06-22 ENCOUNTER — Telehealth: Payer: Self-pay | Admitting: Family Medicine

## 2015-06-22 ENCOUNTER — Encounter: Payer: Self-pay | Admitting: Family Medicine

## 2015-06-22 VITALS — BP 108/74 | HR 67 | Temp 98.0°F | Resp 16 | Wt 207.4 lb

## 2015-06-22 DIAGNOSIS — S29012D Strain of muscle and tendon of back wall of thorax, subsequent encounter: Secondary | ICD-10-CM | POA: Diagnosis not present

## 2015-06-22 MED ORDER — METAXALONE 800 MG PO TABS: 800.0000 mg | ORAL_TABLET | Freq: Three times a day (TID) | ORAL | Status: DC

## 2015-06-22 NOTE — Progress Notes (Signed)
Subjective:     Patient ID: Jeremiah [name], male   DOB: 1960/10/30, 54 y.o.   MRN: [id number]  HPI  Chief Complaint  Patient presents with  . Follow-up    Patient is present in office today for follow up from 8/16, patien was last seen for strain of mid back and was prescribed Vicodin 5-325mg  and Flexeril 5mg . Patient reports that medication made him tired sleeping 12 hrs, he states that it was effecting his job. Patient states symptoms have not improved  States he thought he was getting better but back spasms have recurred. Denies re-injury. Occasional tingling sensation to his left upper leg. Has been taking ibuprofen 400 mg.twice daily.   Review of Systems  Constitutional: Negative for fever and chills.       Objective:   Physical Exam  Constitutional: He appears well-developed and well-nourished. No distress.  Musculoskeletal:  Mild tenderness/spasm to bilateral lower thoracic/upper lumbar paravertebral area.       Assessment:    1. Strain of mid-back, subsequent encounter - metaxalone (SKELAXIN) 800 MG tablet; Take 1 tablet (800 mg total) by mouth 3 (three) times daily.  Dispense: 30 tablet; Refill: 1    Plan:    Increase ibuprofen to 3 x day with meals. Consider x-ray if not continuing to improve over the next two weeks.

## 2015-06-22 NOTE — Telephone Encounter (Signed)
Patient has been advised appt is being arranged. KW

## 2015-06-22 NOTE — Telephone Encounter (Signed)
Prescription change request. Jeremiah [name]

## 2015-06-22 NOTE — Telephone Encounter (Signed)
It has been a month and his back should be better. If still requiring medication need to see again.

## 2015-06-22 NOTE — Patient Instructions (Signed)
Continue ibuprofen 400 mg. 3 x day with food. See if the new muscle relaxant makes you less sleepy.

## 2015-06-22 NOTE — Telephone Encounter (Signed)
Pt states he has been taking the Rx cyclobenzaprine (FLEXERIL) 5 MG for back pain.  Pt states he would like to get something different due to this makes him sleepy and he want to sleep all the time when taking it.  [place]

## 2015-06-29 ENCOUNTER — Ambulatory Visit
Admission: RE | Admit: 2015-06-29 | Discharge: 2015-06-29 | Disposition: A | Discharge status: Home / Self Care | Payer: 59 | Source: Ambulatory Visit | Attending: Family Medicine | Admitting: Family Medicine

## 2015-06-29 ENCOUNTER — Other Ambulatory Visit: Payer: Self-pay | Admitting: Family Medicine

## 2015-06-29 ENCOUNTER — Ambulatory Visit (INDEPENDENT_AMBULATORY_CARE_PROVIDER_SITE_OTHER): Payer: 59 | Admitting: Family Medicine

## 2015-06-29 ENCOUNTER — Encounter: Payer: Self-pay | Admitting: Family Medicine

## 2015-06-29 VITALS — BP 102/72 | HR 70 | Temp 97.6°F | Resp 16 | Wt 204.8 lb

## 2015-06-29 DIAGNOSIS — M545 Low back pain, unspecified: Secondary | ICD-10-CM

## 2015-06-29 DIAGNOSIS — M5136 Other intervertebral disc degeneration, lumbar region: Secondary | ICD-10-CM | POA: Diagnosis not present

## 2015-06-29 NOTE — Patient Instructions (Addendum)
We will call you with the x-ray results. 

## 2015-06-29 NOTE — Progress Notes (Signed)
Subjective:     Patient ID: Jeremiah [name], male   DOB: 1960-11-04, 54 y.o.   MRN: [id number]  HPI  Chief Complaint  Patient presents with  . Back Pain    Patient is here for follow up visit from 06/22/15, patient was seen for strain in his mid back and prescribed Skelaxin as well increase Ibuprofen. Patient reports since visit pain has got worse  Reports persistent spasms with position change but no radicular sx. States his back "evens out" when he lays flat. Now localizing more to his lower back. Has been taking both Flexeril and Skelaxin at different times and I have told him to stay with Skelaxin.   Review of Systems  Constitutional: Negative for fever and chills.       Objective:   Physical Exam  Constitutional: He appears well-developed and well-nourished. He appears distressed (mild discomfort when changing positions).  Musculoskeletal:  Muscle strength in lower extremities 5/5. SLR's to 90 degrees without radiation of back pain.       Assessment:    1. Right-sided low back pain without sciatica - DG Lumbar Spine Complete; Future    Plan:    Continue Skelaxin; probable orthopedic referral.

## 2015-06-30 ENCOUNTER — Telehealth: Payer: Self-pay | Admitting: Family Medicine

## 2015-06-30 NOTE — Telephone Encounter (Signed)
Pt called asking about his xray results for his lower back.  He said if he didn't answer you could leave a message.    His call back is   [phone]  Thanks teri

## 2015-07-01 NOTE — Telephone Encounter (Signed)
Please review. Thanks!  

## 2015-07-04 NOTE — Telephone Encounter (Signed)
I personally called him to talk about his x-ray and refer him to orthopedics a few days ago.

## 2015-07-04 NOTE — Telephone Encounter (Signed)
Please see below=aa pt saw you thank you-aa

## 2015-07-04 NOTE — Telephone Encounter (Signed)
I don't see where he has been seen or an x-ray ordered.

## 2015-07-06 ENCOUNTER — Encounter: Payer: 59 | Admitting: Family Medicine

## 2015-07-12 ENCOUNTER — Encounter: Payer: Self-pay | Admitting: Family Medicine

## 2015-07-12 ENCOUNTER — Ambulatory Visit (INDEPENDENT_AMBULATORY_CARE_PROVIDER_SITE_OTHER): Payer: 59 | Admitting: Family Medicine

## 2015-07-12 VITALS — BP 112/80 | HR 60 | Temp 97.7°F | Resp 16 | Ht 70.0 in | Wt 205.0 lb

## 2015-07-12 DIAGNOSIS — R7303 Prediabetes: Secondary | ICD-10-CM

## 2015-07-12 DIAGNOSIS — F3130 Bipolar disorder, current episode depressed, mild or moderate severity, unspecified: Secondary | ICD-10-CM | POA: Diagnosis not present

## 2015-07-12 DIAGNOSIS — Z0000 Encounter for general adult medical examination without abnormal findings: Secondary | ICD-10-CM

## 2015-07-12 DIAGNOSIS — F319 Bipolar disorder, unspecified: Secondary | ICD-10-CM

## 2015-07-12 LAB — POCT URINALYSIS DIPSTICK
Bilirubin, UA: NEGATIVE
Blood, UA: NEGATIVE
Glucose, UA: NEGATIVE
Ketones, UA: NEGATIVE
Leukocytes, UA: NEGATIVE
Nitrite, UA: NEGATIVE
Protein, UA: NEGATIVE
Spec Grav, UA: 1.01
Urobilinogen, UA: NEGATIVE
pH, UA: 6

## 2015-07-12 NOTE — Progress Notes (Signed)
Patient ID: Jeremiah [name], male   DOB: 12/07/1960, 54 y.o.   MRN: [id number] Patient: Jeremiah [name], Male    DOB: 10/13/1960, 54 y.o.   MRN: [id number] Visit Date: 07/12/2015  Today's Provider: [name], MD   Chief Complaint  Patient presents with  . Annual Exam   Subjective:  Jeremiah [name] is a 54 y.o. male who presents today for health maintenance and complete physical. He feels fairly well. He reports exercising none. He reports he is sleeping fairly well.   Review of Systems  Constitutional: Positive for activity change (Patient plans to resume activity at the gym now that he is recovered from knee replacement.). Negative for fever, chills, diaphoresis, appetite change, fatigue and unexpected weight change.  HENT: Positive for rhinorrhea (seasonal allergies) and tinnitus (chronic). Negative for congestion, dental problem, drooling, ear discharge, ear pain, facial swelling, hearing loss, mouth sores, nosebleeds, postnasal drip, sinus pressure, sneezing, sore throat, trouble swallowing and voice change.   Eyes: Negative for photophobia, pain, discharge, redness, itching and visual disturbance.  Respiratory: Negative for apnea, cough, choking, chest tightness, shortness of breath, wheezing and stridor.   Cardiovascular: Positive for leg swelling (Bilateral swelling.  Patient states he sits a lot at work, howver swelling is not as bad as it used to be.). Negative for chest pain and palpitations.  Gastrointestinal: Negative for nausea, vomiting, abdominal pain, diarrhea, constipation, blood in stool, abdominal distention, anal bleeding and rectal pain.  Endocrine: Negative for cold intolerance, heat intolerance, polydipsia, polyphagia and polyuria.  Genitourinary: Negative for dysuria, urgency, frequency, hematuria, flank pain, decreased urine volume, discharge, penile swelling, scrotal swelling, enuresis, difficulty urinating, genital sores, penile pain and testicular pain.   Musculoskeletal: Positive for back pain, joint swelling and neck pain. Negative for myalgias, arthralgias, gait problem and neck stiffness.       Chronic issues.  Patient had been on Meloxicam and his [place] discontinued it because patient did not think it was working.  However, since stopping the medication the patient notes that it must have been working because his joints hurt more now than they did.  Skin: Negative for color change, pallor, rash and wound.  Allergic/Immunologic: Negative for environmental allergies, food allergies and immunocompromised state.  Neurological: Negative for dizziness, tremors, seizures, syncope, facial asymmetry, speech difficulty, weakness, light-headedness, numbness and headaches.  Hematological: Negative for adenopathy. Does not bruise/bleed easily.  Psychiatric/Behavioral: Negative for suicidal ideas, hallucinations, behavioral problems, confusion, sleep disturbance, self-injury, dysphoric mood, decreased concentration and agitation. The patient is not nervous/anxious and is not hyperactive.     Social History   Social History  . Marital Status: Married    Spouse Name: N/A  . Number of Children: N/A  . Years of Education: N/A   Occupational History  . Not on file.   Social History Main Topics  . Smoking status: Never Smoker   . Smokeless tobacco: Former Systems developer    Types: [place] date: 10/07/1989  . Alcohol Use: 0.0 oz/week    0 Standard drinks or equivalent per week     Comment: 2 beers per day  . Drug Use: No  . Sexual Activity: Not on file   Other Topics Concern  . Not on file   Social History Narrative    Patient Active Problem List   Diagnosis Date Noted  . Pedal edema 03/08/2015  . Diabetes ([place]) 08/31/2013  . BP (high blood pressure) 08/31/2013  . Adult hypothyroidism 08/31/2013  . Obstructive  apnea 08/31/2013  . Idiopathic localized osteoarthropathy 05/31/2013  . ABDOMINAL PAIN, LEFT UPPER QUADRANT 10/04/2009  .  TARSAL TUNNEL SYNDROME, RIGHT 09/25/2009  . ANKLE PAIN, RIGHT 09/25/2009  . OTHER ANKLE SPRAIN AND STRAIN 09/25/2009  . BREAST MASS, RIGHT 01/07/2008  . INSOMNIA 01/07/2008  . GASTRIC ULCER, ACUTE 11/09/2007  . DUODENITIS 11/09/2007  . BLOOD IN STOOL, OCCULT 11/09/2007  . PERSONAL HISTORY OF COLONIC POLYPS 11/09/2007  . BACK PAIN, LUMBAR 10/12/2007  . HELICOBACTER PYLORI GASTRITIS 09/09/2007  . GASTRIC ULCER, ACUTE, HEMORRHAGE 09/09/2007  . HIATAL HERNIA 09/09/2007  . COLONIC POLYPS, ADENOMATOUS 08/19/2007  . INTERNAL HEMORRHOIDS 08/19/2007  . EXTERNAL HEMORRHOIDS WITHOUT MENTION COMP 08/07/2007  . Blood in stool 08/07/2007  . ABDOMINAL PAIN, LEFT LOWER QUADRANT 08/07/2007  . SINUSITIS, ACUTE 06/12/2007  . ABDOMINAL PAIN 06/12/2007  . DIABETES MELLITUS, TYPE II 03/19/2007  . GOUT 03/19/2007  . DISORDER, CIRCADIAN RHYTHM SLEEP, NONORGANIC 03/19/2007  . DEPRESSION 03/19/2007  . OSTEOARTHROSIS, GENERALIZED, MULTIPLE SITES 03/19/2007    Past Surgical History  Procedure Laterality Date  . Rotator cuff repair      x 3  on right shoulder  . Shoulder surgery      x 3 on left  . Mandible surgery      1983  . Foot surgery      right foot 2012  . Appendectomy    . Shoulder arthroscopy with rotator cuff repair and subacromial decompression Left 03/04/2013    Procedure: LEFT SHOULDER ARTHROSCOPY WITH ROTATOR CUFF REPAIR AND SUBACROMIAL DECOMPRESSION;  Surgeon: Marin Shutter, MD;  Location: [facility];  Service: Orthopedics;  Laterality: Left;  . Colonoscopy  Dec 2015    Dr Allen Norris  . Hernia repair      Left inguinal  . Hernia repair      hiatal her[facility] at [facility]  . Excisional hemorrhoidectomy  01/02/15  . Replacement total knee bilateral      patient had knee replacement to his right side in 2015 and right 2014    His family history includes Angina in his mother; Arthritis in his mother; Asthma in his sister; Bipolar disorder in his sister; CAD in his mother; Cancer in his maternal  grandmother; Colon polyps in his father; Diabetes in his father and maternal grandmother; Heart attack in his father and maternal grandfather; Heart disease in his father; Stomach cancer in his paternal grandmother.    Outpatient Prescriptions Prior to Visit  Medication Sig Dispense Refill  . amLODipine (NORVASC) 10 MG tablet Take 5 mg by mouth daily.    Renae Gloss 4 MG/24HR PT24 patch     . buPROPion (WELLBUTRIN XL) 300 MG 24 hr tablet Take 300 mg by mouth daily.    [name] HYDROcodone-acetaminophen (NORCO/VICODIN) 5-325 MG per tablet Take 1 tablet by mouth every 6 (six) hours as needed for moderate pain. 28 tablet 0  . hydrocortisone (ANUSOL-HC) 25 MG suppository Place rectally.    . hydrocortisone-pramoxine (ANALPRAM HC) 2.5-1 % rectal cream Place 1 application rectally 3 (three) times daily. 30 g 0  . levothyroxine (SYNTHROID, LEVOTHROID) 50 MCG tablet Take 50 mcg by mouth daily before breakfast.    . lithium carbonate 300 MG capsule Take 600 mg by mouth 2 (two) times daily with a meal.    . loratadine (CLARITIN) 10 MG tablet Take 10 mg by mouth daily.    . metaxalone (SKELAXIN) 800 MG tablet Take 1 tablet (800 mg total) by mouth 3 (three) times daily. 30 tablet 1  . Multiple  Vitamin (MULTIVITAMIN WITH MINERALS) TABS Take 1 tablet by mouth daily.    [name] NUVIGIL 150 MG tablet TK 1 T PO QAM  2  . sildenafil (VIAGRA) 100 MG tablet Take 100 mg by mouth as needed for erectile dysfunction.    . traMADol (ULTRAM) 50 MG tablet Take 50 mg by mouth every 6 (six) hours as needed.    . ARIPiprazole (ABILIFY) 2 MG tablet TK 1 T PO QHS  3  . azelastine (ASTELIN) 137 MCG/SPRAY nasal spray Place 1 spray into the nose 2 (two) times daily. Use in each nostril as directed    . zolpidem (AMBIEN) 10 MG tablet Take 10 mg by mouth at bedtime as needed for sleep.     No facility-administered medications prior to visit.    Patient Care Team: Jerrol Banana., MD as PCP - General (Unknown Physician  Specialty) Jerrol Banana., MD (Family Medicine) Seeplaputhur Robinette Haines, MD (General Surgery)     Objective:   Vitals:  Filed Vitals:   07/12/15 0936  BP: 112/80  Pulse: 60  Temp: 97.7 F (36.5 C)  TempSrc: Oral  Resp: 16  Height: 5\' 10"  (1.778 m)  Weight: 205 lb (92.987 kg)    Physical Exam  Constitutional: He is oriented to person, place, and time. He appears well-developed and well-nourished.  HENT:  Head: Normocephalic and atraumatic.  Right Ear: External ear normal.  Left Ear: External ear normal.  Nose: Nose normal.  Mouth/Throat: Oropharynx is clear and moist.  Eyes: Conjunctivae and EOM are normal. Pupils are equal, round, and reactive to light.  Neck: Normal range of motion. Neck supple.  Cardiovascular: Normal rate, regular rhythm, normal heart sounds and intact distal pulses.   Pulmonary/Chest: Effort normal and breath sounds normal.  Abdominal: Soft. Bowel sounds are normal.  Genitourinary: Penis normal.  Musculoskeletal: Normal range of motion.  Neurological: He is alert and oriented to person, place, and time.  Skin: Skin is warm and dry.  Psychiatric: He has a normal mood and affect. His behavior is normal. Judgment and thought content normal.     Depression Screen No flowsheet data found.    Assessment & Plan:     Routine Health Maintenance and Physical Exam  Exercise Activities and Dietary recommendations Goals    None      Immunization History  Administered Date(s) Administered  . Td 02/22/2003  . Tdap 07/02/2011    Health Maintenance  Topic Date Due  . Hepatitis C Screening  10-31-1960  . PNEUMOCOCCAL POLYSACCHARIDE VACCINE (1) 11/29/1960  . FOOT EXAM  11/29/1968  . OPHTHALMOLOGY EXAM  11/29/1968  . URINE MICROALBUMIN  11/29/1968  . HIV Screening  11/29/1973  . HEMOGLOBIN A1C  09/15/2007  . INFLUENZA VACCINE  05/08/2015  . COLONOSCOPY  11/10/2018  . TETANUS/TDAP  07/01/2021   1. Annual physical exam  - CBC With  Differential/Platelet - Lipid Panel With LDL/HDL Ratio - COMPLETE METABOLIC PANEL WITH GFR - TSH - PSA  2. Borderline diabetes  - Hemoglobin A1c  3. Bipolar depression ([place])  - Lithium level 4.DDD Refer to Chiropractor   Discussed health benefits of physical activity, and encouraged him to engage in regular exercise appropriate for his age and condition.    ------------------------------------------------------------------------------------------------------------

## 2015-07-14 ENCOUNTER — Encounter: Payer: Self-pay | Admitting: Family Medicine

## 2015-07-14 LAB — COMPREHENSIVE METABOLIC PANEL
ALT: 28 IU/L (ref 0–44)
AST: 24 IU/L (ref 0–40)
Albumin/Globulin Ratio: 1.8 (ref 1.1–2.5)
Albumin: 4.9 g/dL (ref 3.5–5.5)
Alkaline Phosphatase: 65 IU/L (ref 39–117)
BUN/Creatinine Ratio: 13 (ref 9–20)
BUN: 15 mg/dL (ref 6–24)
Bilirubin Total: 1.1 mg/dL (ref 0.0–1.2)
CO2: 24 mmol/L (ref 18–29)
Calcium: 10.1 mg/dL (ref 8.7–10.2)
Chloride: 103 mmol/L (ref 97–108)
Creatinine, Ser: 1.2 mg/dL (ref 0.76–1.27)
GFR calc Af Amer: 78 mL/min/{1.73_m2} (ref >59)
GFR calc non Af Amer: 67 mL/min/{1.73_m2} (ref >59)
Globulin, Total: 2.8 g/dL (ref 1.5–4.5)
Glucose: 137 mg/dL — ABNORMAL HIGH (ref 65–99)
Potassium: 5.4 mmol/L — ABNORMAL HIGH (ref 3.5–5.2)
Sodium: 140 mmol/L (ref 134–144)
Total Protein: 7.7 g/dL (ref 6.0–8.5)

## 2015-07-14 LAB — HEMOGLOBIN A1C
Est. average glucose Bld gHb Est-mCnc: 137 mg/dL
Hgb A1c MFr Bld: 6.4 % — ABNORMAL HIGH (ref 4.8–5.6)

## 2015-07-14 LAB — LIPID PANEL WITH LDL/HDL RATIO
Cholesterol, Total: 135 mg/dL (ref 100–199)
HDL: 62 mg/dL (ref >39)
LDL Calculated: 62 mg/dL (ref 0–99)
LDl/HDL Ratio: 1 ratio units (ref 0.0–3.6)
Triglycerides: 54 mg/dL (ref 0–149)
VLDL Cholesterol Cal: 11 mg/dL (ref 5–40)

## 2015-07-14 LAB — LITHIUM LEVEL: Lithium Lvl: 0.6 mmol/L (ref 0.6–1.4)

## 2015-07-14 LAB — TSH: TSH: 2.3 u[IU]/mL (ref 0.450–4.500)

## 2015-07-14 LAB — PSA: Prostate Specific Ag, Serum: 0.7 ng/mL (ref 0.0–4.0)

## 2015-07-15 LAB — CBC WITH DIFFERENTIAL/PLATELET
Basophils Absolute: 0 10*3/uL (ref 0.0–0.2)
Basos: 1 %
EOS (ABSOLUTE): 0.1 10*3/uL (ref 0.0–0.4)
Eos: 2 %
Hematocrit: 49.7 % (ref 37.5–51.0)
Hemoglobin: 17 g/dL (ref 12.6–17.7)
Immature Grans (Abs): 0 10*3/uL (ref 0.0–0.1)
Immature Granulocytes: 1 %
Lymphocytes Absolute: 1.1 10*3/uL (ref 0.7–3.1)
Lymphs: 19 %
MCH: 31.2 pg (ref 26.6–33.0)
MCHC: 34.2 g/dL (ref 31.5–35.7)
MCV: 91 fL (ref 79–97)
Monocytes Absolute: 0.5 10*3/uL (ref 0.1–0.9)
Monocytes: 8 %
Neutrophils Absolute: 3.9 10*3/uL (ref 1.4–7.0)
Neutrophils: 69 %
Platelets: 227 10*3/uL (ref 150–379)
RBC: 5.45 x10E6/uL (ref 4.14–5.80)
RDW: 14.1 % (ref 12.3–15.4)
WBC: 5.6 10*3/uL (ref 3.4–10.8)

## 2015-07-15 LAB — SPECIMEN STATUS REPORT

## 2015-07-17 ENCOUNTER — Telehealth: Payer: Self-pay | Admitting: Family Medicine

## 2015-07-17 NOTE — Telephone Encounter (Signed)
Pt returning call from [place]. CB# [phone] CC

## 2015-07-17 NOTE — Telephone Encounter (Signed)
Pt informed and voiced understanding of results. 

## 2015-07-18 ENCOUNTER — Telehealth: Payer: Self-pay

## 2015-07-18 NOTE — Telephone Encounter (Signed)
-----   Message from Jerrol Banana., MD sent at 07/17/2015  4:06 PM EDT ----- Labs ok

## 2015-07-18 NOTE — Telephone Encounter (Signed)
Tried calling patient, but unable to leave message because mailbox was full. Will try again later.

## 2015-07-19 NOTE — Telephone Encounter (Signed)
Mailbox full ED

## 2015-07-20 NOTE — Telephone Encounter (Signed)
Pt is returning call.  MN#[phone]/UX

## 2015-07-20 NOTE — Telephone Encounter (Signed)
No answer-aa 

## 2015-07-21 NOTE — Telephone Encounter (Signed)
Pt advised on voicemail-aa 

## 2015-08-14 ENCOUNTER — Telehealth: Payer: Self-pay | Admitting: Family Medicine

## 2015-08-14 NOTE — Telephone Encounter (Signed)
Beth at ENT office advised-aa

## 2015-08-14 NOTE — Telephone Encounter (Signed)
Kathlee Nations, Dr. Darien Ramus nurse, wants to know if they can add HCTZ to Danny's meds.  Dr. Pryor Ochoa said he needed it for [place] but there may be interaction with his Lithium.  Please advise.

## 2015-08-14 NOTE — Telephone Encounter (Signed)
Ok to try HCTZ

## 2015-09-07 ENCOUNTER — Other Ambulatory Visit: Payer: Self-pay | Admitting: Family Medicine

## 2015-09-07 NOTE — Telephone Encounter (Signed)
Pt contacted office for refill request on the following medications:  buPROPion (WELLBUTRIN XL) 300 MG 24 hr tablet.  [employer].  [phone]  Pt states he was getting this with Dr Bridgett Larsson and his office has closed.  Pt thought he has 1 more refill but he did not have any refills for this.  Pt has been without this medication for 3 days now/MW  Pt is requesting a referral to see another psychiatrist/MW

## 2015-09-07 NOTE — Telephone Encounter (Signed)
Please advise below.

## 2015-09-08 NOTE — Telephone Encounter (Signed)
Please rf for 2 months and have him see me in dec/jan.[name] thx

## 2015-09-11 MED ORDER — BUPROPION HCL ER (XL) 300 MG PO TB24: 300.0000 mg | ORAL_TABLET | Freq: Every day | ORAL | Status: DC

## 2015-09-11 NOTE — Telephone Encounter (Signed)
RX sent and appt made-aa

## 2015-09-12 ENCOUNTER — Ambulatory Visit (INDEPENDENT_AMBULATORY_CARE_PROVIDER_SITE_OTHER): Payer: 59 | Admitting: Family Medicine

## 2015-09-12 VITALS — BP 118/76 | HR 82 | Temp 97.5°F | Resp 16 | Ht 70.0 in | Wt 216.0 lb

## 2015-09-12 DIAGNOSIS — R05 Cough: Secondary | ICD-10-CM | POA: Diagnosis not present

## 2015-09-12 DIAGNOSIS — J40 Bronchitis, not specified as acute or chronic: Secondary | ICD-10-CM | POA: Diagnosis not present

## 2015-09-12 DIAGNOSIS — R42 Dizziness and giddiness: Secondary | ICD-10-CM | POA: Diagnosis not present

## 2015-09-12 DIAGNOSIS — R110 Nausea: Secondary | ICD-10-CM | POA: Diagnosis not present

## 2015-09-12 DIAGNOSIS — R059 Cough, unspecified: Secondary | ICD-10-CM

## 2015-09-12 MED ORDER — MECLIZINE HCL 25 MG PO TABS: 25.0000 mg | ORAL_TABLET | Freq: Three times a day (TID) | ORAL | Status: DC | PRN

## 2015-09-12 MED ORDER — AZITHROMYCIN 250 MG PO TABS: ORAL_TABLET | ORAL | Status: DC

## 2015-09-12 NOTE — Progress Notes (Signed)
Patient ID: Jeremiah [name], male   DOB: 05-03-1961, 54 y.o.   MRN: [id number]   Jeremiah [name]  MRN: [id number] DOB: 1960/11/17  Subjective:  HPI   1. Cough The patient is a 54 year old male who presents for evaluation of cough.  He started with the cough 1 week ago.  It is non productive.  He has not had any fever.  He does admit to head congestion and vertigo in the morning.  2. Nausea Patient has also had nausea that he at first thought was from eating some bad BBQ.  He has not been able to get rid of the nausea but has not actually vomited.   Patient Active Problem List   Diagnosis Date Noted  . Pedal edema 03/08/2015  . Diabetes ([place]) 08/31/2013  . BP (high blood pressure) 08/31/2013  . Adult hypothyroidism 08/31/2013  . Obstructive apnea 08/31/2013  . Idiopathic localized osteoarthropathy 05/31/2013  . ABDOMINAL PAIN, LEFT UPPER QUADRANT 10/04/2009  . TARSAL TUNNEL SYNDROME, RIGHT 09/25/2009  . ANKLE PAIN, RIGHT 09/25/2009  . OTHER ANKLE SPRAIN AND STRAIN 09/25/2009  . BREAST MASS, RIGHT 01/07/2008  . INSOMNIA 01/07/2008  . GASTRIC ULCER, ACUTE 11/09/2007  . DUODENITIS 11/09/2007  . BLOOD IN STOOL, OCCULT 11/09/2007  . PERSONAL HISTORY OF COLONIC POLYPS 11/09/2007  . BACK PAIN, LUMBAR 10/12/2007  . HELICOBACTER PYLORI GASTRITIS 09/09/2007  . GASTRIC ULCER, ACUTE, HEMORRHAGE 09/09/2007  . HIATAL HERNIA 09/09/2007  . COLONIC POLYPS, ADENOMATOUS 08/19/2007  . INTERNAL HEMORRHOIDS 08/19/2007  . EXTERNAL HEMORRHOIDS WITHOUT MENTION COMP 08/07/2007  . Blood in stool 08/07/2007  . ABDOMINAL PAIN, LEFT LOWER QUADRANT 08/07/2007  . SINUSITIS, ACUTE 06/12/2007  . ABDOMINAL PAIN 06/12/2007  . DIABETES MELLITUS, TYPE II 03/19/2007  . GOUT 03/19/2007  . DISORDER, CIRCADIAN RHYTHM SLEEP, NONORGANIC 03/19/2007  . DEPRESSION 03/19/2007  . OSTEOARTHROSIS, GENERALIZED, MULTIPLE SITES 03/19/2007    Past Medical History  Diagnosis Date  . Hypertension   . Sleep apnea     dx 1.5  yr ago...study @ [facility]  . Arthritis     bil knees  . Hypothyroidism   . Anxiety   . Depression     dx 2004  . H/O hiatal hernia     had surgery for that 2012  [facility]  . Diabetes mellitus without complication ([place])     type ll  controlled by weight & diet  . Colon polyp Dec 2015    Dr Allen Norris  . Sleep apnea     uses c pap    Social History   Social History  . Marital Status: Married    Spouse Name: N/A  . Number of Children: N/A  . Years of Education: N/A   Occupational History  . Not on file.   Social History Main Topics  . Smoking status: Never Smoker   . Smokeless tobacco: Former Systems developer    Types: [place] date: 10/07/1989  . Alcohol Use: 0.0 oz/week    0 Standard drinks or equivalent per week     Comment: 2 beers per day  . Drug Use: No  . Sexual Activity: Not on file   Other Topics Concern  . Not on file   Social History Narrative    Outpatient Prescriptions Prior to Visit  Medication Sig Dispense Refill  . amLODipine (NORVASC) 10 MG tablet Take 5 mg by mouth daily.    Renae Gloss 4 MG/24HR PT24 patch     . ARIPiprazole (ABILIFY)  2 MG tablet     . buPROPion (WELLBUTRIN XL) 300 MG 24 hr tablet Take 1 tablet (300 mg total) by mouth daily. 30 tablet 2  . HYDROcodone-acetaminophen (NORCO/VICODIN) 5-325 MG per tablet Take 1 tablet by mouth every 6 (six) hours as needed for moderate pain. 28 tablet 0  . hydrocortisone (ANUSOL-HC) 25 MG suppository Place rectally.    . hydrocortisone-pramoxine (ANALPRAM HC) 2.5-1 % rectal cream Place 1 application rectally 3 (three) times daily. 30 g 0  . levothyroxine (SYNTHROID, LEVOTHROID) 50 MCG tablet Take 50 mcg by mouth daily before breakfast.    . lithium carbonate 300 MG capsule Take 600 mg by mouth 2 (two) times daily with a meal.    . loratadine (CLARITIN) 10 MG tablet Take 10 mg by mouth daily.    . Multiple Vitamin (MULTIVITAMIN WITH MINERALS) TABS Take 1 tablet by mouth daily.    [name] NUVIGIL 150 MG tablet TK 1 T  PO QAM  2  . sildenafil (VIAGRA) 100 MG tablet Take 100 mg by mouth as needed for erectile dysfunction.    . traMADol (ULTRAM) 50 MG tablet Take 50 mg by mouth every 6 (six) hours as needed.    . metaxalone (SKELAXIN) 800 MG tablet Take 1 tablet (800 mg total) by mouth 3 (three) times daily. 30 tablet 1   No facility-administered medications prior to visit.    Allergies  Allergen Reactions  . Tequin [Gatifloxacin] Anaphylaxis and Rash  . Amoxicillin Hives    Review of Systems  Constitutional: Positive for malaise/fatigue and diaphoresis. Negative for fever, chills and weight loss.  HENT: Positive for congestion and tinnitus. Negative for ear discharge, ear pain, hearing loss, nosebleeds and sore throat.   Eyes: Negative for blurred vision, double vision, photophobia, pain, discharge and redness.  Respiratory: Positive for cough and shortness of breath. Negative for hemoptysis, sputum production, wheezing and stridor.   Cardiovascular: Negative for chest pain, palpitations, orthopnea and leg swelling.  Neurological: Negative.  Negative for headaches.  Psychiatric/Behavioral: Negative.    Objective:  BP 118/76 mmHg  Pulse 82  Temp(Src) 97.5 F (36.4 C) (Oral)  Resp 16  Ht 5\' 10"  (1.778 m)  Wt 216 lb (97.977 kg)  BMI 30.99 kg/m2  Physical Exam  Constitutional: He is oriented to person, place, and time and well-developed, well-nourished, and in no distress.  HENT:  Head: Normocephalic and atraumatic.  Nystagmus to the left  Eyes: Conjunctivae are normal. Pupils are equal, round, and reactive to light.  Neck: Normal range of motion. Neck supple.  Cardiovascular: Normal rate, regular rhythm and normal heart sounds.   Pulmonary/Chest: Effort normal and breath sounds normal.  Abdominal: Soft. Bowel sounds are normal.  Neurological: He is alert and oriented to person, place, and time.  Skin: Skin is warm and dry.  Psychiatric: Mood, memory, affect and judgment normal.     Assessment and Plan :   1. Cough Recommend Robitussin DM for his cough  2. Nausea  - meclizine (ANTIVERT) 25 MG tablet; Take 1 tablet (25 mg total) by mouth 3 (three) times daily as needed for dizziness.  Dispense: 30 tablet; Refill: 0  Patient is to let us know if this does not help his nausea.  3. Vertigo  - meclizine (ANTIVERT) 25 MG tablet; Take 1 tablet (25 mg total) by mouth 3 (three) times daily as needed for dizziness.  Dispense: 30 tablet; Refill: 0  4. Bronchitis  - azithromycin (ZITHROMAX) 250 MG tablet; 2 po day 1,  then 1 daily x 4 days.  Dispense: 6 tablet; Refill: 0  I have done the exam and reviewed the above chart and it is accurate to the best of my knowledge.  Miguel Aschoff MD [facility] 09/12/2015 11:04 AM

## 2015-09-20 ENCOUNTER — Ambulatory Visit: Payer: 59 | Admitting: Family Medicine

## 2015-09-21 ENCOUNTER — Encounter: Payer: Self-pay | Admitting: Family Medicine

## 2015-09-21 ENCOUNTER — Ambulatory Visit (INDEPENDENT_AMBULATORY_CARE_PROVIDER_SITE_OTHER): Payer: 59 | Admitting: Family Medicine

## 2015-09-21 VITALS — BP 108/60 | HR 68 | Temp 97.7°F | Resp 16 | Wt 217.0 lb

## 2015-09-21 DIAGNOSIS — E291 Testicular hypofunction: Secondary | ICD-10-CM | POA: Diagnosis not present

## 2015-09-21 DIAGNOSIS — F329 Major depressive disorder, single episode, unspecified: Secondary | ICD-10-CM

## 2015-09-21 DIAGNOSIS — F32A Depression, unspecified: Secondary | ICD-10-CM

## 2015-09-21 DIAGNOSIS — E038 Other specified hypothyroidism: Secondary | ICD-10-CM | POA: Diagnosis not present

## 2015-09-21 DIAGNOSIS — R5383 Other fatigue: Secondary | ICD-10-CM

## 2015-09-21 NOTE — Progress Notes (Signed)
Patient ID: Jeremiah [name], male   DOB: [birth year], 54 y.o.   MRN: [id number]    Subjective:  HPI Pt is here follow up of depression. He used to see Dr. Bridgett Larsson but he is not longer practicing so he will need another referral to a Psy that takes his insurance.   He also wants to see if his testosterone can be increased because last time (about 4 months) that he had it checked it was on the lower end of normal, He is having fatigue and low sex drive, he feels like it could be from this. He also needs a refill on his Levothyroxine.  He understands that the low testosterone treatment as a quality life issue only.    Prior to Admission medications   Medication Sig Start Date End Date Taking? Authorizing Provider  amLODipine (NORVASC) 10 MG tablet Take 5 mg by mouth daily.   Yes Historical Provider, MD  ANDRODERM 4 MG/24HR PT24 patch  04/02/15  Yes Historical Provider, MD  ARIPiprazole (ABILIFY) 2 MG tablet  06/28/15  Yes Historical Provider, MD  buPROPion (WELLBUTRIN XL) 300 MG 24 hr tablet Take 1 tablet (300 mg total) by mouth daily. 09/11/15  Yes Richard Maceo Pro., MD  hydrochlorothiazide (HYDRODIURIL) 25 MG tablet  09/15/15  Yes Historical Provider, MD  HYDROcodone-acetaminophen (NORCO/VICODIN) 5-325 MG per tablet Take 1 tablet by mouth every 6 (six) hours as needed for moderate pain. 05/23/15  Yes Carmon Ginsberg, PA  hydrocortisone (ANUSOL-HC) 25 MG suppository Place rectally. 06/10/14  Yes Historical Provider, MD  hydrocortisone-pramoxine (ANALPRAM HC) 2.5-1 % rectal cream Place 1 application rectally 3 (three) times daily. 01/05/15  Yes Seeplaputhur Robinette Haines, MD  levothyroxine (SYNTHROID, LEVOTHROID) 50 MCG tablet Take 50 mcg by mouth daily before breakfast.   Yes Historical Provider, MD  lithium carbonate 300 MG capsule Take 600 mg by mouth 2 (two) times daily with a meal.   Yes Historical Provider, MD  loratadine (CLARITIN) 10 MG tablet Take 10 mg by mouth daily.   Yes Historical Provider, MD    meclizine (ANTIVERT) 25 MG tablet Take 1 tablet (25 mg total) by mouth 3 (three) times daily as needed for dizziness. 09/12/15  Yes Richard Maceo Pro., MD  meloxicam [facility]) 15 MG tablet  08/02/15  Yes Historical Provider, MD  Multiple Vitamin (MULTIVITAMIN WITH MINERALS) TABS Take 1 tablet by mouth daily.   Yes Historical Provider, MD  NUVIGIL 150 MG tablet TK 1 T PO QAM 04/18/15  Yes Historical Provider, MD  sildenafil (VIAGRA) 100 MG tablet Take 100 mg by mouth as needed for erectile dysfunction.   Yes Historical Provider, MD  traMADol (ULTRAM) 50 MG tablet Take 50 mg by mouth every 6 (six) hours as needed.   Yes Historical Provider, MD  zolpidem (AMBIEN) 10 MG tablet  08/30/15  Yes Historical Provider, MD    Patient Active Problem List   Diagnosis Date Noted  . Pedal edema 03/08/2015  . Diabetes ([place]) 08/31/2013  . BP (high blood pressure) 08/31/2013  . Adult hypothyroidism 08/31/2013  . Obstructive apnea 08/31/2013  . Idiopathic localized osteoarthropathy 05/31/2013  . ABDOMINAL PAIN, LEFT UPPER QUADRANT 10/04/2009  . TARSAL TUNNEL SYNDROME, RIGHT 09/25/2009  . ANKLE PAIN, RIGHT 09/25/2009  . OTHER ANKLE SPRAIN AND STRAIN 09/25/2009  . BREAST MASS, RIGHT 01/07/2008  . INSOMNIA 01/07/2008  . GASTRIC ULCER, ACUTE 11/09/2007  . DUODENITIS 11/09/2007  . BLOOD IN STOOL, OCCULT 11/09/2007  . PERSONAL HISTORY OF COLONIC POLYPS 11/09/2007  .  BACK PAIN, LUMBAR 10/12/2007  . HELICOBACTER PYLORI GASTRITIS 09/09/2007  . GASTRIC ULCER, ACUTE, HEMORRHAGE 09/09/2007  . HIATAL HERNIA 09/09/2007  . COLONIC POLYPS, ADENOMATOUS 08/19/2007  . INTERNAL HEMORRHOIDS 08/19/2007  . EXTERNAL HEMORRHOIDS WITHOUT MENTION COMP 08/07/2007  . Blood in stool 08/07/2007  . ABDOMINAL PAIN, LEFT LOWER QUADRANT 08/07/2007  . SINUSITIS, ACUTE 06/12/2007  . ABDOMINAL PAIN 06/12/2007  . DIABETES MELLITUS, TYPE II 03/19/2007  . GOUT 03/19/2007  . DISORDER, CIRCADIAN RHYTHM SLEEP, NONORGANIC 03/19/2007  .  DEPRESSION 03/19/2007  . OSTEOARTHROSIS, GENERALIZED, MULTIPLE SITES 03/19/2007    Past Medical History  Diagnosis Date  . Hypertension   . Sleep apnea     dx 1.5 yr ago...study @ [facility]  . Arthritis     bil knees  . Hypothyroidism   . Anxiety   . Depression     dx 2004  . H/O hiatal hernia     had surgery for that 2012  [facility]  . Diabetes mellitus without complication ([place])     type ll  controlled by weight & diet  . Colon polyp Dec 2015    Dr Allen Norris  . Sleep apnea     uses c pap    Social History   Social History  . Marital Status: Married    Spouse Name: N/A  . Number of Children: N/A  . Years of Education: N/A   Occupational History  . Not on file.   Social History Main Topics  . Smoking status: Never Smoker   . Smokeless tobacco: Former Systems developer    Types: [place] date: 10/07/1989  . Alcohol Use: 0.0 oz/week    0 Standard drinks or equivalent per week     Comment: 2 beers per day  . Drug Use: No  . Sexual Activity: Not on file   Other Topics Concern  . Not on file   Social History Narrative    Allergies  Allergen Reactions  . Tequin [Gatifloxacin] Anaphylaxis and Rash  . Amoxicillin Hives    Review of Systems  Constitutional: Positive for malaise/fatigue.  HENT: Negative.   Eyes: Negative.   Respiratory: Negative.   Gastrointestinal: Negative.   Genitourinary: Negative.        Low sex drive  Musculoskeletal: Negative.   Skin: Negative.   Neurological: Negative.   Endo/Heme/Allergies: Negative.   Psychiatric/Behavioral: Negative.     Immunization History  Administered Date(s) Administered  . Td 02/22/2003  . Tdap 07/02/2011   Objective:  BP 108/60 mmHg  Pulse 68  Temp(Src) 97.7 F (36.5 C) (Oral)  Resp 16  Wt 217 lb (98.431 kg)  Physical Exam  Constitutional: He is oriented to person, place, and time and well-developed, well-nourished, and in no distress.  HENT:  Head: Normocephalic and atraumatic.  Right Ear: External  ear normal.  Left Ear: External ear normal.  Eyes: Conjunctivae are normal.  Neck: Neck supple.  Cardiovascular: Normal rate, regular rhythm and normal heart sounds.   Pulmonary/Chest: Effort normal and breath sounds normal.  Abdominal: Soft.  Neurological: He is alert and oriented to person, place, and time.  Skin: Skin is warm and dry.  Psychiatric: Mood, memory, affect and judgment normal.    Lab Results  Component Value Date   WBC 5.6 07/12/2015   HGB 14.5 12/14/2014   HCT 49.7 07/12/2015   PLT 222 12/14/2014   GLUCOSE 137* 07/12/2015   CHOL 135 07/12/2015   TRIG 54 07/12/2015   HDL 62 07/12/2015  LDLCALC 62 07/12/2015   TSH 2.300 07/12/2015   PSA 0.7 07/12/2015   HGBA1C 6.4* 07/12/2015    CMP     Component Value Date/Time   NA 140 07/12/2015 1028   NA 134* 03/02/2013 1007   NA 141 10/27/2011 0010   K 5.4* 07/12/2015 1028   K 5.1 10/27/2011 0010   CL 103 07/12/2015 1028   CL 103 10/27/2011 0010   CO2 24 07/12/2015 1028   CO2 25 10/27/2011 0010   GLUCOSE 137* 07/12/2015 1028   GLUCOSE 78 03/02/2013 1007   GLUCOSE 135* 10/27/2011 0010   BUN 15 07/12/2015 1028   BUN 15 03/02/2013 1007   BUN 19* 10/27/2011 0010   CREATININE 1.20 07/12/2015 1028   CREATININE 0.98 10/27/2011 0010   CALCIUM 10.1 07/12/2015 1028   CALCIUM 8.5 10/27/2011 0010   PROT 7.7 07/12/2015 1028   PROT 7.2 02/12/2013 0958   PROT 7.0 10/27/2011 0010   ALBUMIN 4.9 07/12/2015 1028   ALBUMIN 4.0 02/12/2013 0958   ALBUMIN 3.6 10/27/2011 0010   AST 24 07/12/2015 1028   AST 62* 10/27/2011 0010   ALT 28 07/12/2015 1028   ALT 33 10/27/2011 0010   ALKPHOS 65 07/12/2015 1028   ALKPHOS 31* 10/27/2011 0010   BILITOT 1.1 07/12/2015 1028   BILITOT 0.7 02/12/2013 0958   BILITOT 0.6 10/27/2011 0010   GFRNONAA 67 07/12/2015 1028   GFRNONAA >60 10/27/2011 0010   GFRAA 78 07/12/2015 1028   GFRAA >60 10/27/2011 0010    Assessment and Plan :  1. Hypogonadism male  - Testosterone, Free, Total,  SHBG - Prolactin  2. Other specified hypothyroidism  - TSH  3. Other fatigue  - TSH  4. Depression Advised patient that due to bipolar depression needs to always have psychiatrist. - Ambulatory referral to Psychiatry I have done the exam and reviewed the above chart and it is accurate to the best of my knowledge. 5. Hypertension 6. Hypothyroidism  Patient was seen and examined by Dr. Miguel Aschoff, and noted scribed by Webb Laws, [place] MD [facility] 09/21/2015 2:56 PM

## 2015-10-05 ENCOUNTER — Ambulatory Visit: Payer: 59 | Admitting: Licensed Clinical Social Worker

## 2015-10-08 LAB — TESTOSTERONE, FREE, TOTAL, SHBG
Sex Hormone Binding: 17.8 nmol/L — ABNORMAL LOW (ref 19.3–76.4)
Testosterone, Free: 4.8 pg/mL — ABNORMAL LOW (ref 7.2–24.0)
Testosterone: 164 ng/dL — ABNORMAL LOW (ref 348–1197)

## 2015-10-08 LAB — TSH: TSH: 2.74 u[IU]/mL (ref 0.450–4.500)

## 2015-10-08 LAB — PROLACTIN: Prolactin: 14.8 ng/mL (ref 4.0–15.2)

## 2015-10-17 ENCOUNTER — Telehealth: Payer: Self-pay | Admitting: Family Medicine

## 2015-10-17 NOTE — Telephone Encounter (Signed)
Pt stated that he was informed by our office that his testosterone level was low and wanted to know what he should do about it being low. Please advise. Thanks TNP

## 2015-10-17 NOTE — Telephone Encounter (Signed)
Please review-aa 

## 2015-10-18 NOTE — Telephone Encounter (Signed)
Pt called back to see about what he should do about his testosterone level. Please advise. Thanks TNP

## 2015-10-18 NOTE — Telephone Encounter (Signed)
Would patient like to try AndroGel.?It takes several months to help with the fatigue and decreased libido. Dose is 1.62%, 2 pumps daily applied to skin as directed

## 2015-10-18 NOTE — Telephone Encounter (Signed)
LMTCB

## 2015-10-20 MED ORDER — TESTOSTERONE 40.5 MG/2.5GM (1.62%) TD GEL: TRANSDERMAL | Status: DC

## 2015-10-20 NOTE — Telephone Encounter (Signed)
Done-aa 

## 2015-10-20 NOTE — Telephone Encounter (Signed)
Patient is willing to try AndroGel. Tried sending into the pharmacy ([facility]), but 2 doses of 1.62% popped up. Would you like patient to have AndroGel 20.25mg /1.25GM or 40.5mg /2.5GM? Both say 1.62%. Please advise. Thanks!

## 2015-10-20 NOTE — Telephone Encounter (Signed)
Higher dose--2 pumps daily.

## 2015-10-24 ENCOUNTER — Ambulatory Visit (INDEPENDENT_AMBULATORY_CARE_PROVIDER_SITE_OTHER): Payer: 59 | Admitting: Licensed Clinical Social Worker

## 2015-10-24 DIAGNOSIS — F3130 Bipolar disorder, current episode depressed, mild or moderate severity, unspecified: Secondary | ICD-10-CM | POA: Diagnosis not present

## 2015-10-24 DIAGNOSIS — F319 Bipolar disorder, unspecified: Secondary | ICD-10-CM

## 2015-10-24 NOTE — Progress Notes (Signed)
Comprehensive Clinical Assessment (CCA) Note  10/24/2015 Jeremiah [name] [id number]  Visit Diagnosis:      ICD-9-CM ICD-10-CM   1. Bipolar depression ([place]) 296.50 F31.30       CCA Part One  Part One has been completed on paper by the patient.  (See scanned document in Chart Review)  CCA Part Two A  Intake/Chief Complaint:  CCA Intake With Chief Complaint CCA Part Two Date: 10/24/15 CCA Part Two Time: 1500 Chief Complaint/Presenting Problem: Dr. Bridgett Larsson closed his practice Patients Currently Reported Symptoms/Problems: depression: feeling blue, no energy, thoughts of suicide Individual's Strengths: "I don't know really.  I guess I am ok at work." Individual's Preferences: to receive medication from Dr. Bridgett Larsson Individual's Abilities: take medication as prescribed  Mental Health Symptoms Depression:  Depression: Change in energy/activity, Difficulty Concentrating, Fatigue, Increase/decrease in appetite, Irritability, Sleep (too much or little), Tearfulness, Weight gain/loss  Mania:  Mania: Change in energy/activity, Irritability  Anxiety:   Anxiety: Difficulty concentrating, Irritability, Worrying  Psychosis:  Psychosis: N/A  Trauma:  Trauma: N/A  Obsessions:  Obsessions: N/A  Compulsions:  Compulsions: N/A  Inattention:  Inattention: N/A  Hyperactivity/Impulsivity:  Hyperactivity/Impulsivity: N/A  Oppositional/Defiant Behaviors:  Oppositional/Defiant Behaviors: N/A  Borderline Personality:  Emotional Irregularity: N/A  Other Mood/Personality Symptoms:      Mental Status Exam Appearance and self-care  Stature:  Stature: Average  Weight:  Weight: Overweight  Clothing:  Clothing: Casual  Grooming:  Grooming: Normal  Cosmetic use:  Cosmetic Use: None  Posture/gait:  Posture/Gait: Normal  Motor activity:  Motor Activity: Not Remarkable  Sensorium  Attention:  Attention: Normal  Concentration:  Concentration: Normal  Orientation:  Orientation: X5  Recall/memory:  Recall/Memory:  Normal  Affect and Mood  Affect:  Affect: Depressed  Mood:  Mood: Depressed  Relating  Eye contact:  Eye Contact: Normal  Facial expression:  Facial Expression: Responsive  Attitude toward examiner:  Attitude Toward Examiner: Cooperative  Thought and Language  Speech flow: Speech Flow: Normal  Thought content:     Preoccupation:     Hallucinations:     Organization:     Transport planner of Knowledge:  Fund of Knowledge: Average  Intelligence:  Intelligence: Average  Abstraction:  Abstraction: Normal  Judgement:  Judgement: Normal  Reality Testing:  Reality Testing: Adequate  Insight:  Insight: Good  Decision Making:  Decision Making: Normal  Social Functioning  Social Maturity:  Social Maturity: Responsible  Social Judgement:  Social Judgement: Normal  Stress  Stressors:  Stressors: Work, Transitions  Coping Ability:  Coping Ability: English as a second language teacher Deficits:     Supports:      Family and Psychosocial History: Family history Marital status: Married (on his 2nd marriage; 1st was 5 yrs) Number of Years Married: 3 What types of issues is patient dealing with in the relationship?: denies Are you sexually active?: No Does patient have children?: Yes How many children?: 1 How is patient's relationship with their children?: Philip, 15; has one Granddaughter  Childhood History:  Childhood History By whom was/is the patient raised?: Both parents Additional childhood history information: parents seperated when he was 31.  Began living with mother Description of patient's relationship with caregiver when they were a child: good, great Patient's description of current relationship with people who raised him/her: Mother: good Father good How were you disciplined when you got in trouble as a child/adolescent?: Dad: after "3 prompts he would spank [place]" Mother: she would tell us how disappointed she was in [place]  Does patient have siblings?: Yes Number of Siblings: 1 (younger  sister) Description of patient's current relationship with siblings: good Did patient suffer any verbal/emotional/physical/sexual abuse as a child?: No Did patient suffer from severe childhood neglect?: No Has patient ever been sexually abused/assaulted/raped as an adolescent or adult?: No Was the patient ever a victim of a crime or a disaster?: No Witnessed domestic violence?: No Has patient been effected by domestic violence as an adult?: No  CCA Part Two B  Employment/Work Situation: Employment / Work Copywriter, advertising Employment situation: Employed 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 long has patient been employed?: 21yrs Patient's job has been impacted by current illness: Yes Describe how patient's job has been impacted: hard time with memory and concentration What is the longest time patient has a held a job?: 68yrs Where was the patient employed at that time?: [employer] Has patient ever been in the [employer]?: Yes (Describe in comment) (4 yrs in Corporate treasurer) Has patient ever served in combat?: No Did You Receive Any Psychiatric Treatment/Services While in the [employer]?: No Are There Guns or Other Weapons in [place]?: Yes (has a Teacher, English as a foreign language but does not have bullets) Types of Guns/Weapons: Teacher, English as a foreign language; a "poker" gun (small concealed carry gun) Who Could Verify You Are Able To Have These Secured:: Johntavious Brickler (wife) [phone]  Education: Education Last Grade Completed: 13 Name of [place]: [employer] Did Teacher, adult education From [employer]?: Yes Did Physicist, medical?: Yes (attended [employer] for one year) Did [place]?: No Did You Have An Individualized Education Program (IIEP): No Did You Have Any Difficulty At School?: No  Religion: Religion/Spirituality Are You A Religious Person?: Yes What is Your Religious Affiliation?: Baptist How Might This Affect Treatment?: denies  Leisure/Recreation: Leisure / Recreation Leisure and Hobbies:  gardening, swimming (has a inground pool), working out  Exercise/Diet: Exercise/Diet Do You Exercise?: Yes What Type of Exercise Do You Do?: Run/Walk, Weight Training How Many Times a Week Do You Exercise?: 1-3 times a week Have You Gained or Lost A Significant Amount of Weight in the Past Six Months?: Yes-Gained Number of Pounds Gained: 10 Do You Follow a Special Diet?: No Do You Have Any Trouble Sleeping?: Yes Explanation of Sleeping Difficulties: wakes up several times throughout the night; tired throughout the day  CCA Part Two C  Alcohol/Drug Use: Alcohol / Drug Use Pain Medications: Vicodin 5 325mg , Tramadol Prescriptions: Lithium 1200mg , Wellbutrin 300mg , Meloxicam 15mg , Ambien 10mg , Androderm 4mg , Claritin 10mg  Over the Counter: denies History of alcohol / drug use?: Yes Substance #1 Name of Substance 1: Alcohol 1 - Age of First Use: 13 1 - Amount (size/oz): 12 pack of [place] beer a day while in [employer]; a pint of Rum few times per week 1 - Frequency: daily 1 - Duration: 2 years 1 - Last Use / Amount: 1981                    CCA Part Three  ASAM's:  Six Dimensions of Multidimensional Assessment  Dimension 1:  Acute Intoxication and/or Withdrawal Potential:     Dimension 2:  Biomedical Conditions and Complications:     Dimension 3:  Emotional, Behavioral, or Cognitive Conditions and Complications:     Dimension 4:  Readiness to Change:     Dimension 5:  Relapse, Continued use, or Continued Problem Potential:     Dimension 6:  Recovery/Living Environment:      Substance use  Disorder (SUD)    Social Function:  Social Functioning Social Maturity: Responsible Social Judgement: Normal  Stress:  Stress Stressors: Work, Transitions Coping Ability: Overwhelmed Patient Takes Medications The Way The Doctor Instructed?: Yes Priority Risk: Moderate Risk  Risk Assessment- Self-Harm Potential: Risk Assessment For Self-Harm Potential Thoughts of Self-Harm: No  current thoughts Method: No plan Availability of Means: No access/NA  Risk Assessment -Dangerous to Others Potential: Risk Assessment For Dangerous to Others Potential Method: No Plan Availability of Means: No access or NA Intent: Vague intent or NA Notification Required: No need or identified person  DSM5 Diagnoses: Patient Active Problem List   Diagnosis Date Noted  . Pedal edema 03/08/2015  . Diabetes ([place]) 08/31/2013  . BP (high blood pressure) 08/31/2013  . Adult hypothyroidism 08/31/2013  . Obstructive apnea 08/31/2013  . Idiopathic localized osteoarthropathy 05/31/2013  . ABDOMINAL PAIN, LEFT UPPER QUADRANT 10/04/2009  . TARSAL TUNNEL SYNDROME, RIGHT 09/25/2009  . ANKLE PAIN, RIGHT 09/25/2009  . OTHER ANKLE SPRAIN AND STRAIN 09/25/2009  . BREAST MASS, RIGHT 01/07/2008  . INSOMNIA 01/07/2008  . GASTRIC ULCER, ACUTE 11/09/2007  . DUODENITIS 11/09/2007  . BLOOD IN STOOL, OCCULT 11/09/2007  . PERSONAL HISTORY OF COLONIC POLYPS 11/09/2007  . BACK PAIN, LUMBAR 10/12/2007  . HELICOBACTER PYLORI GASTRITIS 09/09/2007  . GASTRIC ULCER, ACUTE, HEMORRHAGE 09/09/2007  . HIATAL HERNIA 09/09/2007  . COLONIC POLYPS, ADENOMATOUS 08/19/2007  . INTERNAL HEMORRHOIDS 08/19/2007  . EXTERNAL HEMORRHOIDS WITHOUT MENTION COMP 08/07/2007  . Blood in stool 08/07/2007  . ABDOMINAL PAIN, LEFT LOWER QUADRANT 08/07/2007  . SINUSITIS, ACUTE 06/12/2007  . ABDOMINAL PAIN 06/12/2007  . DIABETES MELLITUS, TYPE II 03/19/2007  . GOUT 03/19/2007  . DISORDER, CIRCADIAN RHYTHM SLEEP, NONORGANIC 03/19/2007  . Depression 03/19/2007  . OSTEOARTHROSIS, GENERALIZED, MULTIPLE SITES 03/19/2007    Patient Centered Plan: Patient is on the following Treatment Plan(s):  Depression  Recommendations for Services/Supports/Treatments: Recommendations for Services/Supports/Treatments Recommendations For Services/Supports/Treatments: Individual Therapy, Medication Management  Treatment Plan Summary:     Referrals to Alternative Service(s): Referred to Alternative Service(s):   Place:   Date:   Time:    Referred to Alternative Service(s):   Place:   Date:   Time:    Referred to Alternative Service(s):   Place:   Date:   Time:    Referred to Alternative Service(s):   Place:   Date:   Time:     [place]

## 2015-10-31 ENCOUNTER — Encounter: Payer: Self-pay | Admitting: Psychiatry

## 2015-10-31 ENCOUNTER — Ambulatory Visit (INDEPENDENT_AMBULATORY_CARE_PROVIDER_SITE_OTHER): Payer: 59 | Admitting: Psychiatry

## 2015-10-31 VITALS — BP 122/88 | HR 70 | Temp 97.4°F | Ht 70.0 in | Wt 213.2 lb

## 2015-10-31 DIAGNOSIS — F1020 Alcohol dependence, uncomplicated: Secondary | ICD-10-CM

## 2015-10-31 DIAGNOSIS — F3130 Bipolar disorder, current episode depressed, mild or moderate severity, unspecified: Secondary | ICD-10-CM | POA: Diagnosis not present

## 2015-10-31 MED ORDER — ZOLPIDEM TARTRATE 10 MG PO TABS: 10.0000 mg | ORAL_TABLET | Freq: Every day | ORAL | Status: DC

## 2015-10-31 MED ORDER — ARIPIPRAZOLE 2 MG PO TABS: 4.0000 mg | ORAL_TABLET | Freq: Every day | ORAL | Status: DC

## 2015-10-31 MED ORDER — BUPROPION HCL ER (XL) 150 MG PO TB24: 150.0000 mg | ORAL_TABLET | Freq: Every day | ORAL | Status: DC

## 2015-10-31 NOTE — Progress Notes (Signed)
Psychiatric Initial Adult Assessment   Patient Identification: Jeremiah [name] MRN:  [id number] Date of Evaluation:  10/31/2015 Referral Source: Dr. Bridgett Larsson Chief Complaint:   Chief Complaint    Establish Care; Depression; Stress     Visit Diagnosis:    ICD-9-CM ICD-10-CM   1. Bipolar I disorder, most recent episode depressed ([place]) 296.50 F31.30   2. Alcohol dependence   303.93 F10.21    Diagnosis:   Patient Active Problem List   Diagnosis Date Noted  . Pedal edema [R60.0] 03/08/2015  . History of repair of rotator cuff [Z98.890] 02/17/2014  . Diabetes ([place]) [E11.9] 08/31/2013  . BP (high blood pressure) [I10] 08/31/2013  . Adult hypothyroidism [E03.9] 08/31/2013  . Obstructive apnea [G47.33] 08/31/2013  . Idiopathic localized osteoarthropathy [M19.91] 05/31/2013  . ABDOMINAL PAIN, LEFT UPPER QUADRANT [R10.12] 10/04/2009  . TARSAL TUNNEL SYNDROME, RIGHT [G57.50] 09/25/2009  . ANKLE PAIN, RIGHT [M25.579] 09/25/2009  . OTHER ANKLE SPRAIN AND STRAIN [id number], [id number] 09/25/2009  . BREAST MASS, RIGHT [N63] 01/07/2008  . INSOMNIA [G47.00] 01/07/2008  . GASTRIC ULCER, ACUTE [K25.3] 11/09/2007  . DUODENITIS [K29.80] 11/09/2007  . BLOOD IN STOOL, OCCULT [R19.5] 11/09/2007  . PERSONAL HISTORY OF COLONIC POLYPS [Z86.010] 11/09/2007  . BACK PAIN, LUMBAR [M54.5] 10/12/2007  . HELICOBACTER PYLORI GASTRITIS [A04.8] 09/09/2007  . GASTRIC ULCER, ACUTE, HEMORRHAGE [K25.0] 09/09/2007  . HIATAL HERNIA [K44.9] 09/09/2007  . COLONIC POLYPS, ADENOMATOUS [D12.6] 08/19/2007  . INTERNAL HEMORRHOIDS [K64.8] 08/19/2007  . EXTERNAL HEMORRHOIDS WITHOUT MENTION COMP [K64.4] 08/07/2007  . Blood in stool [K92.1] 08/07/2007  . ABDOMINAL PAIN, LEFT LOWER QUADRANT [R10.32] 08/07/2007  . SINUSITIS, ACUTE [J01.90] 06/12/2007  . ABDOMINAL PAIN [R10.9] 06/12/2007  . DIABETES MELLITUS, TYPE II [E11.9] 03/19/2007  . GOUT [M10.9] 03/19/2007  . DISORDER, CIRCADIAN RHYTHM SLEEP, NONORGANIC [G47.20, F51.8]  03/19/2007  . Depression [F32.9] 03/19/2007  . OSTEOARTHROSIS, GENERALIZED, MULTIPLE SITES [M15.9] 03/19/2007   History of Present Illness:   Patient is a 55 year old married male who presented for initial assessment. He was referred from Dr. Vallarie Mare who has recently closed his practice. Patient reported that he has long history of bipolar disorder and was diagnosed by Dr. Bridgett Larsson approximately 4 years ago. He reported that he is currently feeling depressed and has worsening of his depressive symptoms couple of months ago. He reported that he is currently taking a combination of lithium and Abilify and Wellbutrin. Abilify was recently started by Dr. Bridgett Larsson as he was having racing thoughts and delusional thinking about his wife. He reported that his wife works from home and he was having thoughts that his wife might be having an affair. Patient reported that his thoughts settle down and he is not having delusions thinking about his wife. Patient reported that he sleeps well with the help of Ambien at night. He reported that he takes lithium 600 mg twice a day. His lithium level was done by his primary care physician in October. It came back within normal limits. He is not experiencing any abnormal side effects of the medications. Patient stated that he does not have any adverse effects of the medication at this time. He is compliant with his medications. He reported that he does not have any suicidal ideations or plans. He was having suicidal thoughts in December when he was thinking about driving his car off the road. However he has never attempted suicide in the past. He does not have any history of previous psychiatric hospitalization. He reported that he is being monitored closely by his wife.  He has good relationship with her. Patient stated that he is open to suggestions and is willing to have his medications adjusted at this time. Elements:  Severity:  moderate. Associated Signs/Symptoms: Depression  Symptoms:  depressed mood, insomnia, psychomotor agitation, fatigue, feelings of worthlessness/guilt, difficulty concentrating, hopelessness, anxiety, loss of energy/fatigue, increased appetite, (Hypo) Manic Symptoms:  Distractibility, Flight of Ideas, Irritable Mood, Labiality of Mood, Anxiety Symptoms:  anxiety Psychotic Symptoms:  none PTSD Symptoms: Negative NA  Past Medical History:  Past Medical History  Diagnosis Date  . Hypertension   . Sleep apnea     dx 1.5 yr ago...study @ [place]  . Arthritis     bil knees  . Hypothyroidism   . Anxiety   . Depression     dx 2004  . H/O hiatal hernia     had surgery for that 2012  [facility]  . Diabetes mellitus without complication ([place])     type ll  controlled by weight & diet  . Colon polyp Dec 2015    Dr Allen Norris  . Sleep apnea     uses c pap  . Bipolar disorder [facility])     Past Surgical History  Procedure Laterality Date  . Rotator cuff repair      x 3  on right shoulder  . Shoulder surgery      x 3 on left  . Mandible surgery      1983  . Foot surgery      right foot 2012  . Appendectomy    . Shoulder arthroscopy with rotator cuff repair and subacromial decompression Left 03/04/2013    Procedure: LEFT SHOULDER ARTHROSCOPY WITH ROTATOR CUFF REPAIR AND SUBACROMIAL DECOMPRESSION;  Surgeon: Marin Shutter, MD;  Location: [place];  Service: Orthopedics;  Laterality: Left;  . Colonoscopy  Dec 2015    Dr Allen Norris  . Hernia repair      Left inguinal  . Hernia repair      [facility]atal hernia at [facility]  . Excisional hemorrhoidectomy  01/02/15  . Replacement total knee bilateral      patient had knee replacement to his right side in 2015 and right 2014   Family History:  Family History  Problem Relation Age of Onset  . CAD Mother   . Arthritis Mother   . Angina Mother   . Diabetes Father   . Heart disease Father   . Colon polyps Father   . Heart attack Father   . Bipolar disorder Sister   . Asthma Sister   . Schizophrenia  Sister   . Diabetes Maternal Grandmother   . Cancer Maternal Grandmother   . Heart attack Maternal Grandfather   . Stomach cancer Paternal Grandmother    Social History:   Social History   Social History  . Marital Status: Married    Spouse Name: N/A  . Number of Children: N/A  . Years of Education: N/A   Social History Main Topics  . Smoking status: Never Smoker   . Smokeless tobacco: Former Systems developer    Types: [place] date: 10/07/1989  . Alcohol Use: 8.4 oz/week    0 Standard drinks or equivalent, 14 Cans of beer, 0 Shots of liquor, 0 Glasses of wine per week     Comment: 2 beers per day  . Drug Use: No  . Sexual Activity: Yes   Other Topics Concern  . None   Social History Narrative   Additional Social History:  Married x  4 years. This is his second marrigae. Works at [employer] in [employer] division.  Both have children from the first marriages. Patient drinks alcohol on a regular basis. He reported that this is lite alcohol and he does not feel that it is affecting his medication.  Musculoskeletal: Strength & Muscle Tone: within normal limits Gait & Station: normal Patient leans: N/A  Psychiatric Specialty Exam: HPI  ROS  Blood pressure 122/88, pulse 70, temperature 97.4 F (36.3 C), temperature source Tympanic, height 5\' 10"  (1.778 m), weight 213 lb 3.2 oz (96.707 kg), SpO2 95 %.Body mass index is 30.59 kg/(m^2).  General Appearance: Casual  Eye Contact:  Fair  Speech:  Clear and Coherent and Normal Rate  Volume:  Normal  Mood:  Anxious  Affect:  Congruent  Thought Process:  Coherent and Goal Directed  Orientation:  Full (Time, Place, and Person)  Thought Content:  WDL  Suicidal Thoughts:  No  Homicidal Thoughts:  No  Memory:  Immediate;   Fair  Judgement:  Fair  Insight:  Fair  Psychomotor Activity:  Normal  Concentration:  Fair  Recall:  [employer] [place]  Language: Fair  Akathisia:  No  Handed:  Right  AIMS (if indicated):    Assets:   Communication Skills Desire for Improvement Housing Intimacy Physical Health Social Support  ADL's:  Intact  Cognition: WNL  Sleep:  7-8   Is the patient at risk to self?  No. Has the patient been a risk to self in the past 6 months?  No. Has the patient been a risk to self within the distant past?  No. Is the patient a risk to others?  No. Has the patient been a risk to others in the past 6 months?  No. Has the patient been a risk to others within the distant past?  No.  Allergies:   Allergies  Allergen Reactions  . Tequin [Gatifloxacin] Anaphylaxis and Rash  . Amoxicillin Hives   Current Medications: Current Outpatient Prescriptions  Medication Sig Dispense Refill  . amLODipine (NORVASC) 10 MG tablet Take 5 mg by mouth daily.    Renae Gloss 4 MG/24HR PT24 patch     . ARIPiprazole (ABILIFY) 2 MG tablet     . buPROPion (WELLBUTRIN XL) 300 MG 24 hr tablet Take 1 tablet (300 mg total) by mouth daily. 30 tablet 2  . hydrochlorothiazide (HYDRODIURIL) 25 MG tablet     . HYDROcodone-acetaminophen (NORCO/VICODIN) 5-325 MG per tablet Take 1 tablet by mouth every 6 (six) hours as needed for moderate pain. 28 tablet 0  . lithium carbonate 300 MG capsule Take 600 mg by mouth 2 (two) times daily with a meal.    . loratadine (CLARITIN) 10 MG tablet Take 10 mg by mouth daily.    . meclizine (ANTIVERT) 25 MG tablet Take 1 tablet (25 mg total) by mouth 3 (three) times daily as needed for dizziness. 30 tablet 0  . meloxicam (MOBIC) 15 MG tablet     . Multiple Vitamin (MULTIVITAMIN WITH MINERALS) TABS Take 1 tablet by mouth daily.    [name] NUVIGIL 150 MG tablet TK 1 T PO QAM  2  . sildenafil (VIAGRA) 100 MG tablet Take 100 mg by mouth as needed for erectile dysfunction. Reported on 10/31/2015    . Testosterone (ANDROGEL) 40.5 MG/2.5GM (1.62%) GEL Apply 2 pumps daily 2.5 g 5  . traMADol (ULTRAM) 50 MG tablet Take 50 mg by mouth every 6 (six) hours as needed.    [name]  zolpidem (AMBIEN) 10 MG tablet       No current facility-administered medications for this visit.    Previous Psychotropic Medications: Lithium Wellbutrin Abilify prozac zoloft celexa Depakote seroquel- could not function risperdal pristiq Lamotrigine Latuda belsorma Trazodone   Substance Abuse History in the last 12 months:  Yes.    Consequences of Substance Abuse: Negative NA  Medical Decision Making:  Review of Psycho-Social Stressors (1) and Review and summation of old records (2)  Treatment Plan Summary: Medication management   Discussed with patient at length about his medications treatment risks benefits and alternatives. Will continue on lithium carbonate 600 mg by mouth twice a day. He has enough supply of the medications.Last Nicoletta Dress level 0.6 done in Oct 2016.  I will order lithium level at this time. He will get it done in the next couple of days. I will also order other labs including CBC with differential CMP and TSH. I will decrease the dose of Wellbutrin XL 150 mg in the morning. I will titrate Abilify 5 mg by mouth daily to control his mood stabilization He will continue on Ambien 10 mg at bedtime Patient will follow up in 2 months or earlier depending on his symptoms   More than 50% of the time spent in psychoeducation, counseling and coordination of care.    This note was generated in part or whole with voice recognition software. Voice regonition is usually quite accurate but there are transcription errors that can and very often do occur. I apologize for any typographical errors that were not detected and corrected.    Rainey Pines, MD    1/24/20179:58 AM

## 2015-11-02 ENCOUNTER — Other Ambulatory Visit: Payer: Self-pay | Admitting: Family Medicine

## 2015-11-10 ENCOUNTER — Telehealth: Payer: Self-pay

## 2015-11-10 NOTE — Telephone Encounter (Signed)
received a request for refill on Nuvigil 150mg  .  pt last seen on  10-31-15 next appt 12-28-15

## 2015-11-13 ENCOUNTER — Other Ambulatory Visit: Payer: Self-pay

## 2015-11-13 NOTE — Telephone Encounter (Signed)
received a fax requesting nuvigil  150mg  tablets, take 1 tablet by mouth every morning. pt last seen on 10-31-15 next appt 12-28-15

## 2015-11-14 ENCOUNTER — Other Ambulatory Visit: Payer: Self-pay | Admitting: Family Medicine

## 2015-11-17 ENCOUNTER — Ambulatory Visit (INDEPENDENT_AMBULATORY_CARE_PROVIDER_SITE_OTHER): Payer: 59 | Admitting: Family Medicine

## 2015-11-17 ENCOUNTER — Encounter: Payer: Self-pay | Admitting: Family Medicine

## 2015-11-17 VITALS — BP 116/80 | HR 72 | Temp 97.6°F | Resp 16 | Wt 214.4 lb

## 2015-11-17 DIAGNOSIS — B349 Viral infection, unspecified: Secondary | ICD-10-CM

## 2015-11-17 MED ORDER — HYDROCODONE-HOMATROPINE 5-1.5 MG/5ML PO SYRP: ORAL_SOLUTION | ORAL | Status: DC

## 2015-11-17 NOTE — Patient Instructions (Signed)
Discussed use of Mucinex D for congestion and Claritin for drippy nose. May use Delsym for cough if hydrocodone makes you too drowsy.

## 2015-11-17 NOTE — Progress Notes (Addendum)
Subjective:     Patient ID: Jeremiah [name], male   DOB: May 09, 1961, 55 y.o.   MRN: [id number]  HPI  Chief Complaint  Patient presents with  . Cough    Patient comes in office today with complaints of cough and conestion for the psat 24hrs, patient states that he has had sinus pain and pressure below and above eyes. This morning patient reports that he has been experiencing diarrhea, he has been taking otc Claritin for relief  Also reports sneezing and chills but no body aches/ +flu shot. Reports some co-workers are ill as well.   Review of Systems  Constitutional: Negative for fever.       Objective:   Physical Exam  Constitutional: He appears well-developed and well-nourished. No distress.  Ears: T.M's intact without inflammation Throat: no tonsillar enlargement or exudate Neck: no cervical adenopathy Lungs: clear     Assessment:    1. Viral syndrome - HYDROcodone-homatropine (HYCODAN) 5-1.5 MG/5ML syrup; 5 ml 4-6 hours as needed for cough  Dispense: 240 mL; Refill: 0    Plan:    Discussed use of Clartin, Mucinex D and Delsym.

## 2015-11-20 ENCOUNTER — Other Ambulatory Visit: Payer: Self-pay

## 2015-11-20 NOTE — Telephone Encounter (Signed)
received a fax requesting nuvigil 150mg  take 1 tablet by mouth every morning.  pt last seen on  10-31-15 next appt 12-28-15

## 2015-11-23 NOTE — Telephone Encounter (Signed)
Pt not prescribed Nuvigil at this practice.

## 2015-11-28 ENCOUNTER — Other Ambulatory Visit: Payer: Self-pay

## 2015-11-28 NOTE — Telephone Encounter (Signed)
pt states that at his last visit you and him talked about refilling this medication when he uses what he already had. pt stated that you told him to call you back when he needed a refill.

## 2015-11-28 NOTE — Telephone Encounter (Signed)
pt called wanted to get refill on nuvigil.  pt last seen on  10-31-15 next appt 12-28-15.

## 2015-11-30 NOTE — Telephone Encounter (Signed)
Med not prescribed

## 2015-12-05 ENCOUNTER — Ambulatory Visit (INDEPENDENT_AMBULATORY_CARE_PROVIDER_SITE_OTHER): Payer: 59 | Admitting: Psychiatry

## 2015-12-05 ENCOUNTER — Encounter: Payer: Self-pay | Admitting: Psychiatry

## 2015-12-05 VITALS — BP 124/82 | HR 59 | Temp 97.6°F | Ht 70.0 in | Wt 211.6 lb

## 2015-12-05 DIAGNOSIS — F3110 Bipolar disorder, current episode manic without psychotic features, unspecified: Secondary | ICD-10-CM | POA: Diagnosis not present

## 2015-12-05 DIAGNOSIS — F1020 Alcohol dependence, uncomplicated: Secondary | ICD-10-CM

## 2015-12-05 MED ORDER — LITHIUM CARBONATE 300 MG PO CAPS: 600.0000 mg | ORAL_CAPSULE | Freq: Two times a day (BID) | ORAL | Status: DC

## 2015-12-05 MED ORDER — ZOLPIDEM TARTRATE 10 MG PO TABS: 10.0000 mg | ORAL_TABLET | Freq: Every day | ORAL | Status: DC

## 2015-12-05 MED ORDER — ARIPIPRAZOLE 10 MG PO TABS: 10.0000 mg | ORAL_TABLET | Freq: Every day | ORAL | Status: DC

## 2015-12-05 MED ORDER — BUPROPION HCL ER (XL) 150 MG PO TB24: 150.0000 mg | ORAL_TABLET | Freq: Every day | ORAL | Status: DC

## 2015-12-05 NOTE — Progress Notes (Signed)
Psychiatric MD Progress Note   Patient Identification: Jeremiah [name] MRN:  [id number] Date of Evaluation:  12/05/2015 Referral Source: Dr. Bridgett Larsson Chief Complaint:   Chief Complaint    Follow-up; Medication Refill; Manic Behavior; Fatigue; Other; Insomnia     Visit Diagnosis:    ICD-9-CM ICD-10-CM   1. Bipolar I disorder, most recent episode depressed ([place]) 296.50 F31.30   2. Alcohol dependence   303.93 F10.21    Diagnosis:   Patient Active Problem List   Diagnosis Date Noted  . Pedal edema [R60.0] 03/08/2015  . History of repair of rotator cuff [Z98.890] 02/17/2014  . Diabetes ([place]) [E11.9] 08/31/2013  . BP (high blood pressure) [I10] 08/31/2013  . Adult hypothyroidism [E03.9] 08/31/2013  . Obstructive apnea [G47.33] 08/31/2013  . Clinical depression [F32.9] 08/31/2013  . Diabetes mellitus ([place]) [E11.9] 08/31/2013  . Idiopathic localized osteoarthropathy [M19.91] 05/31/2013  . ABDOMINAL PAIN, LEFT UPPER QUADRANT [R10.12] 10/04/2009  . TARSAL TUNNEL SYNDROME, RIGHT [G57.50] 09/25/2009  . ANKLE PAIN, RIGHT [M25.579] 09/25/2009  . OTHER ANKLE SPRAIN AND STRAIN [id number], [id number] 09/25/2009  . BREAST MASS, RIGHT [N63] 01/07/2008  . INSOMNIA [G47.00] 01/07/2008  . GASTRIC ULCER, ACUTE [K25.3] 11/09/2007  . DUODENITIS [K29.80] 11/09/2007  . BLOOD IN STOOL, OCCULT [R19.5] 11/09/2007  . PERSONAL HISTORY OF COLONIC POLYPS [Z86.010] 11/09/2007  . BACK PAIN, LUMBAR [M54.5] 10/12/2007  . HELICOBACTER PYLORI GASTRITIS [A04.8] 09/09/2007  . GASTRIC ULCER, ACUTE, HEMORRHAGE [K25.0] 09/09/2007  . HIATAL HERNIA [K44.9] 09/09/2007  . COLONIC POLYPS, ADENOMATOUS [D12.6] 08/19/2007  . INTERNAL HEMORRHOIDS [K64.8] 08/19/2007  . EXTERNAL HEMORRHOIDS WITHOUT MENTION COMP [K64.4] 08/07/2007  . Blood in stool [K92.1] 08/07/2007  . ABDOMINAL PAIN, LEFT LOWER QUADRANT [R10.32] 08/07/2007  . SINUSITIS, ACUTE [J01.90] 06/12/2007  . ABDOMINAL PAIN [R10.9] 06/12/2007  . DIABETES MELLITUS, TYPE II  [E11.9] 03/19/2007  . GOUT [M10.9] 03/19/2007  . DISORDER, CIRCADIAN RHYTHM SLEEP, NONORGANIC [G47.20, F51.8] 03/19/2007  . Depression [F32.9] 03/19/2007  . OSTEOARTHROSIS, GENERALIZED, MULTIPLE SITES [M15.9] 03/19/2007   History of Present Illness:   Patient is a 55 year old married male who presented for a low. He reported that he was unable to sleep last night as his.-year-old grandbaby was sick. Patient reported that he has been feeling tired this morning. He also mentioned that he was having flight of ideas distractibility and increased energy for the past 2 weeks. He reported that he might be having a manic episode. He reported that he was very hyperactive and doing several projects at the same time. He was getting up around 5 AM in the morning. He has less sleep. He reported that since this weekend he has crashed out and has been feeling depressed. He reported that his wife was also very surprised at what has happened to him. Patient reported that he has been taking lithium and Abilify as prescribed. Several days the Abilify was helping him and other days it was making him very tired. Patient reported that he is interested in having his medications adjusted. He currently denied having any suicidal homicidal ideations or plans. He appeared calm and cooperative during the interview..      Past Medical History:  Past Medical History  Diagnosis Date  . Hypertension   . Sleep apnea     dx 1.5 yr ago...study @ [place]  . Arthritis     bil knees  . Hypothyroidism   . Anxiety   . Depression     dx 2004  . H/O hiatal hernia     had surgery  for that 2012  [facility]  . Diabetes mellitus without complication ([place])     type ll  controlled by weight & diet  . Colon polyp Dec 2015    Dr Allen Norris  . Sleep apnea     uses c pap  . Bipolar disorder [facility])     Past Surgical History  Procedure Laterality Date  . Rotator cuff repair      x 3  on right shoulder  . Shoulder surgery      x 3 on  left  . Mandible surgery      1983  . Foot surgery      right foot 2012  . Appendectomy    . Shoulder arthroscopy with rotator cuff repair and subacromial decompression Left 03/04/2013    Procedure: LEFT SHOULDER ARTHROSCOPY WITH ROTATOR CUFF REPAIR AND SUBACROMIAL DECOMPRESSION;  Surgeon: Marin Shutter, MD;  Location: [place];  Service: Orthopedics;  Laterality: Left;  . Colonoscopy  Dec 2015    Dr Allen Norris  . Hernia repair      Left inguinal  . Hernia repair      hiatal hernia at [facility]  . Excisional hemorrhoidectomy  01/02/15  . Replacement total knee bilateral      patient had knee replacement to his right side in 2015 and right 2014   Family History:  Family History  Problem Relation Age of Onset  . CAD Mother   . Arthritis Mother   . Angina Mother   . Diabetes Father   . Heart disease Father   . Colon polyps Father   . Heart attack Father   . Bipolar disorder Sister   . Asthma Sister   . Schizophrenia Sister   . Diabetes Maternal Grandmother   . Cancer Maternal Grandmother   . Heart attack Maternal Grandfather   . Stomach cancer Paternal Grandmother    Social History:   Social History   Social History  . Marital Status: Married    Spouse Name: N/A  . Number of Children: N/A  . Years of Education: N/A   Social History Main Topics  . Smoking status: Never Smoker   . Smokeless tobacco: Former Systems developer    Types: [place] date: 10/07/1989  . Alcohol Use: 4.8 - 8.4 oz/week    0 Glasses of wine, 0 Shots of liquor, 0 Standard drinks or equivalent, 8-14 Cans of beer per week     Comment: 2 beers per day  . Drug Use: No  . Sexual Activity: Yes   Other Topics Concern  . None   Social History Narrative   Additional Social History:  Married x 4 years. This is his second marrigae. Works at [employer] in [employer] division.  Both have children from the first marriages. Patient drinks alcohol on a regular basis. He reported that this is lite alcohol and he does not feel that it is  affecting his medication.  Musculoskeletal: Strength & Muscle Tone: within normal limits Gait & Station: normal Patient leans: N/A  Psychiatric Specialty Exam: Insomnia    Review of Systems  Psychiatric/Behavioral: The patient has insomnia.     Blood pressure 124/82, pulse 59, temperature 97.6 F (36.4 C), temperature source Tympanic, height 5\' 10"  (1.778 m), weight 211 lb 9.6 oz (95.981 kg), SpO2 96 %.Body mass index is 30.36 kg/(m^2).  General Appearance: Casual  Eye Contact:  Fair  Speech:  Clear and Coherent and Normal Rate  Volume:  Normal  Mood:  Anxious  Affect:  Congruent  Thought Process:  Coherent and Goal Directed  Orientation:  Full (Time, Place, and Person)  Thought Content:  WDL  Suicidal Thoughts:  No  Homicidal Thoughts:  No  Memory:  Immediate;   Fair  Judgement:  Fair  Insight:  Fair  Psychomotor Activity:  Normal  Concentration:  Fair  Recall:  [employer] [place]  Language: Fair  Akathisia:  No  Handed:  Right  AIMS (if indicated):    Assets:  Communication Skills Desire for Improvement Housing Intimacy Physical Health Social Support  ADL's:  Intact  Cognition: WNL  Sleep:  7-8   Is the patient at risk to self?  No. Has the patient been a risk to self in the past 6 months?  No. Has the patient been a risk to self within the distant past?  No. Is the patient a risk to others?  No. Has the patient been a risk to others in the past 6 months?  No. Has the patient been a risk to others within the distant past?  No.  Allergies:   Allergies  Allergen Reactions  . Tequin [Gatifloxacin] Anaphylaxis and Rash  . Amoxicillin Hives   Current Medications: Current Outpatient Prescriptions  Medication Sig Dispense Refill  . amLODipine (NORVASC) 10 MG tablet Take 5 mg by mouth daily.    . ARIPiprazole (ABILIFY) 2 MG tablet Take 2 tablets (4 mg total) by mouth daily. Pt has supply    . buPROPion (WELLBUTRIN XL) 150 MG 24 hr tablet Take 150  mg by mouth daily.  2  . EPIPEN 2-PAK 0.3 MG/0.3ML SOAJ injection     . lithium carbonate 300 MG capsule Take 600 mg by mouth 2 (two) times daily with a meal.    . loratadine (CLARITIN) 10 MG tablet Take 10 mg by mouth daily.    . meclizine (ANTIVERT) 25 MG tablet Take 1 tablet (25 mg total) by mouth 3 (three) times daily as needed for dizziness. 30 tablet 0  . meloxicam (MOBIC) 15 MG tablet     . mometasone (NASONEX) 50 MCG/ACT nasal spray SHAKE LQ AND U 1 TO 2 SPRAYS IEN QD  6  . Multiple Vitamin (MULTIVITAMIN WITH MINERALS) TABS Take 1 tablet by mouth daily.    [name] NUVIGIL 150 MG tablet TK 1 T PO QAM  2  . sildenafil (VIAGRA) 100 MG tablet Take 100 mg by mouth as needed for erectile dysfunction. Reported on 10/31/2015    . Testosterone (ANDROGEL) 40.5 MG/2.5GM (1.62%) GEL Apply 2 pumps daily 2.5 g 5  . traMADol (ULTRAM) 50 MG tablet Take 50 mg by mouth every 6 (six) hours as needed.    . zolpidem (AMBIEN) 10 MG tablet Take 1 tablet (10 mg total) by mouth at bedtime. 30 tablet 2   No current facility-administered medications for this visit.    Previous Psychotropic Medications: Lithium Wellbutrin Abilify prozac zoloft celexa Depakote seroquel- could not function risperdal pristiq Lamotrigine Latuda belsorma Trazodone   Substance Abuse History in the last 12 months:  Yes.    Consequences of Substance Abuse: Negative NA  Medical Decision Making:  Review of Psycho-Social Stressors (1) and Review and summation of old records (2)  Treatment Plan Summary: Medication management   Discussed with patient at length about his medications treatment risks benefits and alternatives. Will continue on lithium carbonate 600 mg by mouth twice a day.   Continue  Wellbutrin XL 150 mg in the morning. I will titrate Abilify  10 mg by mouth daily to control his mood stabilization He will continue on Ambien 10 mg at bedtime Patient will follow up in 1 months or earlier depending on his  symptoms   More than 50% of the time spent in psychoeducation, counseling and coordination of care.   Time spent with the patient 30 minutes  This note was generated in part or whole with voice recognition software. Voice regonition is usually quite accurate but there are transcription errors that can and very often do occur. I apologize for any typographical errors that were not detected and corrected.    Rainey Pines, MD    2/28/20178:51 AM

## 2015-12-18 ENCOUNTER — Ambulatory Visit (INDEPENDENT_AMBULATORY_CARE_PROVIDER_SITE_OTHER): Payer: 59 | Admitting: Family Medicine

## 2015-12-18 VITALS — BP 152/84 | HR 76 | Temp 97.9°F | Resp 14 | Wt 214.0 lb

## 2015-12-18 DIAGNOSIS — F319 Bipolar disorder, unspecified: Secondary | ICD-10-CM

## 2015-12-18 DIAGNOSIS — F3130 Bipolar disorder, current episode depressed, mild or moderate severity, unspecified: Secondary | ICD-10-CM | POA: Diagnosis not present

## 2015-12-18 NOTE — Progress Notes (Signed)
Patient ID: Jeremiah [name], male   DOB: [birth year], 55 y.o.   MRN: [id number]    Subjective:  HPI  Patient is here to discuss FMLA forms.  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 Dr Gretel Acre adjusted Abilify to 10 mg 2 weeks ago now, this dose is making patient very groggy and makes it difficult to work and concentrate. He feels like the dose is helping his depression/bipolar disorder. Nurse at patient's work advised patient that he needs to go on FMLA until medication gets in his system. Patient called the psychiatrist but they wont do FMLA unless patient has been seen there for at least 6 months. Patient discussed the situation with the doctor and doctor just said well decrease the dose again then but patient states his symptoms were worse on the lower dose and she did not suggest any other options.  Prior to Admission medications   Medication Sig Start Date End Date Taking? Authorizing Provider  amLODipine (NORVASC) 10 MG tablet Take 5 mg by mouth daily.   Yes Historical Provider, MD  ARIPiprazole (ABILIFY) 10 MG tablet Take 1 tablet (10 mg total) by mouth daily. Pt has supply 12/05/15  Yes Rainey Pines, MD  buPROPion (WELLBUTRIN XL) 150 MG 24 hr tablet Take 1 tablet (150 mg total) by mouth daily. Samples given 12/05/15  Yes Rainey Pines, MD  EPIPEN 2-PAK 0.3 MG/0.3ML SOAJ injection  11/07/15  Yes Historical Provider, MD  lithium carbonate 300 MG capsule Take 2 capsules (600 mg total) by mouth 2 (two) times daily with a meal. 12/05/15  Yes Rainey Pines, MD  loratadine (CLARITIN) 10 MG tablet Take 10 mg by mouth daily.   Yes Historical Provider, MD  meclizine (ANTIVERT) 25 MG tablet Take 1 tablet (25 mg total) by mouth 3 (three) times daily as needed for dizziness. 09/12/15  Yes Richard Maceo Pro., MD  meloxicam [facility]) 15 MG tablet  08/02/15  Yes Historical Provider, MD  mometasone (NASONEX) 50 MCG/ACT nasal spray SHAKE LQ AND U 1 TO 2  SPRAYS IEN QD 11/07/15  Yes Historical Provider, MD  Multiple Vitamin (MULTIVITAMIN WITH MINERALS) TABS Take 1 tablet by mouth daily.   Yes Historical Provider, MD  sildenafil (VIAGRA) 100 MG tablet Take 100 mg by mouth as needed for erectile dysfunction. Reported on 10/31/2015   Yes Historical Provider, MD  Testosterone (ANDROGEL) 40.5 MG/2.5GM (1.62%) GEL Apply 2 pumps daily 10/20/15  Yes Richard Maceo Pro., MD  traMADol (ULTRAM) 50 MG tablet Take 50 mg by mouth every 6 (six) hours as needed.   Yes Historical Provider, MD  zolpidem (AMBIEN) 10 MG tablet Take 1 tablet (10 mg total) by mouth at bedtime. 12/05/15  Yes Rainey Pines, MD  HYDROcodone-acetaminophen (NORCO/VICODIN) 5-325 MG tablet Reported on 12/18/2015 11/13/15   Historical Provider, MD    Patient Active Problem List   Diagnosis Date Noted  . Pedal edema 03/08/2015  . History of repair of rotator cuff 02/17/2014  . Diabetes ([place]) 08/31/2013  . BP (high blood pressure) 08/31/2013  . Adult hypothyroidism 08/31/2013  . Obstructive apnea 08/31/2013  . Clinical depression 08/31/2013  . Diabetes mellitus ([place]) 08/31/2013  . Idiopathic localized osteoarthropathy 05/31/2013  . ABDOMINAL PAIN, LEFT UPPER QUADRANT 10/04/2009  . TARSAL TUNNEL SYNDROME, RIGHT 09/25/2009  . ANKLE PAIN, RIGHT 09/25/2009  . OTHER ANKLE SPRAIN AND STRAIN 09/25/2009  . BREAST MASS, RIGHT 01/07/2008  . INSOMNIA 01/07/2008  . GASTRIC ULCER,  ACUTE 11/09/2007  . DUODENITIS 11/09/2007  . BLOOD IN STOOL, OCCULT 11/09/2007  . PERSONAL HISTORY OF COLONIC POLYPS 11/09/2007  . BACK PAIN, LUMBAR 10/12/2007  . HELICOBACTER PYLORI GASTRITIS 09/09/2007  . GASTRIC ULCER, ACUTE, HEMORRHAGE 09/09/2007  . HIATAL HERNIA 09/09/2007  . COLONIC POLYPS, ADENOMATOUS 08/19/2007  . INTERNAL HEMORRHOIDS 08/19/2007  . EXTERNAL HEMORRHOIDS WITHOUT MENTION COMP 08/07/2007  . Blood in stool 08/07/2007  . ABDOMINAL PAIN, LEFT LOWER QUADRANT 08/07/2007  . SINUSITIS, ACUTE 06/12/2007  .  ABDOMINAL PAIN 06/12/2007  . DIABETES MELLITUS, TYPE II 03/19/2007  . GOUT 03/19/2007  . DISORDER, CIRCADIAN RHYTHM SLEEP, NONORGANIC 03/19/2007  . Depression 03/19/2007  . OSTEOARTHROSIS, GENERALIZED, MULTIPLE SITES 03/19/2007    Past Medical History  Diagnosis Date  . Hypertension   . Sleep apnea     dx 1.5 yr ago...study @ [place]  . Arthritis     bil knees  . Hypothyroidism   . Anxiety   . Depression     dx 2004  . H/O hiatal hernia     had surgery for that 2012  [facility]  . Diabetes mellitus without complication ([place])     type ll  controlled by weight & diet  . Colon polyp Dec 2015    Dr Allen Norris  . Sleep apnea     uses c pap  . Bipolar disorder [facility])     Social History   Social History  . Marital Status: Married    Spouse Name: N/A  . Number of Children: N/A  . Years of Education: N/A   Occupational History  . Not on file.   Social History Main Topics  . Smoking status: Never Smoker   . Smokeless tobacco: Former Systems developer    Types: [place] date: 10/07/1989  . Alcohol Use: 4.8 - 8.4 oz/week    0 Glasses of wine, 0 Shots of liquor, 0 Standard drinks or equivalent, 8-14 Cans of beer per week     Comment: 2 beers per day  . Drug Use: No  . Sexual Activity: Yes   Other Topics Concern  . Not on file   Social History Narrative    Allergies  Allergen Reactions  . Tequin [Gatifloxacin] Anaphylaxis and Rash  . Amoxicillin Hives    Review of Systems  Constitutional: Negative.   Respiratory: Negative.   Cardiovascular: Negative.   Musculoskeletal: Negative.   Psychiatric/Behavioral: Positive for depression. Negative for suicidal ideas (not since been on abilify 10 mg).    Immunization History  Administered Date(s) Administered  . Td 02/22/2003  . Tdap 07/02/2011   Objective:  BP 152/84 mmHg  Pulse 76  Temp(Src) 97.9 F (36.6 C)  Resp 14  Wt 214 lb (97.07 kg)  Physical Exam  Constitutional: He is oriented to person, place, and time and  well-developed, well-nourished, and in no distress.  Cardiovascular: Normal rate, regular rhythm, normal heart sounds and intact distal pulses.   No murmur heard. Pulmonary/Chest: Effort normal and breath sounds normal. No respiratory distress. He has no wheezes.  Musculoskeletal: He exhibits no edema or tenderness.  Neurological: He is alert and oriented to person, place, and time.  Psychiatric: Mood, affect and judgment normal.    Lab Results  Component Value Date   WBC 5.6 07/12/2015   HGB 14.5 12/14/2014   HCT 49.7 07/12/2015   PLT 227 07/12/2015   GLUCOSE 137* 07/12/2015   CHOL 135 07/12/2015   TRIG 54 07/12/2015   HDL 62 07/12/2015  LDLCALC 62 07/12/2015   TSH 2.740 10/06/2015   PSA 0.39 03/16/2007   HGBA1C 6.4* 07/12/2015    CMP     Component Value Date/Time   NA 140 07/12/2015 1028   NA 134* 03/02/2013 1007   NA 141 10/27/2011 0010   K 5.4* 07/12/2015 1028   K 5.1 10/27/2011 0010   CL 103 07/12/2015 1028   CL 103 10/27/2011 0010   CO2 24 07/12/2015 1028   CO2 25 10/27/2011 0010   GLUCOSE 137* 07/12/2015 1028   GLUCOSE 78 03/02/2013 1007   GLUCOSE 135* 10/27/2011 0010   BUN 15 07/12/2015 1028   BUN 15 03/02/2013 1007   BUN 19* 10/27/2011 0010   CREATININE 1.20 07/12/2015 1028   CREATININE 0.98 10/27/2011 0010   CALCIUM 10.1 07/12/2015 1028   CALCIUM 8.5 10/27/2011 0010   PROT 7.7 07/12/2015 1028   PROT 7.2 02/12/2013 0958   PROT 7.0 10/27/2011 0010   ALBUMIN 4.9 07/12/2015 1028   ALBUMIN 4.0 02/12/2013 0958   ALBUMIN 3.6 10/27/2011 0010   AST 24 07/12/2015 1028   AST 62* 10/27/2011 0010   ALT 28 07/12/2015 1028   ALT 33 10/27/2011 0010   ALKPHOS 65 07/12/2015 1028   ALKPHOS 31* 10/27/2011 0010   BILITOT 1.1 07/12/2015 1028   BILITOT 0.7 02/12/2013 0958   BILITOT 0.6 10/27/2011 0010   GFRNONAA 67 07/12/2015 1028   GFRNONAA >60 10/27/2011 0010   GFRAA 78 07/12/2015 1028   GFRAA >60 10/27/2011 0010    Assessment and Plan :  1. Bipolar  depression ([place]) Discussed with patient to take Abilify 4 mg 2 tablets daily and see if he will tolerate this dose better, take this for 1 week and then go to 10 mg. This may work better for the patient. Will go ahead and fill out FMLA for patient since he is having "grogginess, sleepiness" on the 10 mg dose and this is affecting his ability to work and can not afford to be out of work most of the week. Will work on the form on Friday and have this ready for patient. Medication changes overall will probably take 2 to 4 weeks to adjust in his system.  I have done the exam and reviewed the above chart and it is accurate to the best of my knowledge.  Miguel Aschoff MD [facility] 12/18/2015 3:40 PM

## 2015-12-22 ENCOUNTER — Encounter: Payer: Self-pay | Admitting: Gastroenterology

## 2015-12-25 ENCOUNTER — Telehealth: Payer: Self-pay | Admitting: Family Medicine

## 2015-12-25 NOTE — Telephone Encounter (Signed)
Is this ok?-aa 

## 2015-12-25 NOTE — Telephone Encounter (Signed)
Pt would like to pick up an work note today if possible allowing pt to return to work tomorrow 12/26/15 with no restrictions. Please advise. Thanks TNP

## 2015-12-25 NOTE — Telephone Encounter (Signed)
ok 

## 2015-12-26 ENCOUNTER — Encounter: Payer: Self-pay | Admitting: Family Medicine

## 2015-12-26 NOTE — Telephone Encounter (Signed)
Note is completed.  cbe

## 2015-12-27 ENCOUNTER — Telehealth: Payer: Self-pay | Admitting: Family Medicine

## 2015-12-27 NOTE — Telephone Encounter (Signed)
Pt stated he have left FMLA forms at his OV on 12/18/15. Pt wanted to know if they were ready for pick up. Please advise. Thanks TNP

## 2015-12-27 NOTE — Telephone Encounter (Signed)
Dr. Darnell Level, have you completed papers? Please advise. Thanks!

## 2015-12-27 NOTE — Telephone Encounter (Signed)
Sorry, behind all paperwork. I will have them done Friday. I will have him back to the office either Friday afternoon or Monday morning

## 2015-12-27 NOTE — Telephone Encounter (Signed)
Advised patient as below.  

## 2015-12-28 ENCOUNTER — Encounter: Payer: Self-pay | Admitting: Psychiatry

## 2015-12-28 ENCOUNTER — Ambulatory Visit (INDEPENDENT_AMBULATORY_CARE_PROVIDER_SITE_OTHER): Payer: 59 | Admitting: Psychiatry

## 2015-12-28 VITALS — BP 140/88 | HR 94 | Temp 97.0°F | Ht 70.0 in | Wt 212.0 lb

## 2015-12-28 DIAGNOSIS — F1020 Alcohol dependence, uncomplicated: Secondary | ICD-10-CM | POA: Diagnosis not present

## 2015-12-28 DIAGNOSIS — F3130 Bipolar disorder, current episode depressed, mild or moderate severity, unspecified: Secondary | ICD-10-CM

## 2015-12-28 MED ORDER — LITHIUM CARBONATE 300 MG PO CAPS: 600.0000 mg | ORAL_CAPSULE | Freq: Two times a day (BID) | ORAL | Status: DC

## 2015-12-28 MED ORDER — BUPROPION HCL ER (XL) 150 MG PO TB24: 150.0000 mg | ORAL_TABLET | Freq: Every day | ORAL | Status: DC

## 2015-12-28 MED ORDER — ARIPIPRAZOLE 10 MG PO TABS: 10.0000 mg | ORAL_TABLET | Freq: Every day | ORAL | Status: DC

## 2015-12-28 NOTE — Progress Notes (Signed)
Psychiatric MD Progress Note   Patient Identification: Jeremiah [name] MRN:  [id number] Date of Evaluation:  12/28/2015 Referral Source: Dr. Bridgett Larsson Chief Complaint:   Chief Complaint    Follow-up; Medication Refill; Anxiety; Panic Attack     Visit Diagnosis:    ICD-9-CM ICD-10-CM   1. Bipolar I disorder, most recent episode depressed ([place]) 296.50 F31.30   2. Alcohol dependence   303.93 F10.21    Diagnosis:   Patient Active Problem List   Diagnosis Date Noted  . Pedal edema [R60.0] 03/08/2015  . History of repair of rotator cuff [Z98.890] 02/17/2014  . Diabetes ([place]) [E11.9] 08/31/2013  . BP (high blood pressure) [I10] 08/31/2013  . Adult hypothyroidism [E03.9] 08/31/2013  . Obstructive apnea [G47.33] 08/31/2013  . Clinical depression [F32.9] 08/31/2013  . Diabetes mellitus ([place]) [E11.9] 08/31/2013  . Idiopathic localized osteoarthropathy [M19.91] 05/31/2013  . ABDOMINAL PAIN, LEFT UPPER QUADRANT [R10.12] 10/04/2009  . TARSAL TUNNEL SYNDROME, RIGHT [G57.50] 09/25/2009  . ANKLE PAIN, RIGHT [M25.579] 09/25/2009  . OTHER ANKLE SPRAIN AND STRAIN [id number] 09/25/2009  . BREAST MASS, RIGHT [N63] 01/07/2008  . INSOMNIA [G47.00] 01/07/2008  . GASTRIC ULCER, ACUTE [K25.3] 11/09/2007  . DUODENITIS [K29.80] 11/09/2007  . BLOOD IN STOOL, OCCULT [R19.5] 11/09/2007  . PERSONAL HISTORY OF COLONIC POLYPS [Z86.010] 11/09/2007  . BACK PAIN, LUMBAR [M54.5] 10/12/2007  . HELICOBACTER PYLORI GASTRITIS [A04.8] 09/09/2007  . GASTRIC ULCER, ACUTE, HEMORRHAGE [K25.0] 09/09/2007  . HIATAL HERNIA [K44.9] 09/09/2007  . COLONIC POLYPS, ADENOMATOUS [D12.6] 08/19/2007  . INTERNAL HEMORRHOIDS [K64.8] 08/19/2007  . EXTERNAL HEMORRHOIDS WITHOUT MENTION COMP [K64.4] 08/07/2007  . Blood in stool [K92.1] 08/07/2007  . ABDOMINAL PAIN, LEFT LOWER QUADRANT [R10.32] 08/07/2007  . SINUSITIS, ACUTE [J01.90] 06/12/2007  . ABDOMINAL PAIN [R10.9] 06/12/2007  . DIABETES MELLITUS, TYPE II [E11.9] 03/19/2007   . GOUT [M10.9] 03/19/2007  . DISORDER, CIRCADIAN RHYTHM SLEEP, NONORGANIC [G47.20, F51.8] 03/19/2007  . Depression [F32.9] 03/19/2007  . OSTEOARTHROSIS, GENERALIZED, MULTIPLE SITES [M15.9] 03/19/2007   History of Present Illness:   Patient is a 55 year old married male who presented for Follow-up. He reported that he is doing better on his current medications and has started working since yesterday. He was taken off from his FMLA by his primary care physician [name] and he reported that he is very excited that he is able to work his regular hours. Patient reported that he will go to work early in the morning and is usually returning by 3 PM he is spending some time with his family members including his granddaughter. Patient reported that he has a swimming pool at home and enjoys time with his family. Patient reported that the current combination of medications helping him and he does not have any worsening of his depressive or anxiety symptoms He currently denied having any adverse effects of the medications. He occasionally has lows and he will rebound on his own. He denied having any perceptual disturbances he denied having any suicidal homicidal ideation or plans Patient appeared calm and collective during the interview .      Past Medical History:  Past Medical History  Diagnosis Date  . Hypertension   . Sleep apnea     dx 1.5 yr ago...study @ [place]  . Arthritis     bil knees  . Hypothyroidism   . Anxiety   . Depression     dx 2004  . H/O hiatal hernia     had surgery for that 2012  [facility]  . Diabetes mellitus  without complication ([place])     type ll  controlled by weight & diet  . Colon polyp Dec 2015    Dr Allen Norris  . Sleep apnea     uses c pap  . Bipolar disorder [facility])     Past Surgical History  Procedure Laterality Date  . Rotator cuff repair      x 3  on right shoulder  . Shoulder surgery      x 3 on left  . Mandible surgery      1983  . Foot surgery       right foot 2012  . Appendectomy    . Shoulder arthroscopy with rotator cuff repair and subacromial decompression Left 03/04/2013    Procedure: LEFT SHOULDER ARTHROSCOPY WITH ROTATOR CUFF REPAIR AND SUBACROMIAL DECOMPRESSION;  Surgeon: Marin Shutter, MD;  Location: [facility];  Service: Orthopedics;  Laterality: Left;  . Colonoscopy  Dec 2015    Dr Allen Norris  . Hernia repair      Left inguinal  . Hernia repair      hiatal hernia at [facility]  . Excisional hemorrhoidectomy  01/02/15  . Replacement total knee bilateral      patient had knee replacement to his right side in 2015 and right 2014   Family History:  Family History  Problem Relation Age of Onset  . CAD Mother   . Arthritis Mother   . Angina Mother   . Diabetes Father   . Heart disease Father   . Colon polyps Father   . Heart attack Father   . Bipolar disorder Sister   . Asthma Sister   . Schizophrenia Sister   . Diabetes Maternal Grandmother   . Cancer Maternal Grandmother   . Heart attack Maternal Grandfather   . Stomach cancer Paternal Grandmother    Social History:   Social History   Social History  . Marital Status: Married    Spouse Name: N/A  . Number of Children: N/A  . Years of Education: N/A   Social History Main Topics  . Smoking status: Never Smoker   . Smokeless tobacco: Former Systems developer    Types: [place] date: 10/07/1989  . Alcohol Use: 3.6 - 4.8 oz/week    0 Glasses of wine, 0 Shots of liquor, 0 Standard drinks or equivalent, 6-8 Cans of beer per week     Comment: 2 beers per day  . Drug Use: No  . Sexual Activity: Yes   Other Topics Concern  . None   Social History Narrative   Additional Social History:  Married x 4 years. This is his second marrigae. Works at [employer] in [employer] division.  Both have children from the first marriages. Patient drinks alcohol on a regular basis. He reported that this is lite alcohol and he does not feel that it is affecting his medication.  Musculoskeletal: Strength &  Muscle Tone: within normal limits Gait & Station: normal Patient leans: N/A  Psychiatric Specialty Exam: Anxiety Symptoms include insomnia.    Insomnia    Review of Systems  Psychiatric/Behavioral: The patient has insomnia.     Blood pressure 140/88, pulse 94, temperature 97 F (36.1 C), temperature source Tympanic, height 5\' 10"  (1.778 m), weight 212 lb (96.163 kg), SpO2 97 %.Body mass index is 30.42 kg/(m^2).  General Appearance: Casual  Eye Contact:  Fair  Speech:  Clear and Coherent and Normal Rate  Volume:  Normal  Mood:  Anxious  Affect:  Congruent  Thought  Process:  Coherent and Goal Directed  Orientation:  Full (Time, Place, and Person)  Thought Content:  WDL  Suicidal Thoughts:  No  Homicidal Thoughts:  No  Memory:  Immediate;   Fair  Judgement:  Fair  Insight:  Fair  Psychomotor Activity:  Normal  Concentration:  Fair  Recall:  [employer] [place]  Language: Fair  Akathisia:  No  Handed:  Right  AIMS (if indicated):    Assets:  Communication Skills Desire for Improvement Housing Intimacy Physical Health Social Support  ADL's:  Intact  Cognition: WNL  Sleep:  7-8   Is the patient at risk to self?  No. Has the patient been a risk to self in the past 6 months?  No. Has the patient been a risk to self within the distant past?  No. Is the patient a risk to others?  No. Has the patient been a risk to others in the past 6 months?  No. Has the patient been a risk to others within the distant past?  No.  Allergies:   Allergies  Allergen Reactions  . Tequin [Gatifloxacin] Anaphylaxis and Rash  . Amoxicillin Hives   Current Medications: Current Outpatient Prescriptions  Medication Sig Dispense Refill  . amLODipine (NORVASC) 10 MG tablet Take 5 mg by mouth daily.    . ARIPiprazole (ABILIFY) 10 MG tablet Take 1 tablet (10 mg total) by mouth daily. Pt has supply 30 tablet 1  . buPROPion (WELLBUTRIN XL) 150 MG 24 hr tablet Take 1 tablet (150 mg  total) by mouth daily. Samples given  2  . EPIPEN 2-PAK 0.3 MG/0.3ML SOAJ injection     . lithium carbonate 300 MG capsule Take 2 capsules (600 mg total) by mouth 2 (two) times daily with a meal. 60 capsule 1  . loratadine (CLARITIN) 10 MG tablet Take 10 mg by mouth daily.    . meloxicam (MOBIC) 15 MG tablet     . mometasone (NASONEX) 50 MCG/ACT nasal spray SHAKE LQ AND U 1 TO 2 SPRAYS IEN QD  6  . Multiple Vitamin (MULTIVITAMIN WITH MINERALS) TABS Take 1 tablet by mouth daily.    . sildenafil (VIAGRA) 100 MG tablet Take 100 mg by mouth as needed for erectile dysfunction. Reported on 10/31/2015    . Testosterone (ANDROGEL) 40.5 MG/2.5GM (1.62%) GEL Apply 2 pumps daily 2.5 g 5  . traMADol (ULTRAM) 50 MG tablet Take 50 mg by mouth every 6 (six) hours as needed.    . zolpidem (AMBIEN) 10 MG tablet Take 1 tablet (10 mg total) by mouth at bedtime. 30 tablet 2   No current facility-administered medications for this visit.    Previous Psychotropic Medications: Lithium Wellbutrin Abilify prozac zoloft celexa Depakote seroquel- could not function risperdal pristiq Lamotrigine Latuda belsorma Trazodone   Substance Abuse History in the last 12 months:  Yes.    Consequences of Substance Abuse: Negative NA  Medical Decision Making:  Review of Psycho-Social Stressors (1) and Review and summation of old records (2)  Treatment Plan Summary: Medication management     Discussed with patient at length about his medications treatment risks benefits and alternatives. Will continue on lithium carbonate 600 mg by mouth twice a day.  Also ordered his labs and he will get them done next week including his lithium level lipid panel CBC and CMP and thyroid function panel  Continue  Wellbutrin XL 150 mg in the morning. Patient was given samples last time and he has enough supply  at this time Continue Abilify 10 mg by mouth daily to control his mood stabilization He is not taking Ambien at this  time Patient will follow up in 1 months or earlier depending on his symptoms   More than 50% of the time spent in psychoeducation, counseling and coordination of care.   Time spent with the patient 30 minutes  This note was generated in part or whole with voice recognition software. Voice regonition is usually quite accurate but there are transcription errors that can and very often do occur. I apologize for any typographical errors that were not detected and corrected.    Rainey Pines, MD    3/23/20172:13 PM

## 2016-01-02 ENCOUNTER — Telehealth: Payer: Self-pay | Admitting: Family Medicine

## 2016-01-02 NOTE — Telephone Encounter (Signed)
Pt called to see if his FMLA papers were ready. I didn't see them up front or scanned in his file. Please advise. Thanks TNP

## 2016-01-02 NOTE — Telephone Encounter (Signed)
I cannot find the papers. Look for them last  Night.

## 2016-01-02 NOTE — Telephone Encounter (Signed)
[name] did you finish this? There was a message last week and a week before that that you were gonna finish it. Please review-aa

## 2016-01-02 NOTE — Telephone Encounter (Signed)
Form completed, lmtcb need to finish one more question on the form but need information from patient for this-aa

## 2016-01-03 NOTE — Telephone Encounter (Signed)
Pt is returning call.  CB#[phone]/MW °

## 2016-01-03 NOTE — Telephone Encounter (Signed)
Spoke with pt, obtained information required, will fax forms over today.

## 2016-01-29 ENCOUNTER — Ambulatory Visit (INDEPENDENT_AMBULATORY_CARE_PROVIDER_SITE_OTHER): Payer: Self-pay | Admitting: Psychiatry

## 2016-02-01 ENCOUNTER — Ambulatory Visit (INDEPENDENT_AMBULATORY_CARE_PROVIDER_SITE_OTHER): Payer: Self-pay | Admitting: Psychiatry

## 2016-02-12 ENCOUNTER — Ambulatory Visit (INDEPENDENT_AMBULATORY_CARE_PROVIDER_SITE_OTHER): Payer: 59 | Admitting: Psychiatry

## 2016-02-12 ENCOUNTER — Encounter: Payer: Self-pay | Admitting: Psychiatry

## 2016-02-12 VITALS — BP 124/84 | HR 80 | Temp 97.5°F | Ht 70.0 in | Wt 209.6 lb

## 2016-02-12 DIAGNOSIS — F3130 Bipolar disorder, current episode depressed, mild or moderate severity, unspecified: Secondary | ICD-10-CM | POA: Diagnosis not present

## 2016-02-12 DIAGNOSIS — F1020 Alcohol dependence, uncomplicated: Secondary | ICD-10-CM

## 2016-02-12 MED ORDER — BUPROPION HCL ER (XL) 150 MG PO TB24: 150.0000 mg | ORAL_TABLET | Freq: Every day | ORAL | Status: DC

## 2016-02-12 MED ORDER — LITHIUM CARBONATE 300 MG PO CAPS: 600.0000 mg | ORAL_CAPSULE | Freq: Two times a day (BID) | ORAL | Status: DC

## 2016-02-12 MED ORDER — LURASIDONE HCL 40 MG PO TABS: 40.0000 mg | ORAL_TABLET | Freq: Every day | ORAL | Status: DC

## 2016-02-12 NOTE — Progress Notes (Signed)
Psychiatric MD Progress Note   Patient Identification: Jeremiah [name] MRN:  [id number] Date of Evaluation:  02/12/2016 Referral Source: Dr. Bridgett Larsson Chief Complaint:   Chief Complaint    Follow-up; Medication Refill; Medication Problem; Weight Gain     Visit Diagnosis:    ICD-9-CM ICD-10-CM   1. Bipolar I disorder, most recent episode depressed ([place]) 296.50 F31.30   2. Alcohol dependence   303.93 F10.21    Diagnosis:   Patient Active Problem List   Diagnosis Date Noted  . Pedal edema [R60.0] 03/08/2015  . History of repair of rotator cuff [Z98.890] 02/17/2014  . Diabetes ([place]) [E11.9] 08/31/2013  . BP (high blood pressure) [I10] 08/31/2013  . Adult hypothyroidism [E03.9] 08/31/2013  . Obstructive apnea [G47.33] 08/31/2013  . Clinical depression [F32.9] 08/31/2013  . Diabetes mellitus ([place]) [E11.9] 08/31/2013  . Idiopathic localized osteoarthropathy [M19.91] 05/31/2013  . ABDOMINAL PAIN, LEFT UPPER QUADRANT [R10.12] 10/04/2009  . TARSAL TUNNEL SYNDROME, RIGHT [G57.50] 09/25/2009  . ANKLE PAIN, RIGHT [M25.579] 09/25/2009  . OTHER ANKLE SPRAIN AND STRAIN [id number] 09/25/2009  . BREAST MASS, RIGHT [N63] 01/07/2008  . INSOMNIA [G47.00] 01/07/2008  . GASTRIC ULCER, ACUTE [K25.3] 11/09/2007  . DUODENITIS [K29.80] 11/09/2007  . BLOOD IN STOOL, OCCULT [R19.5] 11/09/2007  . PERSONAL HISTORY OF COLONIC POLYPS [Z86.010] 11/09/2007  . BACK PAIN, LUMBAR [M54.5] 10/12/2007  . HELICOBACTER PYLORI GASTRITIS [A04.8] 09/09/2007  . GASTRIC ULCER, ACUTE, HEMORRHAGE [K25.0] 09/09/2007  . HIATAL HERNIA [K44.9] 09/09/2007  . COLONIC POLYPS, ADENOMATOUS [D12.6] 08/19/2007  . INTERNAL HEMORRHOIDS [K64.8] 08/19/2007  . EXTERNAL HEMORRHOIDS WITHOUT MENTION COMP [K64.4] 08/07/2007  . Blood in stool [K92.1] 08/07/2007  . ABDOMINAL PAIN, LEFT LOWER QUADRANT [R10.32] 08/07/2007  . SINUSITIS, ACUTE [J01.90] 06/12/2007  . ABDOMINAL PAIN [R10.9] 06/12/2007  . DIABETES MELLITUS, TYPE II [E11.9]  03/19/2007  . GOUT [M10.9] 03/19/2007  . DISORDER, CIRCADIAN RHYTHM SLEEP, NONORGANIC [G47.20, F51.8] 03/19/2007  . Depression [F32.9] 03/19/2007  . OSTEOARTHROSIS, GENERALIZED, MULTIPLE SITES [M15.9] 03/19/2007   History of Present Illness:   Patient is a 55 year old married male who presented for Follow-up. He reported that he Has been taking his medications and sister. He reported that he notices some paranoia with his medications and especially with his wife as she was talking about his work and his coworkers. However he kept that to himself and did not discuss with his wife. Patient reported that he wants to change his medications as he has noticed significant weight gain related to the Abilify. He has been compliant with his medications. He reported that he has taken Latuda in the past and would like to try the medication again as he does not remember the adverse reactions associated with the medication. Patient currently denied having any suicidal homicidal ideations or plans. He appeared calm and collective during the interview.   Past Medical History:  Past Medical History  Diagnosis Date  . Hypertension   . Sleep apnea     dx 1.5 yr ago...study @ [facility]  . Arthritis     bil knees  . Hypothyroidism   . Anxiety   . Depression     dx 2004  . H/O hiatal hernia     had surgery for that 2012  [facility]  . Diabetes mellitus without complication ([place])     type ll  controlled by weight & diet  . Colon polyp Dec 2015    Dr Allen Norris  . Sleep apnea     uses c pap  . Bipolar disorder (  [facility])     Past Surgical History  Procedure Laterality Date  . Rotator cuff repair      x 3  on right shoulder  . Shoulder surgery      x 3 on left  . Mandible surgery      1983  . Foot surgery      right foot 2012  . Appendectomy    . Shoulder arthroscopy with rotator cuff repair and subacromial decompression Left 03/04/2013    Procedure: LEFT SHOULDER ARTHROSCOPY WITH ROTATOR CUFF REPAIR AND  SUBACROMIAL DECOMPRESSION;  Surgeon: Marin Shutter, MD;  Location: [facility];  Service: Orthopedics;  Laterality: Left;  . Colonoscopy  Dec 2015    Dr Allen Norris  . Hernia repair      Left inguinal  . Hernia repair      [facility]atal [facility] at [facility]  . Excisional hemorrhoidectomy  01/02/15  . Replacement total knee bilateral      patient had knee replacement to his right side in 2015 and right 2014   Family History:  Family History  Problem Relation Age of Onset  . CAD Mother   . Arthritis Mother   . Angina Mother   . Diabetes Father   . Heart disease Father   . Colon polyps Father   . Heart attack Father   . Bipolar disorder Sister   . Asthma Sister   . Schizophrenia Sister   . Diabetes Maternal Grandmother   . Cancer Maternal Grandmother   . Heart attack Maternal Grandfather   . Stomach cancer Paternal Grandmother    Social History:   Social History   Social History  . Marital Status: Married    Spouse Name: N/A  . Number of Children: N/A  . Years of Education: N/A   Social History Main Topics  . Smoking status: Never Smoker   . Smokeless tobacco: Former Systems developer    Types: [place] date: 10/07/1989  . Alcohol Use: 3.6 - 4.8 oz/week    0 Glasses of wine, 0 Shots of liquor, 0 Standard drinks or equivalent, 6-8 Cans of beer per week     Comment: 2 beers per day  . Drug Use: No  . Sexual Activity: Yes   Other Topics Concern  . None   Social History Narrative   Additional Social History:  Married x 4 years. This is his second marrigae. Works at [employer] in [employer] division.  Both have children from the first marriages. Patient drinks alcohol on a regular basis. He reported that this is lite alcohol and he does not feel that it is affecting his medication.  Musculoskeletal: Strength & Muscle Tone: within normal limits Gait & Station: normal Patient leans: N/A  Psychiatric Specialty Exam: Anxiety Symptoms include insomnia.    Insomnia    Review of Systems   Psychiatric/Behavioral: The patient has insomnia.     Blood pressure 124/84, pulse 80, temperature 97.5 F (36.4 C), temperature source Tympanic, height 5\' 10"  (1.778 m), weight 209 lb 9.6 oz (95.074 kg), SpO2 91 %.Body mass index is 30.07 kg/(m^2).  General Appearance: Casual  Eye Contact:  Fair  Speech:  Clear and Coherent and Normal Rate  Volume:  Normal  Mood:  Anxious  Affect:  Congruent  Thought Process:  Coherent and Goal Directed  Orientation:  Full (Time, Place, and Person)  Thought Content:  WDL  Suicidal Thoughts:  No  Homicidal Thoughts:  No  Memory:  Immediate;   Fair  Judgement:  Fair  Insight:  Fair  Psychomotor Activity:  Normal  Concentration:  Fair  Recall:  [employer] [place]  Language: Fair  Akathisia:  No  Handed:  Right  AIMS (if indicated):    Assets:  Communication Skills Desire for Improvement Housing Intimacy Physical Health Social Support  ADL's:  Intact  Cognition: WNL  Sleep:  7-8   Is the patient at risk to self?  No. Has the patient been a risk to self in the past 6 months?  No. Has the patient been a risk to self within the distant past?  No. Is the patient a risk to others?  No. Has the patient been a risk to others in the past 6 months?  No. Has the patient been a risk to others within the distant past?  No.  Allergies:   Allergies  Allergen Reactions  . Tequin [Gatifloxacin] Anaphylaxis and Rash  . Amoxicillin Hives   Current Medications: Current Outpatient Prescriptions  Medication Sig Dispense Refill  . amLODipine (NORVASC) 10 MG tablet Take 5 mg by mouth daily.    . ANDROGEL PUMP 20.25 MG/ACT (1.62%) GEL APPLY 2 PUMPS QD UTD  5  . ARIPiprazole (ABILIFY) 10 MG tablet Take 1 tablet (10 mg total) by mouth daily. 30 tablet 1  . buPROPion (WELLBUTRIN XL) 150 MG 24 hr tablet Take 1 tablet (150 mg total) by mouth daily. Samples given- pt has supply 30 tablet 2  . EPIPEN 2-PAK 0.3 MG/0.3ML SOAJ injection     .  HYDROcodone-acetaminophen (NORCO/VICODIN) 5-325 MG tablet     . lithium carbonate 300 MG capsule Take 2 capsules (600 mg total) by mouth 2 (two) times daily with a meal. 60 capsule 1  . loratadine (CLARITIN) 10 MG tablet Take 10 mg by mouth daily.    . mometasone (NASONEX) 50 MCG/ACT nasal spray SHAKE LQ AND U 1 TO 2 SPRAYS IEN QD  6  . Multiple Vitamin (MULTIVITAMIN WITH MINERALS) TABS Take 1 tablet by mouth daily.    . sildenafil (VIAGRA) 100 MG tablet Take 100 mg by mouth as needed for erectile dysfunction. Reported on 10/31/2015    . Testosterone (ANDROGEL) 40.5 MG/2.5GM (1.62%) GEL Apply 2 pumps daily 2.5 g 5  . traMADol (ULTRAM) 50 MG tablet Take 50 mg by mouth every 6 (six) hours as needed.     No current facility-administered medications for this visit.    Previous Psychotropic Medications: Lithium Wellbutrin Abilify prozac zoloft celexa Depakote seroquel- could not function risperdal pristiq Lamotrigine Latuda belsorma Trazodone   Substance Abuse History in the last 12 months:  Yes.    Consequences of Substance Abuse: Negative NA  Medical Decision Making:  Review of Psycho-Social Stressors (1) and Review and summation of old records (2)  Treatment Plan Summary: Medication management     Discussed with patient at length about his medications treatment risks benefits and alternatives. Will continue on lithium carbonate 600 mg by mouth twice a day.   Continue  Wellbutrin XL 150 mg in the morning.  D/C the Abilify and I will start him on Latuda 40 mg daily. I will be giving him 2 prescriptions- 14 day supply for free medications 30 day supply to use with a coupon  Patient will follow up in 1 months or earlier depending on his symptoms   More than 50% of the time spent in psychoeducation, counseling and coordination of care.   Time spent with the patient 30 minutes  This note was generated  in part or whole with voice recognition software. Voice regonition is  usually quite accurate but there are transcription errors that can and very often do occur. I apologize for any typographical errors that were not detected and corrected.    Rainey Pines, MD    5/8/20172:10 PM

## 2016-02-20 ENCOUNTER — Ambulatory Visit: Payer: 59 | Admitting: Family Medicine

## 2016-03-05 ENCOUNTER — Telehealth: Payer: Self-pay

## 2016-03-05 ENCOUNTER — Ambulatory Visit (INDEPENDENT_AMBULATORY_CARE_PROVIDER_SITE_OTHER): Payer: 59 | Admitting: Family Medicine

## 2016-03-05 VITALS — BP 110/84 | HR 64 | Temp 98.2°F | Resp 12 | Wt 207.0 lb

## 2016-03-05 DIAGNOSIS — F32A Depression, unspecified: Secondary | ICD-10-CM

## 2016-03-05 DIAGNOSIS — R0789 Other chest pain: Secondary | ICD-10-CM | POA: Diagnosis not present

## 2016-03-05 DIAGNOSIS — R079 Chest pain, unspecified: Secondary | ICD-10-CM | POA: Insufficient documentation

## 2016-03-05 DIAGNOSIS — F329 Major depressive disorder, single episode, unspecified: Secondary | ICD-10-CM | POA: Diagnosis not present

## 2016-03-05 NOTE — Telephone Encounter (Signed)
Dr. Inis Sizer, a provider at patient's employer [employer]), called and reports that the patient was seen about 14mins ago and c/o left sided chest pain with tingling down his left arm. He reports that he was given aspirin 325mg  and his symptoms seem to have gotten better. However, due to patient's past medical history, he feels that patient should be seen in the office today. Advised [name] of above, and scheduled patient an appt to be seen today. Patient was advised through Dr. Inis Sizer of his appt. Advised if symptoms worsen in the meantime, he should go to the ER.

## 2016-03-05 NOTE — Progress Notes (Signed)
Patient ID: Jeremiah [name], male   DOB: 08-Jul-1961, 55 y.o.   MRN: [id number]    Subjective:  HPI  Patient is here to discuss chest pain. This morning around 8:30 am at work he developed chest pain on the left side, sharp that lasted till about 10 am. He also had nausea and when he was walking his left arm started to hurt. He saw doctor at work and was giving Aspirin there and was advised to follow up with PCP just incase. Symptoms resolved except feeling tired and felt winded going up the steps to our office. He also mentions yesterday he was walking at [place] and suddenly developed a tight/heavy sensation in his chest that lasted for about 30 seconds and then resolved. No other symptoms. He does not smoke. He has never seen a cardiologist before.  Prior to Admission medications   Medication Sig Start Date End Date Taking? Authorizing Provider  amLODipine (NORVASC) 10 MG tablet Take 5 mg by mouth daily.   Yes Historical Provider, MD  ANDROGEL PUMP 20.25 MG/ACT (1.62%) GEL APPLY 2 PUMPS QD UTD 01/10/16  Yes Historical Provider, MD  buPROPion (WELLBUTRIN XL) 150 MG 24 hr tablet Take 1 tablet (150 mg total) by mouth daily. Samples given- pt has supply 02/12/16  Yes Rainey Pines, MD  EPIPEN 2-PAK 0.3 MG/0.3ML SOAJ injection  11/07/15  Yes Historical Provider, MD  HYDROcodone-acetaminophen (NORCO/VICODIN) 5-325 MG tablet  12/28/15  Yes Historical Provider, MD  lithium carbonate 300 MG capsule Take 2 capsules (600 mg total) by mouth 2 (two) times daily with a meal. 02/12/16  Yes Rainey Pines, MD  loratadine (CLARITIN) 10 MG tablet Take 10 mg by mouth daily.   Yes Historical Provider, MD  lurasidone (LATUDA) 40 MG TABS tablet Take 1 tablet (40 mg total) by mouth daily after supper. 02/12/16  Yes Rainey Pines, MD  mometasone (NASONEX) 50 MCG/ACT nasal spray SHAKE LQ AND U 1 TO 2 SPRAYS IEN QD 11/07/15  Yes Historical Provider, MD  Multiple Vitamin (MULTIVITAMIN WITH MINERALS) TABS Take 1 tablet by mouth daily.    Yes Historical Provider, MD  sildenafil (VIAGRA) 100 MG tablet Take 100 mg by mouth as needed for erectile dysfunction. Reported on 10/31/2015   Yes Historical Provider, MD  Testosterone (ANDROGEL) 40.5 MG/2.5GM (1.62%) GEL Apply 2 pumps daily 10/20/15  Yes Zakyria Metzinger Maceo Pro., MD  traMADol (ULTRAM) 50 MG tablet Take 50 mg by mouth every 6 (six) hours as needed.   Yes Historical Provider, MD    Patient Active Problem List   Diagnosis Date Noted  . Pedal edema 03/08/2015  . History of repair of rotator cuff 02/17/2014  . Diabetes ([place]) 08/31/2013  . BP (high blood pressure) 08/31/2013  . Adult hypothyroidism 08/31/2013  . Obstructive apnea 08/31/2013  . Clinical depression 08/31/2013  . Diabetes mellitus ([place]) 08/31/2013  . Idiopathic localized osteoarthropathy 05/31/2013  . ABDOMINAL PAIN, LEFT UPPER QUADRANT 10/04/2009  . TARSAL TUNNEL SYNDROME, RIGHT 09/25/2009  . ANKLE PAIN, RIGHT 09/25/2009  . OTHER ANKLE SPRAIN AND STRAIN 09/25/2009  . BREAST MASS, RIGHT 01/07/2008  . INSOMNIA 01/07/2008  . GASTRIC ULCER, ACUTE 11/09/2007  . DUODENITIS 11/09/2007  . BLOOD IN STOOL, OCCULT 11/09/2007  . PERSONAL HISTORY OF COLONIC POLYPS 11/09/2007  . BACK PAIN, LUMBAR 10/12/2007  . HELICOBACTER PYLORI GASTRITIS 09/09/2007  . GASTRIC ULCER, ACUTE, HEMORRHAGE 09/09/2007  . HIATAL HERNIA 09/09/2007  . COLONIC POLYPS, ADENOMATOUS 08/19/2007  . INTERNAL HEMORRHOIDS 08/19/2007  . EXTERNAL HEMORRHOIDS WITHOUT  MENTION COMP 08/07/2007  . Blood in stool 08/07/2007  . ABDOMINAL PAIN, LEFT LOWER QUADRANT 08/07/2007  . SINUSITIS, ACUTE 06/12/2007  . ABDOMINAL PAIN 06/12/2007  . DIABETES MELLITUS, TYPE II 03/19/2007  . GOUT 03/19/2007  . DISORDER, CIRCADIAN RHYTHM SLEEP, NONORGANIC 03/19/2007  . Depression 03/19/2007  . OSTEOARTHROSIS, GENERALIZED, MULTIPLE SITES 03/19/2007    Past Medical History  Diagnosis Date  . Hypertension   . Sleep apnea     dx 1.5 yr ago...study @ [facility]  .  Arthritis     bil knees  . Hypothyroidism   . Anxiety   . Depression     dx 2004  . H/O hiatal hernia     had [facility] for that 2012  [facility]  . Diabetes mellitus without complication ([place])     type ll  controlled by weight & diet  . Colon polyp Dec 2015    Dr Allen Norris  . Sleep apnea     uses c pap  . Bipolar disorder [facility])     Social History   Social History  . Marital Status: Married    Spouse Name: N/A  . Number of Children: N/A  . Years of Education: N/A   Occupational History  . Not on file.   Social History Main Topics  . Smoking status: Never Smoker   . Smokeless tobacco: Former Systems developer    Types: [place] date: 10/07/1989  . Alcohol Use: 3.6 - 4.8 oz/week    0 Glasses of wine, 0 Shots of liquor, 0 Standard drinks or equivalent, 6-8 Cans of beer per week     Comment: 2 beers per day  . Drug Use: No  . Sexual Activity: Yes   Other Topics Concern  . Not on file   Social History Narrative    Allergies  Allergen Reactions  . Tequin [Gatifloxacin] Anaphylaxis and Rash  . Amoxicillin Hives    Review of Systems  Constitutional: Positive for malaise/fatigue.  Respiratory: Negative.   Cardiovascular: Negative.   Gastrointestinal: Negative.   Musculoskeletal: Negative.   Neurological: Negative.   Psychiatric/Behavioral: The patient is nervous/anxious.     Immunization History  Administered Date(s) Administered  . Td 02/22/2003  . Tdap 07/02/2011   Objective:  BP 110/84 mmHg  Pulse 64  Temp(Src) 98.2 F (36.8 C)  Resp 12  Wt 207 lb (93.895 kg)  SpO2 95%  Physical Exam  Constitutional: He is oriented to person, place, and time and well-developed, well-nourished, and in no distress.  HENT:  Head: Normocephalic and atraumatic.  Right Ear: External ear normal.  Left Ear: External ear normal.  Nose: Nose normal.  Eyes: Conjunctivae are normal. Pupils are equal, round, and reactive to light.  Neck: Normal range of motion. Neck supple.    Cardiovascular: Normal rate, regular rhythm, normal heart sounds and intact distal pulses.   No murmur heard. Pulmonary/Chest: Effort normal and breath sounds normal. No respiratory distress. He has no wheezes.  Abdominal: Soft. There is no tenderness. There is no rebound and no guarding.  Musculoskeletal: Normal range of motion. He exhibits no edema or tenderness.  No calf swelling or any sign of DVT.  Neurological: He is alert and oriented to person, place, and time. No cranial nerve deficit. Gait normal. Coordination normal.  Skin: Skin is warm and dry.  Psychiatric: Mood, memory, affect and judgment normal.    Lab Results  Component Value Date   WBC 5.6 07/12/2015   HGB 14.5 12/14/2014  HCT 49.7 07/12/2015   PLT 227 07/12/2015   GLUCOSE 137* 07/12/2015   CHOL 135 07/12/2015   TRIG 54 07/12/2015   HDL 62 07/12/2015   LDLCALC 62 07/12/2015   TSH 2.740 10/06/2015   PSA 0.39 03/16/2007   HGBA1C 6.4* 07/12/2015    CMP     Component Value Date/Time   NA 140 07/12/2015 1028   NA 134* 03/02/2013 1007   NA 141 10/27/2011 0010   K 5.4* 07/12/2015 1028   K 5.1 10/27/2011 0010   CL 103 07/12/2015 1028   CL 103 10/27/2011 0010   CO2 24 07/12/2015 1028   CO2 25 10/27/2011 0010   GLUCOSE 137* 07/12/2015 1028   GLUCOSE 78 03/02/2013 1007   GLUCOSE 135* 10/27/2011 0010   BUN 15 07/12/2015 1028   BUN 15 03/02/2013 1007   BUN 19* 10/27/2011 0010   CREATININE 1.20 07/12/2015 1028   CREATININE 0.98 10/27/2011 0010   CALCIUM 10.1 07/12/2015 1028   CALCIUM 8.5 10/27/2011 0010   PROT 7.7 07/12/2015 1028   PROT 7.2 02/12/2013 0958   PROT 7.0 10/27/2011 0010   ALBUMIN 4.9 07/12/2015 1028   ALBUMIN 4.0 02/12/2013 0958   ALBUMIN 3.6 10/27/2011 0010   AST 24 07/12/2015 1028   AST 62* 10/27/2011 0010   ALT 28 07/12/2015 1028   ALT 33 10/27/2011 0010   ALKPHOS 65 07/12/2015 1028   ALKPHOS 31* 10/27/2011 0010   BILITOT 1.1 07/12/2015 1028   BILITOT 0.7 02/12/2013 0958   BILITOT  0.6 10/27/2011 0010   GFRNONAA 67 07/12/2015 1028   GFRNONAA >60 10/27/2011 0010   GFRAA 78 07/12/2015 1028   GFRAA >60 10/27/2011 0010    Assessment and Plan :  1. Other chest pain New. Resolved now. EKG stable. Exam today is normal. Will refer to cardiologist-Dr. Nehemiah Massed today at 1:30 pm and patient is advised for further plan of care and to get cleared by the doctor. Needs at least basic cardiac workup for clearance to go back to work. Likely noncardiac but age and I have done the exam and reviewed the above chart and it is accurate to the best of my knowledge.  risk factors must be considered. - EKG 12-Lead - Ambulatory referral to Cardiology  2. Depression Stable.  Patient was seen and examined by Dr. Eulas Post and note was scribed by Theressa Millard, RMA.    Miguel Aschoff MD [facility] 03/05/2016 10:51 AM

## 2016-03-12 ENCOUNTER — Telehealth: Payer: Self-pay | Admitting: Gastroenterology

## 2016-03-12 NOTE — Telephone Encounter (Signed)
Last colonoscopy 2010. Patient was wondering if he due for another one yet? Please call

## 2016-03-14 ENCOUNTER — Ambulatory Visit: Payer: 59 | Admitting: Psychiatry

## 2016-03-15 NOTE — Telephone Encounter (Signed)
Pt's last colonoscopy was done on 09/20/2014. Per pathology and Dr. Allen Norris, pt is to repeat in 3 years. 09/2017. Pt has been notified of this.

## 2016-03-20 ENCOUNTER — Encounter: Payer: Self-pay | Admitting: Psychiatry

## 2016-03-28 ENCOUNTER — Encounter: Payer: Self-pay | Admitting: Psychiatry

## 2016-03-28 ENCOUNTER — Ambulatory Visit (INDEPENDENT_AMBULATORY_CARE_PROVIDER_SITE_OTHER): Payer: 59 | Admitting: Psychiatry

## 2016-03-28 VITALS — BP 122/78 | HR 68 | Temp 97.3°F | Ht 70.0 in | Wt 207.8 lb

## 2016-03-28 DIAGNOSIS — F1020 Alcohol dependence, uncomplicated: Secondary | ICD-10-CM

## 2016-03-28 DIAGNOSIS — F3110 Bipolar disorder, current episode manic without psychotic features, unspecified: Secondary | ICD-10-CM

## 2016-03-28 NOTE — Progress Notes (Signed)
Psychiatric MD Progress Note   Patient Identification: Jeremiah [name] MRN:  [id number] Date of Evaluation:  03/28/2016 Referral Source: Dr. Bridgett Larsson Chief Complaint:   Chief Complaint    Follow-up; Medication Refill     Visit Diagnosis:    ICD-9-CM ICD-10-CM   1. Bipolar I disorder, most recent episode depressed ([place]) 296.50 F31.30   2. Alcohol dependence   303.93 F10.21    Diagnosis:   Patient Active Problem List   Diagnosis Date Noted  . Chest pain [R07.9] 03/05/2016  . Pedal edema [R60.0] 03/08/2015  . History of repair of rotator cuff [Z98.890] 02/17/2014  . Diabetes ([place]) [E11.9] 08/31/2013  . BP (high blood pressure) [I10] 08/31/2013  . Adult hypothyroidism [E03.9] 08/31/2013  . Obstructive apnea [G47.33] 08/31/2013  . Clinical depression [F32.9] 08/31/2013  . Diabetes mellitus ([place]) [E11.9] 08/31/2013  . Idiopathic localized osteoarthropathy [M19.91] 05/31/2013  . ABDOMINAL PAIN, LEFT UPPER QUADRANT [R10.12] 10/04/2009  . TARSAL TUNNEL SYNDROME, RIGHT [G57.50] 09/25/2009  . ANKLE PAIN, RIGHT [M25.579] 09/25/2009  . OTHER ANKLE SPRAIN AND STRAIN [id number] 09/25/2009  . BREAST MASS, RIGHT [N63] 01/07/2008  . INSOMNIA [G47.00] 01/07/2008  . GASTRIC ULCER, ACUTE [K25.3] 11/09/2007  . DUODENITIS [K29.80] 11/09/2007  . BLOOD IN STOOL, OCCULT [R19.5] 11/09/2007  . PERSONAL HISTORY OF COLONIC POLYPS [Z86.010] 11/09/2007  . BACK PAIN, LUMBAR [M54.5] 10/12/2007  . HELICOBACTER PYLORI GASTRITIS [A04.8] 09/09/2007  . GASTRIC ULCER, ACUTE, HEMORRHAGE [K25.0] 09/09/2007  . HIATAL HERNIA [K44.9] 09/09/2007  . COLONIC POLYPS, ADENOMATOUS [D12.6] 08/19/2007  . INTERNAL HEMORRHOIDS [K64.8] 08/19/2007  . EXTERNAL HEMORRHOIDS WITHOUT MENTION COMP [K64.4] 08/07/2007  . Blood in stool [K92.1] 08/07/2007  . ABDOMINAL PAIN, LEFT LOWER QUADRANT [R10.32] 08/07/2007  . SINUSITIS, ACUTE [J01.90] 06/12/2007  . ABDOMINAL PAIN [R10.9] 06/12/2007  . DIABETES MELLITUS, TYPE II [E11.9]  03/19/2007  . GOUT [M10.9] 03/19/2007  . DISORDER, CIRCADIAN RHYTHM SLEEP, NONORGANIC [G47.20, F51.8] 03/19/2007  . Depression [F32.9] 03/19/2007  . OSTEOARTHROSIS, GENERALIZED, MULTIPLE SITES [M15.9] 03/19/2007   History of Present Illness:   Patient is a 55 year old married male who presented for Follow-up. He reported that he Is improving since she was started on the that would've. He reported that he was given 14 day supply of the day to do as samples. He has noted significant improvement in his paranoia is improving. He currently denied having any side effects of the medication. Patient reported that he is feeling more energetic and has started working in his yard. He has been losing weight as he is exercising on a daily basis. He will go to the gym and is also doing some gardening. He feels motivated by daily basis. He currently denied having any suicidal ideations or plans. He denied having any perceptual disturbances.   He appeared alert and cooperative during the interview.   Patient currently denied having any suicidal homicidal ideations or plans.   Past Medical History:  Past Medical History  Diagnosis Date  . Hypertension   . Sleep apnea     dx 1.5 yr ago...study @ [facility]  . Arthritis     bil knees  . Hypothyroidism   . Anxiety   . Depression     dx 2004  . H/O hiatal hernia     had surgery for that 2012  [facility]  . Diabetes mellitus without complication ([place])     type ll  controlled by weight & diet  . Colon polyp Dec 2015    Dr Allen Norris  . Sleep apnea  uses c pap  . Bipolar disorder [facility])     Past Surgical History  Procedure Laterality Date  . Rotator cuff repair      x 3  on right shoulder  . Shoulder surgery      x 3 on left  . Mandible surgery      1983  . Foot surgery      right foot 2012  . Appendectomy    . Shoulder arthroscopy with rotator cuff repair and subacromial decompression Left 03/04/2013    Procedure: LEFT SHOULDER ARTHROSCOPY WITH  ROTATOR CUFF REPAIR AND SUBACROMIAL DECOMPRESSION;  Surgeon: Marin Shutter, MD;  Location: [facility];  Service: Orthopedics;  Laterality: Left;  . Colonoscopy  Dec 2015    Dr Allen Norris  . Hernia repair      Left inguinal  . Hernia repair      hiatal hernia at [facility]  . Excisional hemorrhoidectomy  01/02/15  . Replacement total knee bilateral      patient had knee replacement to his right side in 2015 and right 2014   Family History:  Family History  Problem Relation Age of Onset  . CAD Mother   . Arthritis Mother   . Angina Mother   . Diabetes Father   . Heart disease Father   . Colon polyps Father   . Heart attack Father   . Bipolar disorder Sister   . Asthma Sister   . Schizophrenia Sister   . Diabetes Maternal Grandmother   . Cancer Maternal Grandmother   . Heart attack Maternal Grandfather   . Stomach cancer Paternal Grandmother    Social History:   Social History   Social History  . Marital Status: Married    Spouse Name: N/A  . Number of Children: N/A  . Years of Education: N/A   Social History Main Topics  . Smoking status: Never Smoker   . Smokeless tobacco: Former Systems developer    Types: [place] date: 10/07/1989  . Alcohol Use: 3.6 - 4.8 oz/week    0 Glasses of wine, 0 Shots of liquor, 0 Standard drinks or equivalent, 6-8 Cans of beer per week     Comment: 2 beers per day  . Drug Use: No  . Sexual Activity: Yes   Other Topics Concern  . None   Social History Narrative   Additional Social History:  Married x 4 years. This is his second marrigae. Works at [employer] in [employer] division.  Both have children from the first marriages. Patient drinks alcohol on a regular basis. He reported that this is lite alcohol and he does not feel that it is affecting his medication.  Musculoskeletal: Strength & Muscle Tone: within normal limits Gait & Station: normal Patient leans: N/A  Psychiatric Specialty Exam: Anxiety Symptoms include insomnia.    Insomnia    Review of Systems   Psychiatric/Behavioral: The patient has insomnia.     Blood pressure 122/78, pulse 68, temperature 97.3 F (36.3 C), temperature source Tympanic, height 5\' 10"  (1.778 m), weight 207 lb 12.8 oz (94.257 kg), SpO2 95 %.Body mass index is 29.82 kg/(m^2).  General Appearance: Casual  Eye Contact:  Fair  Speech:  Clear and Coherent and Normal Rate  Volume:  Normal  Mood:  Anxious  Affect:  Congruent  Thought Process:  Coherent and Goal Directed  Orientation:  Full (Time, Place, and Person)  Thought Content:  WDL  Suicidal Thoughts:  No  Homicidal Thoughts:  No  Memory:  Immediate;   Fair  Judgement:  Fair  Insight:  Fair  Psychomotor Activity:  Normal  Concentration:  Fair  Recall:  [employer] [place]  Language: Fair  Akathisia:  No  Handed:  Right  AIMS (if indicated):    Assets:  Communication Skills Desire for Improvement Housing Intimacy Physical Health Social Support  ADL's:  Intact  Cognition: WNL  Sleep:  7-8   Is the patient at risk to self?  No. Has the patient been a risk to self in the past 6 months?  No. Has the patient been a risk to self within the distant past?  No. Is the patient a risk to others?  No. Has the patient been a risk to others in the past 6 months?  No. Has the patient been a risk to others within the distant past?  No.  Allergies:   Allergies  Allergen Reactions  . Tequin [Gatifloxacin] Anaphylaxis and Rash  . Amoxicillin Hives   Current Medications: Current Outpatient Prescriptions  Medication Sig Dispense Refill  . amLODipine (NORVASC) 10 MG tablet Take 5 mg by mouth daily.    . ANDROGEL PUMP 20.25 MG/ACT (1.62%) GEL APPLY 2 PUMPS QD UTD  5  . buPROPion (WELLBUTRIN XL) 150 MG 24 hr tablet Take 1 tablet (150 mg total) by mouth daily. Samples given- pt has supply 30 tablet 2  . EPIPEN 2-PAK 0.3 MG/0.3ML SOAJ injection     . HYDROcodone-acetaminophen (NORCO/VICODIN) 5-325 MG tablet     . lithium carbonate 300 MG capsule Take  2 capsules (600 mg total) by mouth 2 (two) times daily with a meal. 60 capsule 1  . loratadine (CLARITIN) 10 MG tablet Take 10 mg by mouth daily.    [name] lurasidone (LATUDA) 40 MG TABS tablet Take 1 tablet (40 mg total) by mouth daily after supper. 30 tablet 1  . meloxicam (MOBIC) 15 MG tablet Take by mouth.    . mometasone (NASONEX) 50 MCG/ACT nasal spray SHAKE LQ AND U 1 TO 2 SPRAYS IEN QD  6  . Multiple Vitamin (MULTIVITAMIN WITH MINERALS) TABS Take 1 tablet by mouth daily.    . sildenafil (VIAGRA) 100 MG tablet Take 100 mg by mouth as needed for erectile dysfunction. Reported on 10/31/2015    . Testosterone (ANDROGEL) 40.5 MG/2.5GM (1.62%) GEL Apply 2 pumps daily 2.5 g 5  . traMADol (ULTRAM) 50 MG tablet Take 50 mg by mouth every 6 (six) hours as needed.    . zolpidem (AMBIEN) 10 MG tablet TK 1 T PO QHS  2   No current facility-administered medications for this visit.    Previous Psychotropic Medications: Lithium Wellbutrin Abilify prozac zoloft celexa Depakote seroquel- could not function risperdal pristiq Lamotrigine Latuda belsorma Trazodone   Substance Abuse History in the last 12 months:  Yes.    Consequences of Substance Abuse: Negative NA  Medical Decision Making:  Review of Psycho-Social Stressors (1) and Review and summation of old records (2)  Treatment Plan Summary: Medication management     Discussed with patient at length about his medications treatment risks benefits and alternatives.  Will continue on lithium carbonate 600 mg by mouth twice a day.  Continue  Wellbutrin XL 150 mg in the morning.  Continue  Latuda 40 mg daily. 30 day supply to use with a coupon  Patient will follow up in 1 months or earlier depending on his symptoms   More than 50% of the time spent in psychoeducation, counseling and coordination  of care.   Time spent with the patient 30 minutes  This note was generated in part or whole with voice recognition software. Voice  regonition is usually quite accurate but there are transcription errors that can and very often do occur. I apologize for any typographical errors that were not detected and corrected.    Rainey Pines, MD    6/22/20171:25 PM

## 2016-04-02 ENCOUNTER — Ambulatory Visit: Payer: Self-pay | Admitting: Family Medicine

## 2016-04-03 ENCOUNTER — Telehealth: Payer: Self-pay | Admitting: Psychiatry

## 2016-04-03 NOTE — Telephone Encounter (Signed)
Labs reviewed.  Li Level Low- 0.3  TSH- N Lipids- N

## 2016-04-22 ENCOUNTER — Other Ambulatory Visit: Payer: Self-pay | Admitting: Family Medicine

## 2016-04-24 ENCOUNTER — Ambulatory Visit: Payer: 59 | Admitting: Psychiatry

## 2016-05-06 ENCOUNTER — Encounter: Payer: Self-pay | Admitting: Family Medicine

## 2016-05-06 ENCOUNTER — Ambulatory Visit (INDEPENDENT_AMBULATORY_CARE_PROVIDER_SITE_OTHER): Payer: 59 | Admitting: Family Medicine

## 2016-05-06 VITALS — BP 124/80 | HR 82 | Temp 97.5°F | Resp 16 | Wt 212.0 lb

## 2016-05-06 DIAGNOSIS — R7303 Prediabetes: Secondary | ICD-10-CM

## 2016-05-06 DIAGNOSIS — R5383 Other fatigue: Secondary | ICD-10-CM

## 2016-05-06 DIAGNOSIS — F3130 Bipolar disorder, current episode depressed, mild or moderate severity, unspecified: Secondary | ICD-10-CM

## 2016-05-06 DIAGNOSIS — E038 Other specified hypothyroidism: Secondary | ICD-10-CM

## 2016-05-06 DIAGNOSIS — F32A Depression, unspecified: Secondary | ICD-10-CM

## 2016-05-06 DIAGNOSIS — E291 Testicular hypofunction: Secondary | ICD-10-CM | POA: Diagnosis not present

## 2016-05-06 DIAGNOSIS — F329 Major depressive disorder, single episode, unspecified: Secondary | ICD-10-CM | POA: Diagnosis not present

## 2016-05-06 DIAGNOSIS — F319 Bipolar disorder, unspecified: Secondary | ICD-10-CM

## 2016-05-06 NOTE — Progress Notes (Signed)
Subjective:  HPI Pt is here today for fatigue. He reports that he has a hard time getting up in the mornings and all he wants to do is sleep and falls asleep during the day. He feels like he may need his medications adjusted by a new psychiatrist. He does not feel he and his current psychiatrist are on the same page. He also is questioning if it is related to his testosterone levels. He has had a sleep study and has OSA and knows he needs it adjusted but has an appointment for that already. Denies chest pain, shortness of breath, headaches or weakness.   Prior to Admission medications   Medication Sig Start Date End Date Taking? Authorizing Provider  amLODipine (NORVASC) 10 MG tablet Take 5 mg by mouth daily.   Yes Historical Provider, MD  buPROPion (WELLBUTRIN XL) 150 MG 24 hr tablet Take 1 tablet (150 mg total) by mouth daily. Samples given- pt has supply 02/12/16  Yes Rainey Pines, MD  EPIPEN 2-PAK 0.3 MG/0.3ML SOAJ injection  11/07/15  Yes Historical Provider, MD  HYDROcodone-acetaminophen (NORCO/VICODIN) 5-325 MG tablet  12/28/15  Yes Historical Provider, MD  lithium carbonate 300 MG capsule Take 2 capsules (600 mg total) by mouth 2 (two) times daily with a meal. 02/12/16  Yes Rainey Pines, MD  loratadine (CLARITIN) 10 MG tablet Take 10 mg by mouth daily.   Yes Historical Provider, MD  lurasidone (LATUDA) 40 MG TABS tablet Take 1 tablet (40 mg total) by mouth daily after supper. 02/12/16  Yes Rainey Pines, MD  meloxicam (MOBIC) 15 MG tablet Take by mouth. 11/16/13  Yes Historical Provider, MD  mometasone (NASONEX) 50 MCG/ACT nasal spray SHAKE LQ AND U 1 TO 2 SPRAYS IEN QD 11/07/15  Yes Historical Provider, MD  Multiple Vitamin (MULTIVITAMIN WITH MINERALS) TABS Take 1 tablet by mouth daily.   Yes Historical Provider, MD  Testosterone (ANDROGEL) 40.5 MG/2.5GM (1.62%) GEL Apply 2 pumps daily 10/20/15  Yes Richard Maceo Pro., MD  traMADol (ULTRAM) 50 MG tablet Take 50 mg by mouth every 6 (six) hours as  needed.   Yes Historical Provider, MD  zolpidem (AMBIEN) 10 MG tablet TK 1 T PO QHS 03/12/16  Yes Historical Provider, MD    Patient Active Problem List   Diagnosis Date Noted  . Chest pain 03/05/2016  . Pedal edema 03/08/2015  . History of repair of rotator cuff 02/17/2014  . Diabetes ([place]) 08/31/2013  . BP (high blood pressure) 08/31/2013  . Adult hypothyroidism 08/31/2013  . Obstructive apnea 08/31/2013  . Clinical depression 08/31/2013  . Diabetes mellitus ([place]) 08/31/2013  . Idiopathic localized osteoarthropathy 05/31/2013  . ABDOMINAL PAIN, LEFT UPPER QUADRANT 10/04/2009  . TARSAL TUNNEL SYNDROME, RIGHT 09/25/2009  . ANKLE PAIN, RIGHT 09/25/2009  . OTHER ANKLE SPRAIN AND STRAIN 09/25/2009  . BREAST MASS, RIGHT 01/07/2008  . INSOMNIA 01/07/2008  . GASTRIC ULCER, ACUTE 11/09/2007  . DUODENITIS 11/09/2007  . BLOOD IN STOOL, OCCULT 11/09/2007  . PERSONAL HISTORY OF COLONIC POLYPS 11/09/2007  . BACK PAIN, LUMBAR 10/12/2007  . HELICOBACTER PYLORI GASTRITIS 09/09/2007  . GASTRIC ULCER, ACUTE, HEMORRHAGE 09/09/2007  . HIATAL HERNIA 09/09/2007  . COLONIC POLYPS, ADENOMATOUS 08/19/2007  . INTERNAL HEMORRHOIDS 08/19/2007  . EXTERNAL HEMORRHOIDS WITHOUT MENTION COMP 08/07/2007  . Blood in stool 08/07/2007  . ABDOMINAL PAIN, LEFT LOWER QUADRANT 08/07/2007  . SINUSITIS, ACUTE 06/12/2007  . ABDOMINAL PAIN 06/12/2007  . DIABETES MELLITUS, TYPE II 03/19/2007  . GOUT 03/19/2007  . DISORDER,  CIRCADIAN RHYTHM SLEEP, NONORGANIC 03/19/2007  . Depression 03/19/2007  . OSTEOARTHROSIS, GENERALIZED, MULTIPLE SITES 03/19/2007    Past Medical History:  Diagnosis Date  . Anxiety   . Arthritis    bil knees  . Bipolar disorder ([place])   . Colon polyp Dec 2015   Dr Allen Norris  . Depression    dx 2004  . Diabetes mellitus without complication ([place])    type ll  controlled by weight & diet  . H/O hiatal hernia    had surgery for that 2012  [facility]  . Hypertension   . Hypothyroidism   . Sleep  apnea    dx 1.5 yr ago...study @ [place]  . Sleep apnea    uses c pap    Social History   Social History  . Marital status: Married    Spouse name: N/A  . Number of children: N/A  . Years of education: N/A   Occupational History  . Not on file.   Social History Main Topics  . Smoking status: Never Smoker  . Smokeless tobacco: Former Systems developer    Types: Chew    Quit date: 10/07/1989  . Alcohol use 3.6 - 4.8 oz/week    6 - 8 Cans of beer per week     Comment: 2 beers per day  . Drug use: No  . Sexual activity: Yes   Other Topics Concern  . Not on file   Social History Narrative  . No narrative on file    Allergies  Allergen Reactions  . Tequin [Gatifloxacin] Anaphylaxis and Rash  . Amoxicillin Hives    Review of Systems  Constitutional: Positive for malaise/fatigue.  HENT: Negative.   Eyes: Negative.   Respiratory: Negative.   Cardiovascular: Negative.   Gastrointestinal: Negative.   Genitourinary: Negative.   Musculoskeletal: Negative.   Skin: Negative.   Neurological: Negative.   Endo/Heme/Allergies: Negative.   Psychiatric/Behavioral: Negative.     Immunization History  Administered Date(s) Administered  . Td 02/22/2003  . Tdap 07/02/2011   Objective:  BP 124/80   Pulse 82   Temp 97.5 F (36.4 C) (Oral)   Resp 16   Wt 212 lb (96.2 kg)   BMI 30.42 kg/m   Physical Exam  Constitutional: He is oriented to person, place, and time and well-developed, well-nourished, and in no distress.  Eyes: Conjunctivae and EOM are normal. Pupils are equal, round, and reactive to light.  Neck: Normal range of motion. Neck supple.  Cardiovascular: Normal rate, regular rhythm, normal heart sounds and intact distal pulses.   Pulmonary/Chest: Effort normal and breath sounds normal.  Musculoskeletal: Normal range of motion.  Neurological: He is alert and oriented to person, place, and time. He has normal reflexes. Gait normal. GCS score is 15.  Skin: Skin is warm and  dry.  Psychiatric: Mood, memory, affect and judgment normal.    Lab Results  Component Value Date   WBC 5.6 07/12/2015   HGB 14.5 12/14/2014   HCT 49.7 07/12/2015   PLT 227 07/12/2015   GLUCOSE 137 (H) 07/12/2015   CHOL 135 07/12/2015   TRIG 54 07/12/2015   HDL 62 07/12/2015   LDLCALC 62 07/12/2015   TSH 2.740 10/06/2015   PSA 0.39 03/16/2007   HGBA1C 6.4 (H) 07/12/2015    CMP     Component Value Date/Time   NA 140 07/12/2015 1028   NA 141 10/27/2011 0010   K 5.4 (H) 07/12/2015 1028   K 5.1 10/27/2011 0010  CL 103 07/12/2015 1028   CL 103 10/27/2011 0010   CO2 24 07/12/2015 1028   CO2 25 10/27/2011 0010   GLUCOSE 137 (H) 07/12/2015 1028   GLUCOSE 78 03/02/2013 1007   GLUCOSE 135 (H) 10/27/2011 0010   BUN 15 07/12/2015 1028   BUN 19 (H) 10/27/2011 0010   CREATININE 1.20 07/12/2015 1028   CREATININE 0.98 10/27/2011 0010   CALCIUM 10.1 07/12/2015 1028   CALCIUM 8.5 10/27/2011 0010   PROT 7.7 07/12/2015 1028   PROT 7.0 10/27/2011 0010   ALBUMIN 4.9 07/12/2015 1028   ALBUMIN 3.6 10/27/2011 0010   AST 24 07/12/2015 1028   AST 62 (H) 10/27/2011 0010   ALT 28 07/12/2015 1028   ALT 33 10/27/2011 0010   ALKPHOS 65 07/12/2015 1028   ALKPHOS 31 (L) 10/27/2011 0010   BILITOT 1.1 07/12/2015 1028   BILITOT 0.6 10/27/2011 0010   GFRNONAA 67 07/12/2015 1028   GFRNONAA >60 10/27/2011 0010   GFRAA 78 07/12/2015 1028   GFRAA >60 10/27/2011 0010    Assessment and Plan :  1. Other fatigue Possibly related to [place], pt will call psychiatrist and ask.  - CBC with Differential/Platelet - Comprehensive metabolic panel - Testosterone,Free and Total Patient has multiple issues which could lead to fatigue. Diet and exercise stressed. Check testosterone level. Other issues are up to psychiatric intervention. 2. Bipolar depression ([place])  - Ambulatory referral to Psychiatry  3. Depression  - Ambulatory referral to Psychiatry  4. Hypogonadism male  - Testosterone,Free and  Total  5. Other specified hypothyroidism  - TSH  6. Borderline diabetes  - Hemoglobin A1c   Patient was seen and examined by Dr. Miguel Aschoff, and noted scribed by Webb Laws, [place] MD [facility] 05/06/2016 4:11 PM

## 2016-05-08 LAB — CBC WITH DIFFERENTIAL/PLATELET
Basophils Absolute: 0 10*3/uL (ref 0.0–0.2)
Basos: 1 %
EOS (ABSOLUTE): 0.2 10*3/uL (ref 0.0–0.4)
Eos: 3 %
Hematocrit: 45.4 % (ref 37.5–51.0)
Hemoglobin: 16.1 g/dL (ref 12.6–17.7)
Immature Grans (Abs): 0 10*3/uL (ref 0.0–0.1)
Immature Granulocytes: 0 %
Lymphocytes Absolute: 1.2 10*3/uL (ref 0.7–3.1)
Lymphs: 19 %
MCH: 31.7 pg (ref 26.6–33.0)
MCHC: 35.5 g/dL (ref 31.5–35.7)
MCV: 89 fL (ref 79–97)
Monocytes Absolute: 0.5 10*3/uL (ref 0.1–0.9)
Monocytes: 8 %
Neutrophils Absolute: 4.2 10*3/uL (ref 1.4–7.0)
Neutrophils: 69 %
Platelets: 207 10*3/uL (ref 150–379)
RBC: 5.08 x10E6/uL (ref 4.14–5.80)
RDW: 13.5 % (ref 12.3–15.4)
WBC: 6.1 10*3/uL (ref 3.4–10.8)

## 2016-05-08 LAB — COMPREHENSIVE METABOLIC PANEL
ALT: 53 IU/L — ABNORMAL HIGH (ref 0–44)
AST: 34 IU/L (ref 0–40)
Albumin/Globulin Ratio: 2 (ref 1.2–2.2)
Albumin: 5 g/dL (ref 3.5–5.5)
Alkaline Phosphatase: 55 IU/L (ref 39–117)
BUN/Creatinine Ratio: 15 (ref 9–20)
BUN: 16 mg/dL (ref 6–24)
Bilirubin Total: 1 mg/dL (ref 0.0–1.2)
CO2: 22 mmol/L (ref 18–29)
Calcium: 10.1 mg/dL (ref 8.7–10.2)
Chloride: 100 mmol/L (ref 96–106)
Creatinine, Ser: 1.08 mg/dL (ref 0.76–1.27)
GFR calc Af Amer: 88 mL/min/{1.73_m2} (ref >59)
GFR calc non Af Amer: 76 mL/min/{1.73_m2} (ref >59)
Globulin, Total: 2.5 g/dL (ref 1.5–4.5)
Glucose: 171 mg/dL — ABNORMAL HIGH (ref 65–99)
Potassium: 5 mmol/L (ref 3.5–5.2)
Sodium: 138 mmol/L (ref 134–144)
Total Protein: 7.5 g/dL (ref 6.0–8.5)

## 2016-05-08 LAB — TSH: TSH: 4.57 u[IU]/mL — ABNORMAL HIGH (ref 0.450–4.500)

## 2016-05-08 LAB — HEMOGLOBIN A1C
Est. average glucose Bld gHb Est-mCnc: 134 mg/dL
Hgb A1c MFr Bld: 6.3 % — ABNORMAL HIGH (ref 4.8–5.6)

## 2016-05-08 LAB — TESTOSTERONE,FREE AND TOTAL
Testosterone, Free: 5 pg/mL — ABNORMAL LOW (ref 7.2–24.0)
Testosterone: 208 ng/dL — ABNORMAL LOW (ref 264–916)

## 2016-05-09 ENCOUNTER — Telehealth: Payer: Self-pay | Admitting: Family Medicine

## 2016-05-09 MED ORDER — LEVOTHYROXINE SODIUM 25 MCG PO TABS: 25.0000 ug | ORAL_TABLET | Freq: Every day | ORAL | 12 refills | Status: DC

## 2016-05-09 MED ORDER — TESTOSTERONE 40.5 MG/2.5GM (1.62%) TD GEL: TRANSDERMAL | 5 refills | Status: DC

## 2016-05-09 NOTE — Telephone Encounter (Signed)
Pt is returning call.  [id number]

## 2016-05-15 ENCOUNTER — Ambulatory Visit: Payer: Self-pay | Admitting: Family Medicine

## 2016-06-27 ENCOUNTER — Ambulatory Visit: Payer: 59 | Admitting: Psychiatry

## 2016-07-01 ENCOUNTER — Telehealth: Payer: Self-pay

## 2016-07-01 NOTE — Telephone Encounter (Signed)
called in left a voice message for ok to refill byupropionxl 150mg  take 1 tablet by mouth daily with no additional refills.

## 2016-07-01 NOTE — Telephone Encounter (Signed)
received a fax requesting a refill on bupropion xl . pt was last seen on  03-28-16 next appt  07-04-16

## 2016-07-01 NOTE — Telephone Encounter (Signed)
correction the medication was bupropion xl 150mg  .  rx was called in ok for refill with no additional refills

## 2016-07-04 ENCOUNTER — Encounter: Payer: Self-pay | Admitting: Psychiatry

## 2016-07-04 ENCOUNTER — Ambulatory Visit (INDEPENDENT_AMBULATORY_CARE_PROVIDER_SITE_OTHER): Payer: 59 | Admitting: Psychiatry

## 2016-07-04 VITALS — BP 137/91 | HR 69 | Temp 98.8°F | Wt 207.8 lb

## 2016-07-04 DIAGNOSIS — F3130 Bipolar disorder, current episode depressed, mild or moderate severity, unspecified: Secondary | ICD-10-CM | POA: Diagnosis not present

## 2016-07-04 DIAGNOSIS — F319 Bipolar disorder, unspecified: Secondary | ICD-10-CM

## 2016-07-04 DIAGNOSIS — F1020 Alcohol dependence, uncomplicated: Secondary | ICD-10-CM

## 2016-07-04 MED ORDER — ZOLPIDEM TARTRATE 10 MG PO TABS: 10.0000 mg | ORAL_TABLET | Freq: Every evening | ORAL | 2 refills | Status: DC | PRN

## 2016-07-04 MED ORDER — LITHIUM CARBONATE 300 MG PO CAPS: 600.0000 mg | ORAL_CAPSULE | Freq: Two times a day (BID) | ORAL | 1 refills | Status: DC

## 2016-07-04 MED ORDER — BUPROPION HCL ER (XL) 150 MG PO TB24: 150.0000 mg | ORAL_TABLET | Freq: Every day | ORAL | 2 refills | Status: DC

## 2016-07-04 NOTE — Progress Notes (Signed)
Psychiatric MD Progress Note   Patient Identification: Jeremiah [name] MRN:  [id number] Date of Evaluation:  07/04/2016 Referral Source: Dr. Bridgett Larsson Chief Complaint:   Chief Complaint    Follow-up; Medication Refill     Visit Diagnosis:    ICD-9-CM ICD-10-CM   1. Bipolar I disorder, most recent episode depressed ([place]) 296.50 F31.30   2. Alcohol dependence   303.93 F10.21    Diagnosis:   Patient Active Problem List   Diagnosis Date Noted  . Chest pain [R07.9] 03/05/2016  . Pedal edema [R60.0] 03/08/2015  . History of repair of rotator cuff [Z98.890] 02/17/2014  . Diabetes ([place]) [E11.9] 08/31/2013  . BP (high blood pressure) [I10] 08/31/2013  . Adult hypothyroidism [E03.9] 08/31/2013  . Obstructive apnea [G47.33] 08/31/2013  . Clinical depression [F32.9] 08/31/2013  . Diabetes mellitus ([place]) [E11.9] 08/31/2013  . Idiopathic localized osteoarthropathy [M19.91] 05/31/2013  . ABDOMINAL PAIN, LEFT UPPER QUADRANT [R10.12] 10/04/2009  . TARSAL TUNNEL SYNDROME, RIGHT [G57.50] 09/25/2009  . ANKLE PAIN, RIGHT [M25.579] 09/25/2009  . OTHER ANKLE SPRAIN AND STRAIN [id number] 09/25/2009  . BREAST MASS, RIGHT [N63] 01/07/2008  . INSOMNIA [G47.00] 01/07/2008  . GASTRIC ULCER, ACUTE [K25.3] 11/09/2007  . DUODENITIS [K29.80] 11/09/2007  . BLOOD IN STOOL, OCCULT [R19.5] 11/09/2007  . PERSONAL HISTORY OF COLONIC POLYPS [Z86.010] 11/09/2007  . BACK PAIN, LUMBAR [M54.5] 10/12/2007  . HELICOBACTER PYLORI GASTRITIS [A04.8] 09/09/2007  . GASTRIC ULCER, ACUTE, HEMORRHAGE [K25.0] 09/09/2007  . HIATAL HERNIA [K44.9] 09/09/2007  . COLONIC POLYPS, ADENOMATOUS [D12.6] 08/19/2007  . INTERNAL HEMORRHOIDS [K64.8] 08/19/2007  . EXTERNAL HEMORRHOIDS WITHOUT MENTION COMP [K64.4] 08/07/2007  . Blood in stool [K92.1] 08/07/2007  . ABDOMINAL PAIN, LEFT LOWER QUADRANT [R10.32] 08/07/2007  . SINUSITIS, ACUTE [J01.90] 06/12/2007  . ABDOMINAL PAIN [R10.9] 06/12/2007  . DIABETES MELLITUS, TYPE II [E11.9]  03/19/2007  . GOUT [M10.9] 03/19/2007  . DISORDER, CIRCADIAN RHYTHM SLEEP, NONORGANIC [G47.20, F51.8] 03/19/2007  . Depression [F32.9] 03/19/2007  . OSTEOARTHROSIS, GENERALIZED, MULTIPLE SITES [M15.9] 03/19/2007   History of Present Illness:   Patient is a 55 year old married male who presented for Follow-up. He reported that he is Doing well. Patient reported that he has already stopped taking the Latuda since it has not been helping. Patient reported that he takes lithium as prescribed. His labs were done 6 months ago. Patient reported that he is compliant with his medications. He wants to go for swim today as it is hot outside. He takes Ambien on a when necessary basis. He denied having any mood symptoms at this time. He appeared calm and collected during the interview. He denied having any perceptual disturbances. We discussed about his medications at length. Patient reported that he is not having any acute symptoms at this time.     He appeared alert and cooperative during the interview.   Patient currently denied having any suicidal homicidal ideations or plans.   Past Medical History:  Past Medical History:  Diagnosis Date  . Anxiety   . Arthritis    bil knees  . Bipolar disorder ([place])   . Colon polyp Dec 2015   Dr Allen Norris  . Depression    dx 2004  . Diabetes mellitus without complication ([place])    type ll  controlled by weight & diet  . H/O hiatal hernia    had surgery for that 2012  [facility]  . Hypertension   . Hypothyroidism   . Sleep apnea    dx 1.5 yr ago...study @ [facility]  . Sleep  apnea    uses c pap    Past Surgical History:  Procedure Laterality Date  . APPENDECTOMY    . COLONOSCOPY  Dec 2015   Dr Allen Norris  . EXCISIONAL HEMORRHOIDECTOMY  01/02/15  . FOOT SURGERY     right foot 2012  . HERNIA REPAIR     Left inguinal  . HERNIA REPAIR     hiatal hernia at [place]  . MANDIBLE SURGERY     1983  . REPLACEMENT TOTAL KNEE BILATERAL     patient had knee replacement to  his right side in 2015 and right 2014  . ROTATOR CUFF REPAIR     x 3  on right shoulder  . SHOULDER ARTHROSCOPY WITH ROTATOR CUFF REPAIR AND SUBACROMIAL DECOMPRESSION Left 03/04/2013   Procedure: LEFT SHOULDER ARTHROSCOPY WITH ROTATOR CUFF REPAIR AND SUBACROMIAL DECOMPRESSION;  Surgeon: Marin Shutter, MD;  Location: [place];  Service: Orthopedics;  Laterality: Left;  . SHOULDER SURGERY     x 3 on left   Family History:  Family History  Problem Relation Age of Onset  . CAD Mother   . Arthritis Mother   . Angina Mother   . Diabetes Father   . Heart disease Father   . Colon polyps Father   . Heart attack Father   . Bipolar disorder Sister   . Asthma Sister   . Schizophrenia Sister   . Diabetes Maternal Grandmother   . Cancer Maternal Grandmother   . Heart attack Maternal Grandfather   . Stomach cancer Paternal Grandmother    Social History:   Social History   Social History  . Marital status: Married    Spouse name: N/A  . Number of children: N/A  . Years of education: N/A   Social History Main Topics  . Smoking status: Never Smoker  . Smokeless tobacco: Former Systems developer    Types: Chew    Quit date: 10/07/1989  . Alcohol use 3.6 - 4.8 oz/week    6 - 8 Cans of beer per week     Comment: 2 beers per day  . Drug use: No  . Sexual activity: Yes   Other Topics Concern  . None   Social History Narrative  . None   Additional Social History:  Married x 4 years. This is his second marrigae. Works at [employer] in [employer] division.  Both have children from the first marriages. Patient drinks alcohol on a regular basis. He reported that this is lite alcohol and he does not feel that it is affecting his medication.  Musculoskeletal: Strength & Muscle Tone: within normal limits Gait & Station: normal Patient leans: N/A  Psychiatric Specialty Exam: Anxiety  Symptoms include insomnia.    Insomnia     Review of Systems  Psychiatric/Behavioral: The patient has insomnia.     Blood  pressure (!) 137/91, pulse 69, temperature 98.8 F (37.1 C), temperature source Oral, weight 207 lb 12.8 oz (94.3 kg).Body mass index is 29.82 kg/m.  General Appearance: Casual  Eye Contact:  Fair  Speech:  Clear and Coherent and Normal Rate  Volume:  Normal  Mood:  Anxious  Affect:  Congruent  Thought Process:  Coherent and Goal Directed  Orientation:  Full (Time, Place, and Person)  Thought Content:  WDL  Suicidal Thoughts:  No  Homicidal Thoughts:  No  Memory:  Immediate;   Fair  Judgement:  Fair  Insight:  Fair  Psychomotor Activity:  Normal  Concentration:  Fair  Recall:  Fair  Fund of Knowledge:Fair  Language: Fair  Akathisia:  No  Handed:  Right  AIMS (if indicated):    Assets:  Communication Skills Desire for Improvement Housing Intimacy Physical Health Social Support  ADL's:  Intact  Cognition: WNL  Sleep:  7-8   Is the patient at risk to self?  No. Has the patient been a risk to self in the past 6 months?  No. Has the patient been a risk to self within the distant past?  No. Is the patient a risk to others?  No. Has the patient been a risk to others in the past 6 months?  No. Has the patient been a risk to others within the distant past?  No.  Allergies:   Allergies  Allergen Reactions  . Tequin [Gatifloxacin] Anaphylaxis and Rash  . Amoxicillin Hives   Current Medications: Current Outpatient Prescriptions  Medication Sig Dispense Refill  . amLODipine (NORVASC) 10 MG tablet Take 5 mg by mouth daily.    [name] buPROPion (WELLBUTRIN XL) 150 MG 24 hr tablet Take 1 tablet (150 mg total) by mouth daily. Samples given- pt has supply 30 tablet 2  . EPIPEN 2-PAK 0.3 MG/0.3ML SOAJ injection     . HYDROcodone-acetaminophen (NORCO/VICODIN) 5-325 MG tablet     . levothyroxine (SYNTHROID, LEVOTHROID) 25 MCG tablet Take 1 tablet (25 mcg total) by mouth daily before breakfast. 30 tablet 12  . lithium carbonate 300 MG capsule Take 2 capsules (600 mg total) by mouth 2  (two) times daily with a meal. 60 capsule 1  . loratadine (CLARITIN) 10 MG tablet Take 10 mg by mouth daily.    . meloxicam (MOBIC) 15 MG tablet Take by mouth.    . mometasone (NASONEX) 50 MCG/ACT nasal spray SHAKE LQ AND U 1 TO 2 SPRAYS IEN QD  6  . Multiple Vitamin (MULTIVITAMIN WITH MINERALS) TABS Take 1 tablet by mouth daily.    . Testosterone (ANDROGEL) 40.5 MG/2.5GM (1.62%) GEL Apply 4 pumps daily 2.5 g 5  . traMADol (ULTRAM) 50 MG tablet Take 50 mg by mouth every 6 (six) hours as needed.    . zolpidem (AMBIEN) 10 MG tablet Take 1 tablet (10 mg total) by mouth at bedtime as needed for sleep. 30 tablet 2   No current facility-administered medications for this visit.     Previous Psychotropic Medications: Lithium Wellbutrin Abilify prozac zoloft celexa Depakote seroquel- could not function risperdal pristiq Lamotrigine Latuda belsorma Trazodone   Substance Abuse History in the last 12 months:  Yes.    Consequences of Substance Abuse: Negative NA  Medical Decision Making:  Review of Psycho-Social Stressors (1) and Review and summation of old records (2)  Treatment Plan Summary: Medication management     Discussed with patient at length about his medications treatment risks benefits and alternatives.  Will continue on lithium carbonate 600 mg by mouth twice a day.  Continue  Wellbutrin XL 150 mg in the morning.  DC Latuda Continue Ambien 10 mg by mouth daily at bedtime when necessary Medication refilled  for the next 2 months.  Patient will follow up in 2 months or earlier depending on his symptoms   More than 50% of the time spent in psychoeducation, counseling and coordination of care.     This note was generated in part or whole with voice recognition software. Voice regonition is usually quite accurate but there are transcription errors that can and very often do occur. I apologize for any typographical errors that were not  detected and corrected.     Rainey Pines, MD    9/28/20172:46 PM

## 2016-07-09 ENCOUNTER — Encounter: Payer: Self-pay | Admitting: Family Medicine

## 2016-07-09 ENCOUNTER — Ambulatory Visit (INDEPENDENT_AMBULATORY_CARE_PROVIDER_SITE_OTHER): Payer: 59 | Admitting: Family Medicine

## 2016-07-09 VITALS — BP 122/68 | HR 62 | Temp 97.8°F | Resp 16 | Wt 210.0 lb

## 2016-07-09 DIAGNOSIS — R5383 Other fatigue: Secondary | ICD-10-CM | POA: Diagnosis not present

## 2016-07-09 DIAGNOSIS — E039 Hypothyroidism, unspecified: Secondary | ICD-10-CM

## 2016-07-09 DIAGNOSIS — E291 Testicular hypofunction: Secondary | ICD-10-CM | POA: Diagnosis not present

## 2016-07-09 NOTE — Patient Instructions (Signed)
Get labs done before 11:00 in the morning but does not have to be fasting.

## 2016-07-09 NOTE — Progress Notes (Signed)
Subjective:  HPI Pt is here for a follow up from his low Testosterone and his hypothyroidism.Started synthroid 25 mcg daily and increased androgel to 4 pumps daily. He reports that he feels some better but he is getting sleepy in the afternoon at work. He also says that he twisted his abdomin and felt a sharpe pain in his groin area. Unsure if it could be hernia, or coming from his back.   Prior to Admission medications   Medication Sig Start Date End Date Taking? Authorizing Provider  amLODipine (NORVASC) 10 MG tablet Take 5 mg by mouth daily.    Historical Provider, MD  buPROPion (WELLBUTRIN XL) 150 MG 24 hr tablet Take 1 tablet (150 mg total) by mouth daily. Samples given- pt has supply 07/04/16   Rainey Pines, MD  EPIPEN 2-PAK 0.3 MG/0.3ML SOAJ injection  11/07/15   Historical Provider, MD  HYDROcodone-acetaminophen (NORCO/VICODIN) 5-325 MG tablet  12/28/15   Historical Provider, MD  levothyroxine (SYNTHROID, LEVOTHROID) 25 MCG tablet Take 1 tablet (25 mcg total) by mouth daily before breakfast. 05/09/16   Jerrol Banana., MD  lithium carbonate 300 MG capsule Take 2 capsules (600 mg total) by mouth 2 (two) times daily with a meal. 07/04/16   Rainey Pines, MD  loratadine (CLARITIN) 10 MG tablet Take 10 mg by mouth daily.    Historical Provider, MD  meloxicam (MOBIC) 15 MG tablet Take by mouth. 11/16/13   Historical Provider, MD  mometasone (NASONEX) 50 MCG/ACT nasal spray SHAKE LQ AND U 1 TO 2 SPRAYS IEN QD 11/07/15   Historical Provider, MD  Multiple Vitamin (MULTIVITAMIN WITH MINERALS) TABS Take 1 tablet by mouth daily.    Historical Provider, MD  Testosterone (ANDROGEL) 40.5 MG/2.5GM (1.62%) GEL Apply 4 pumps daily 05/09/16   Jerrol Banana., MD  traMADol (ULTRAM) 50 MG tablet Take 50 mg by mouth every 6 (six) hours as needed.    Historical Provider, MD  zolpidem (AMBIEN) 10 MG tablet Take 1 tablet (10 mg total) by mouth at bedtime as needed for sleep. 07/04/16   Rainey Pines, MD     Patient Active Problem List   Diagnosis Date Noted  . Chest pain 03/05/2016  . Pedal edema 03/08/2015  . History of repair of rotator cuff 02/17/2014  . Diabetes ([place]) 08/31/2013  . BP (high blood pressure) 08/31/2013  . Adult hypothyroidism 08/31/2013  . Obstructive apnea 08/31/2013  . Clinical depression 08/31/2013  . Diabetes mellitus ([place]) 08/31/2013  . Idiopathic localized osteoarthropathy 05/31/2013  . ABDOMINAL PAIN, LEFT UPPER QUADRANT 10/04/2009  . TARSAL TUNNEL SYNDROME, RIGHT 09/25/2009  . ANKLE PAIN, RIGHT 09/25/2009  . OTHER ANKLE SPRAIN AND STRAIN 09/25/2009  . BREAST MASS, RIGHT 01/07/2008  . INSOMNIA 01/07/2008  . GASTRIC ULCER, ACUTE 11/09/2007  . DUODENITIS 11/09/2007  . BLOOD IN STOOL, OCCULT 11/09/2007  . PERSONAL HISTORY OF COLONIC POLYPS 11/09/2007  . BACK PAIN, LUMBAR 10/12/2007  . HELICOBACTER PYLORI GASTRITIS 09/09/2007  . GASTRIC ULCER, ACUTE, HEMORRHAGE 09/09/2007  . HIATAL HERNIA 09/09/2007  . COLONIC POLYPS, ADENOMATOUS 08/19/2007  . INTERNAL HEMORRHOIDS 08/19/2007  . EXTERNAL HEMORRHOIDS WITHOUT MENTION COMP 08/07/2007  . Blood in stool 08/07/2007  . ABDOMINAL PAIN, LEFT LOWER QUADRANT 08/07/2007  . SINUSITIS, ACUTE 06/12/2007  . ABDOMINAL PAIN 06/12/2007  . DIABETES MELLITUS, TYPE II 03/19/2007  . GOUT 03/19/2007  . DISORDER, CIRCADIAN RHYTHM SLEEP, NONORGANIC 03/19/2007  . Depression 03/19/2007  . OSTEOARTHROSIS, GENERALIZED, MULTIPLE SITES 03/19/2007    Past Medical History:  Diagnosis Date  . Anxiety   . Arthritis    bil knees  . Bipolar disorder ([place])   . Colon polyp Dec 2015   Dr Allen Norris  . Depression    dx 2004  . Diabetes mellitus without complication ([place])    type ll  controlled by weight & diet  . H/O hiatal hernia    had surgery for that 2012  [facility]  . Hypertension   . Hypothyroidism   . Sleep apnea    dx 1.5 yr ago...study @ [place]  . Sleep apnea    uses c pap    Social History   Social History  .  Marital status: Married    Spouse name: N/A  . Number of children: N/A  . Years of education: N/A   Occupational History  . Not on file.   Social History Main Topics  . Smoking status: Never Smoker  . Smokeless tobacco: Former Systems developer    Types: Chew    Quit date: 10/07/1989  . Alcohol use 3.6 - 4.8 oz/week    6 - 8 Cans of beer per week     Comment: 2 beers per day  . Drug use: No  . Sexual activity: Yes   Other Topics Concern  . Not on file   Social History Narrative  . No narrative on file    Allergies  Allergen Reactions  . Tequin [Gatifloxacin] Anaphylaxis and Rash  . Amoxicillin Hives    Review of Systems  Constitutional: Positive for malaise/fatigue.  HENT: Negative.   Eyes: Negative.   Respiratory: Negative.   Cardiovascular: Negative.   Gastrointestinal: Negative.   Genitourinary: Negative.        Groin pain   Musculoskeletal: Negative.   Skin: Negative.   Neurological: Negative.   Endo/Heme/Allergies: Negative.   Psychiatric/Behavioral: Negative.     Immunization History  Administered Date(s) Administered  . Td 02/22/2003  . Tdap 07/02/2011   Objective:  BP 122/68 (BP Location: Left Arm, Patient Position: Sitting, Cuff Size: Large)   Pulse 62   Temp 97.8 F (36.6 C) (Oral)   Resp 16   Wt 210 lb (95.3 kg)   BMI 30.13 kg/m   Physical Exam  Constitutional: He is oriented to person, place, and time and well-developed, well-nourished, and in no distress.  HENT:  Head: Normocephalic and atraumatic.  Eyes: Conjunctivae and EOM are normal. Pupils are equal, round, and reactive to light.  Neck: Normal range of motion. Neck supple.  Cardiovascular: Normal rate, regular rhythm, normal heart sounds and intact distal pulses.   Pulmonary/Chest: Effort normal and breath sounds normal.  Abdominal: Soft.  Musculoskeletal: Normal range of motion.  Neurological: He is alert and oriented to person, place, and time. He has normal reflexes. Gait normal. GCS  score is 15.  Skin: Skin is warm and dry.  Psychiatric: Mood, memory, affect and judgment normal.    Lab Results  Component Value Date   WBC 6.1 05/07/2016   HGB 14.5 12/14/2014   HCT 45.4 05/07/2016   PLT 207 05/07/2016   GLUCOSE 171 (H) 05/07/2016   CHOL 135 07/12/2015   TRIG 54 07/12/2015   HDL 62 07/12/2015   LDLCALC 62 07/12/2015   TSH 4.570 (H) 05/07/2016   PSA 0.39 03/16/2007   HGBA1C 6.3 (H) 05/07/2016    CMP     Component Value Date/Time   NA 138 05/07/2016 0928   NA 141 10/27/2011 0010   K 5.0 05/07/2016 [id number]  K 5.1 10/27/2011 0010   CL 100 05/07/2016 0928   CL 103 10/27/2011 0010   CO2 22 05/07/2016 0928   CO2 25 10/27/2011 0010   GLUCOSE 171 (H) 05/07/2016 0928   GLUCOSE 78 03/02/2013 1007   GLUCOSE 135 (H) 10/27/2011 0010   BUN 16 05/07/2016 0928   BUN 19 (H) 10/27/2011 0010   CREATININE 1.08 05/07/2016 0928   CREATININE 0.98 10/27/2011 0010   CALCIUM 10.1 05/07/2016 0928   CALCIUM 8.5 10/27/2011 0010   PROT 7.5 05/07/2016 0928   PROT 7.0 10/27/2011 0010   ALBUMIN 5.0 05/07/2016 0928   ALBUMIN 3.6 10/27/2011 0010   AST 34 05/07/2016 0928   AST 62 (H) 10/27/2011 0010   ALT 53 (H) 05/07/2016 0928   ALT 33 10/27/2011 0010   ALKPHOS 55 05/07/2016 0928   ALKPHOS 31 (L) 10/27/2011 0010   BILITOT 1.0 05/07/2016 0928   BILITOT 0.6 10/27/2011 0010   GFRNONAA 76 05/07/2016 0928   GFRNONAA >60 10/27/2011 0010   GFRAA 88 05/07/2016 0928   GFRAA >60 10/27/2011 0010    Assessment and Plan :  1. Hypogonadism male  - Testosterone,Free and Total  2. Hypothyroidism, unspecified type  - TSH  3. Other fatigue 4.Bipolar Disorder May need new psychiatrist.  HPI, Exam, and A&P Transcribed under the direction and in the presence of Richard L. Cranford Mon, MD  Electronically Signed: Webb Laws, CMA I have done the exam and reviewed the above chart and it is accurate to the best of my knowledge.  Miguel Aschoff MD [facility] 07/09/2016 3:43 PM

## 2016-07-10 ENCOUNTER — Other Ambulatory Visit: Payer: Self-pay | Admitting: Psychiatry

## 2016-07-11 LAB — CBC WITH DIFFERENTIAL/PLATELET
Basophils Absolute: 0 10*3/uL (ref 0.0–0.2)
Basos: 1 %
EOS (ABSOLUTE): 0.2 10*3/uL (ref 0.0–0.4)
Eos: 5 %
Hematocrit: 47.1 % (ref 37.5–51.0)
Hemoglobin: 17.1 g/dL (ref 12.6–17.7)
Immature Grans (Abs): 0 10*3/uL (ref 0.0–0.1)
Immature Granulocytes: 0 %
Lymphocytes Absolute: 1 10*3/uL (ref 0.7–3.1)
Lymphs: 22 %
MCH: 31.8 pg (ref 26.6–33.0)
MCHC: 36.3 g/dL — ABNORMAL HIGH (ref 31.5–35.7)
MCV: 88 fL (ref 79–97)
Monocytes Absolute: 0.4 10*3/uL (ref 0.1–0.9)
Monocytes: 9 %
Neutrophils Absolute: 2.8 10*3/uL (ref 1.4–7.0)
Neutrophils: 63 %
Platelets: 206 10*3/uL (ref 150–379)
RBC: 5.37 x10E6/uL (ref 4.14–5.80)
RDW: 13.2 % (ref 12.3–15.4)
WBC: 4.5 10*3/uL (ref 3.4–10.8)

## 2016-07-11 LAB — COMPREHENSIVE METABOLIC PANEL
ALT: 45 IU/L — ABNORMAL HIGH (ref 0–44)
AST: 36 IU/L (ref 0–40)
Albumin/Globulin Ratio: 1.8 (ref 1.2–2.2)
Albumin: 4.9 g/dL (ref 3.5–5.5)
Alkaline Phosphatase: 58 IU/L (ref 39–117)
BUN/Creatinine Ratio: 13 (ref 9–20)
BUN: 13 mg/dL (ref 6–24)
Bilirubin Total: 1.1 mg/dL (ref 0.0–1.2)
CO2: 23 mmol/L (ref 18–29)
Calcium: 9.7 mg/dL (ref 8.7–10.2)
Chloride: 105 mmol/L (ref 96–106)
Creatinine, Ser: 1.01 mg/dL (ref 0.76–1.27)
GFR calc Af Amer: 95 mL/min/{1.73_m2} (ref >59)
GFR calc non Af Amer: 82 mL/min/{1.73_m2} (ref >59)
Globulin, Total: 2.7 g/dL (ref 1.5–4.5)
Glucose: 187 mg/dL — ABNORMAL HIGH (ref 65–99)
Potassium: 5.2 mmol/L (ref 3.5–5.2)
Sodium: 142 mmol/L (ref 134–144)
Total Protein: 7.6 g/dL (ref 6.0–8.5)

## 2016-07-11 LAB — LITHIUM LEVEL: Lithium Lvl: 0.4 mmol/L — ABNORMAL LOW (ref 0.6–1.2)

## 2016-07-11 LAB — TSH: TSH: 2.24 u[IU]/mL (ref 0.450–4.500)

## 2016-07-12 ENCOUNTER — Other Ambulatory Visit: Payer: Self-pay | Admitting: Emergency Medicine

## 2016-07-12 DIAGNOSIS — E291 Testicular hypofunction: Secondary | ICD-10-CM

## 2016-07-12 LAB — TESTOSTERONE,FREE AND TOTAL
Testosterone, Free: 4 pg/mL — ABNORMAL LOW (ref 7.2–24.0)
Testosterone: 152 ng/dL — ABNORMAL LOW (ref 264–916)

## 2016-07-12 LAB — TSH: TSH: 2.32 u[IU]/mL (ref 0.450–4.500)

## 2016-07-15 ENCOUNTER — Ambulatory Visit (INDEPENDENT_AMBULATORY_CARE_PROVIDER_SITE_OTHER): Payer: 59 | Admitting: Family Medicine

## 2016-07-15 ENCOUNTER — Encounter: Payer: Self-pay | Admitting: Family Medicine

## 2016-07-15 VITALS — BP 120/76 | HR 64 | Temp 98.1°F | Resp 16 | Wt 205.0 lb

## 2016-07-15 DIAGNOSIS — R05 Cough: Secondary | ICD-10-CM

## 2016-07-15 DIAGNOSIS — J0100 Acute maxillary sinusitis, unspecified: Secondary | ICD-10-CM

## 2016-07-15 DIAGNOSIS — R059 Cough, unspecified: Secondary | ICD-10-CM

## 2016-07-15 MED ORDER — DOXYCYCLINE HYCLATE 100 MG PO TABS: 100.0000 mg | ORAL_TABLET | Freq: Two times a day (BID) | ORAL | 0 refills | Status: DC

## 2016-07-15 MED ORDER — HYDROCODONE-HOMATROPINE 5-1.5 MG/5ML PO SYRP: 5.0000 mL | ORAL_SOLUTION | Freq: Four times a day (QID) | ORAL | 0 refills | Status: DC | PRN

## 2016-07-15 NOTE — Progress Notes (Signed)
Subjective:  HPI Pt reports that he started feeling bad last week but really hit him this past Friday. Cough (yellow sputum), congestion, sore throat, chills, sinus pain and pressure around his eyes. He denies any fevers, shortness of breath or body aches. Wife has been sick as well.   Prior to Admission medications   Medication Sig Start Date End Date Taking? Authorizing Provider  amLODipine (NORVASC) 10 MG tablet Take 5 mg by mouth daily.    Historical Provider, MD  buPROPion (WELLBUTRIN XL) 150 MG 24 hr tablet Take 1 tablet (150 mg total) by mouth daily. Samples given- pt has supply 07/04/16   Rainey Pines, MD  EPIPEN 2-PAK 0.3 MG/0.3ML SOAJ injection  11/07/15   Historical Provider, MD  HYDROcodone-acetaminophen (NORCO/VICODIN) 5-325 MG tablet  12/28/15   Historical Provider, MD  levothyroxine (SYNTHROID, LEVOTHROID) 25 MCG tablet Take 1 tablet (25 mcg total) by mouth daily before breakfast. 05/09/16   Jerrol Banana., MD  lithium carbonate 300 MG capsule Take 2 capsules (600 mg total) by mouth 2 (two) times daily with a meal. 07/04/16   Rainey Pines, MD  loratadine (CLARITIN) 10 MG tablet Take 10 mg by mouth daily.    Historical Provider, MD  meloxicam (MOBIC) 15 MG tablet Take by mouth. 11/16/13   Historical Provider, MD  mometasone (NASONEX) 50 MCG/ACT nasal spray SHAKE LQ AND U 1 TO 2 SPRAYS IEN QD 11/07/15   Historical Provider, MD  Multiple Vitamin (MULTIVITAMIN WITH MINERALS) TABS Take 1 tablet by mouth daily.    Historical Provider, MD  Testosterone (ANDROGEL) 40.5 MG/2.5GM (1.62%) GEL Apply 4 pumps daily 05/09/16   Jerrol Banana., MD  traMADol (ULTRAM) 50 MG tablet Take 50 mg by mouth every 6 (six) hours as needed.    Historical Provider, MD  zolpidem (AMBIEN) 10 MG tablet Take 1 tablet (10 mg total) by mouth at bedtime as needed for sleep. 07/04/16   Rainey Pines, MD    Patient Active Problem List   Diagnosis Date Noted  . Chest pain 03/05/2016  . Pedal edema 03/08/2015  .  History of repair of rotator cuff 02/17/2014  . Diabetes ([place]) 08/31/2013  . BP (high blood pressure) 08/31/2013  . Adult hypothyroidism 08/31/2013  . Obstructive apnea 08/31/2013  . Clinical depression 08/31/2013  . Diabetes mellitus ([place]) 08/31/2013  . Idiopathic localized osteoarthropathy 05/31/2013  . ABDOMINAL PAIN, LEFT UPPER QUADRANT 10/04/2009  . TARSAL TUNNEL SYNDROME, RIGHT 09/25/2009  . ANKLE PAIN, RIGHT 09/25/2009  . OTHER ANKLE SPRAIN AND STRAIN 09/25/2009  . BREAST MASS, RIGHT 01/07/2008  . INSOMNIA 01/07/2008  . GASTRIC ULCER, ACUTE 11/09/2007  . DUODENITIS 11/09/2007  . BLOOD IN STOOL, OCCULT 11/09/2007  . PERSONAL HISTORY OF COLONIC POLYPS 11/09/2007  . BACK PAIN, LUMBAR 10/12/2007  . HELICOBACTER PYLORI GASTRITIS 09/09/2007  . GASTRIC ULCER, ACUTE, HEMORRHAGE 09/09/2007  . HIATAL HERNIA 09/09/2007  . COLONIC POLYPS, ADENOMATOUS 08/19/2007  . INTERNAL HEMORRHOIDS 08/19/2007  . EXTERNAL HEMORRHOIDS WITHOUT MENTION COMP 08/07/2007  . Blood in stool 08/07/2007  . ABDOMINAL PAIN, LEFT LOWER QUADRANT 08/07/2007  . SINUSITIS, ACUTE 06/12/2007  . ABDOMINAL PAIN 06/12/2007  . DIABETES MELLITUS, TYPE II 03/19/2007  . GOUT 03/19/2007  . DISORDER, CIRCADIAN RHYTHM SLEEP, NONORGANIC 03/19/2007  . Depression 03/19/2007  . OSTEOARTHROSIS, GENERALIZED, MULTIPLE SITES 03/19/2007    Past Medical History:  Diagnosis Date  . Anxiety   . Arthritis    bil knees  . Bipolar disorder ([place])   . Colon  polyp Dec 2015   Dr Allen Norris  . Depression    dx 2004  . Diabetes mellitus without complication ([place])    type ll  controlled by weight & diet  . H/O hiatal hernia    had surgery for that 2012  [facility]  . Hypertension   . Hypothyroidism   . Sleep apnea    dx 1.5 yr ago...study @ [place]  . Sleep apnea    uses c pap    Social History   Social History  . Marital status: Married    Spouse name: N/A  . Number of children: N/A  . Years of education: N/A   Occupational  History  . Not on file.   Social History Main Topics  . Smoking status: Never Smoker  . Smokeless tobacco: Former Systems developer    Types: Chew    Quit date: 10/07/1989  . Alcohol use 3.6 - 4.8 oz/week    6 - 8 Cans of beer per week     Comment: 2 beers per day  . Drug use: No  . Sexual activity: Yes   Other Topics Concern  . Not on file   Social History Narrative  . No narrative on file    Allergies  Allergen Reactions  . Tequin [Gatifloxacin] Anaphylaxis and Rash  . Amoxicillin Hives    Review of Systems  Constitutional: Positive for malaise/fatigue.  HENT: Positive for congestion and sore throat.   Eyes: Negative.   Respiratory: Positive for cough and sputum production.   Cardiovascular: Negative.   Gastrointestinal: Negative.   Genitourinary: Negative.   Musculoskeletal: Positive for back pain.  Skin: Negative.   Neurological: Positive for headaches.  Endo/Heme/Allergies: Negative.   Psychiatric/Behavioral: Negative.     Immunization History  Administered Date(s) Administered  . Td 02/22/2003  . Tdap 07/02/2011   Objective:  BP 120/76 (BP Location: Left Arm, Patient Position: Sitting, Cuff Size: Normal)   Pulse 64   Temp 98.1 F (36.7 C) (Oral)   Resp 16   Wt 205 lb (93 kg)   SpO2 94%   BMI 29.41 kg/m   Physical Exam  Constitutional: He is oriented to person, place, and time and well-developed, well-nourished, and in no distress.  HENT:  Head: Normocephalic and atraumatic.  Right Ear: External ear normal.  Left Ear: External ear normal.  Nose: Nose normal.  Mouth/Throat: Oropharynx is clear and moist.  Maxillary sinus tenderness  Eyes: Conjunctivae and EOM are normal. Pupils are equal, round, and reactive to light.  Neck: Normal range of motion. Neck supple.  Cardiovascular: Normal rate, regular rhythm, normal heart sounds and intact distal pulses.   Pulmonary/Chest: Effort normal and breath sounds normal.  Abdominal: Soft.  Musculoskeletal: Normal  range of motion.  Neurological: He is alert and oriented to person, place, and time. He has normal reflexes. Gait normal. GCS score is 15.  Skin: Skin is warm and dry.  Psychiatric: Mood, memory, affect and judgment normal.    Lab Results  Component Value Date   WBC 4.5 07/10/2016   HGB 14.5 12/14/2014   HCT 47.1 07/10/2016   PLT 206 07/10/2016   GLUCOSE 187 (H) 07/10/2016   CHOL 135 07/12/2015   TRIG 54 07/12/2015   HDL 62 07/12/2015   LDLCALC 62 07/12/2015   TSH 2.240 07/10/2016   PSA 0.39 03/16/2007   HGBA1C 6.3 (H) 05/07/2016    CMP     Component Value Date/Time   NA 142 07/10/2016 0900   NA  141 10/27/2011 0010   K 5.2 07/10/2016 0900   K 5.1 10/27/2011 0010   CL 105 07/10/2016 0900   CL 103 10/27/2011 0010   CO2 23 07/10/2016 0900   CO2 25 10/27/2011 0010   GLUCOSE 187 (H) 07/10/2016 0900   GLUCOSE 78 03/02/2013 1007   GLUCOSE 135 (H) 10/27/2011 0010   BUN 13 07/10/2016 0900   BUN 19 (H) 10/27/2011 0010   CREATININE 1.01 07/10/2016 0900   CREATININE 0.98 10/27/2011 0010   CALCIUM 9.7 07/10/2016 0900   CALCIUM 8.5 10/27/2011 0010   PROT 7.6 07/10/2016 0900   PROT 7.0 10/27/2011 0010   ALBUMIN 4.9 07/10/2016 0900   ALBUMIN 3.6 10/27/2011 0010   AST 36 07/10/2016 0900   AST 62 (H) 10/27/2011 0010   ALT 45 (H) 07/10/2016 0900   ALT 33 10/27/2011 0010   ALKPHOS 58 07/10/2016 0900   ALKPHOS 31 (L) 10/27/2011 0010   BILITOT 1.1 07/10/2016 0900   BILITOT 0.6 10/27/2011 0010   GFRNONAA 82 07/10/2016 0900   GFRNONAA >60 10/27/2011 0010   GFRAA 95 07/10/2016 0900   GFRAA >60 10/27/2011 0010    Assessment and Plan :  1. Acute non-recurrent maxillary sinusitis  - doxycycline (VIBRA-TABS) 100 MG tablet; Take 1 tablet (100 mg total) by mouth 2 (two) times daily.  Dispense: 20 tablet; Refill: 0  2. Cough  - HYDROcodone-homatropine (HYCODAN) 5-1.5 MG/5ML syrup; Take 5 mLs by mouth every 6 (six) hours as needed for cough.  Dispense: 120 mL; Refill: 0   HPI,  Exam, and A&P Transcribed under the direction and in the presence of Richard L. Cranford Mon, MD  Electronically Signed: Webb Laws, [place] MD [facility] 07/15/2016 11:18 AM

## 2016-07-15 NOTE — Patient Instructions (Signed)
Try Netipot rinses and saline nasal spray

## 2016-07-23 ENCOUNTER — Ambulatory Visit (INDEPENDENT_AMBULATORY_CARE_PROVIDER_SITE_OTHER): Payer: 59 | Admitting: Urology

## 2016-07-23 ENCOUNTER — Encounter: Payer: Self-pay | Admitting: Urology

## 2016-07-23 VITALS — BP 123/79 | HR 60 | Ht 70.0 in | Wt 208.2 lb

## 2016-07-23 DIAGNOSIS — N529 Male erectile dysfunction, unspecified: Secondary | ICD-10-CM

## 2016-07-23 DIAGNOSIS — N138 Other obstructive and reflux uropathy: Secondary | ICD-10-CM

## 2016-07-23 DIAGNOSIS — N400 Enlarged prostate without lower urinary tract symptoms: Secondary | ICD-10-CM | POA: Diagnosis not present

## 2016-07-23 DIAGNOSIS — E291 Testicular hypofunction: Secondary | ICD-10-CM

## 2016-07-23 DIAGNOSIS — N401 Enlarged prostate with lower urinary tract symptoms: Secondary | ICD-10-CM

## 2016-07-23 LAB — BLADDER SCAN AMB NON-IMAGING: Scan Result: 37

## 2016-07-23 MED ORDER — TESTOSTERONE 5.5 MG/ACT NA GEL: 1.0000 | Freq: Two times a day (BID) | NASAL | 5 refills | Status: DC

## 2016-07-23 NOTE — Progress Notes (Signed)
07/23/2016 3:48 PM   Jeremiah [name] 11/03/1960 [id number]  Referring provider: Jerrol Banana., MD [address] [place], [place] [address]  Chief Complaint  Patient presents with  . New Patient (Initial Visit)    Hypogonadism referred by [name]    HPI: Patient is a 55 year old Caucasian male who is referred by [name] for hypogonadism.  Hypogonadism Patient is experiencing a decrease in libido, a lack of energy, a decrease in strength, a loss in height, a decreased enjoyment in life,  sadness and/or grumpiness, erections being less strong, falling asleep after dinner and a recent deterioration in their work performance.  This is indicated by his responses to the ADAM questionnaire.  He is no longer having spontaneous erections at night.  He has sleep apnea and is sleeping with a CPAP machine.  His pretreatment testosterone level was 208 ng/dL on 05/07/2016 and 152 ng/dL on 07/10/2016.  He had been  managing his hypogonadism with testosterone cypionate, Androderm and AndroGel.   He did not like the unsteady levels of the shots and his levels were not therapeutic with the topicals.  His prolactin level was 14.8 ng/mL on 10/06/2015.       Androgen Deficiency in the Aging Male    [name] 07/23/16 1400         Androgen Deficiency in the Aging Male   Do you have a decrease in libido (sex drive) Yes     Do you have lack of energy Yes     Do you have a decrease in strength and/or endurance Yes     Have you lost height Yes     Have you noticed a decreased "enjoyment of life" Yes     Are you sad and/or grumpy Yes     Are your erections less strong Yes     Have you noticed a recent deterioration in your ability to play sports No     Are you falling asleep after dinner Yes     Has there been a recent deterioration in your work performance Yes       BPH WITH LUTS His IPSS score today is 16, which is moderate lower urinary tract symptomatology. He is mixed  with his quality life due to his urinary symptoms.  His PVR is 37 mL.   His major complaint today nocturia x 4.  He has had these symptoms for the last few years.  He denies any dysuria, hematuria or suprapubic pain.   He also denies any recent fevers, chills, nausea or vomiting.  He does not have a family history of PCa.     IPSS    Row Name 07/23/16 1400         International Prostate Symptom Score   How often have you had the sensation of not emptying your bladder? Less than half the time     How often have you had to urinate less than every two hours? About half the time     How often have you found you stopped and started again several times when you urinated? Less than 1 in 5 times     How often have you found it difficult to postpone urination? More than half the time     How often have you had a weak urinary stream? Less than 1 in 5 times     How often have you had to strain to start urination? Less than 1 in 5 times  How many times did you typically get up at night to urinate? 4 Times     Total IPSS Score 16       Quality of Life due to urinary symptoms   If you were to spend the rest of your life with your urinary condition just the way it is now how would you feel about that? Mixed        Score:  1-7 Mild 8-19 Moderate 20-35 Severe  Erectile dysfunction His SHIM score is 7, which is severe ED.   He has been having difficulty with erections for four to five years.   His major complaint is no or little firmness with his ED.   His libido is diminished.   His risk factors for ED are age, BPH, hypogonadism, DM, HTN, hypothyroidism, sleep apnea, stress, anxiety, depression, antidepressants and blood pressure medications.   He denies any painful erections or curvatures with his erections.      SHIM    Row Name 07/23/16 1441         SHIM: Over the last 6 months:   How do you rate your confidence that you could get and keep an erection? Very Low     When you had erections  with sexual stimulation, how often were your erections hard enough for penetration (entering your partner)? Almost Never or Never     During sexual intercourse, how often were you able to maintain your erection after you had penetrated (entered) your partner? Extremely Difficult     During sexual intercourse, how difficult was it to maintain your erection to completion of intercourse? Extremely Difficult     When you attempted sexual intercourse, how often was it satisfactory for you? Difficult       SHIM Total Score   SHIM 7        Score: 1-7 Severe ED 8-11 Moderate ED 12-16 Mild-Moderate ED 17-21 Mild ED 22-25 No ED   PMH: Past Medical History:  Diagnosis Date  . Anxiety   . Arthritis    bil knees  . Bipolar disorder ([place])   . Colon polyp Dec 2015   Dr Allen Norris  . Depression    dx 2004  . Diabetes mellitus without complication ([place])    type ll  controlled by weight & diet  . Gout   . H/O hiatal hernia    had surgery for that 2012  [facility]  . Heartburn   . Hypertension   . Hypothyroidism   . Sleep apnea    dx 1.5 yr ago...study @ [place]  . Sleep apnea    uses c pap    Surgical History: Past Surgical History:  Procedure Laterality Date  . APPENDECTOMY    . COLONOSCOPY  Dec 2015   Dr Allen Norris  . EXCISIONAL HEMORRHOIDECTOMY  01/02/15  . FOOT SURGERY     right foot 2012  . HERNIA REPAIR     Left inguinal  . HERNIA REPAIR     hiatal hernia at [place]  . MANDIBLE SURGERY     1983  . REPLACEMENT TOTAL KNEE BILATERAL     patient had knee replacement to his right side in 2015 and right 2014  . ROTATOR CUFF REPAIR     x 3  on right shoulder  . SHOULDER ARTHROSCOPY WITH ROTATOR CUFF REPAIR AND SUBACROMIAL DECOMPRESSION Left 03/04/2013   Procedure: LEFT SHOULDER ARTHROSCOPY WITH ROTATOR CUFF REPAIR AND SUBACROMIAL DECOMPRESSION;  Surgeon: Marin Shutter, MD;  Location: [facility];  Service: Orthopedics;  Laterality: Left;  . SHOULDER SURGERY     x 3 on left    Home  Medications:    Medication List       Accurate as of 07/23/16  3:48 PM. Always use your most recent med list.          amLODipine 10 MG tablet Commonly known as:  NORVASC Take 5 mg by mouth daily.   buPROPion 150 MG 24 hr tablet Commonly known as:  WELLBUTRIN XL Take 1 tablet (150 mg total) by mouth daily. Samples given- pt has supply   doxycycline 100 MG tablet Commonly known as:  VIBRA-TABS Take 1 tablet (100 mg total) by mouth 2 (two) times daily.   EPIPEN 2-PAK 0.3 mg/0.3 mL Soaj injection Generic drug:  EPINEPHrine   HYDROcodone-acetaminophen 5-325 MG tablet Commonly known as:  NORCO/VICODIN   HYDROcodone-homatropine 5-1.5 MG/5ML syrup Commonly known as:  HYCODAN Take 5 mLs by mouth every 6 (six) hours as needed for cough.   levothyroxine 25 MCG tablet Commonly known as:  SYNTHROID, LEVOTHROID Take 1 tablet (25 mcg total) by mouth daily before breakfast.   lithium carbonate 300 MG capsule Take 2 capsules (600 mg total) by mouth 2 (two) times daily with a meal.   loratadine 10 MG tablet Commonly known as:  CLARITIN Take 10 mg by mouth daily.   meloxicam 15 MG tablet Commonly known as:  MOBIC Take by mouth.   mometasone 50 MCG/ACT nasal spray Commonly known as:  NASONEX SHAKE LQ AND U 1 TO 2 SPRAYS IEN QD   multivitamin with minerals Tabs tablet Take 1 tablet by mouth daily.   Testosterone 5.5 MG/ACT Gel Commonly known as:  NATESTO Place 1 Act into the nose 2 (two) times daily.   traMADol 50 MG tablet Commonly known as:  ULTRAM Take 50 mg by mouth every 6 (six) hours as needed.   zolpidem 10 MG tablet Commonly known as:  AMBIEN Take 1 tablet (10 mg total) by mouth at bedtime as needed for sleep.       Allergies:  Allergies  Allergen Reactions  . Tequin [Gatifloxacin] Anaphylaxis and Rash  . Amoxicillin Hives    Family History: Family History  Problem Relation Age of Onset  . CAD Mother   . Arthritis Mother   . Angina Mother   .  Diabetes Father   . Heart disease Father   . Colon polyps Father   . Heart attack Father   . Bipolar disorder Sister   . Asthma Sister   . Schizophrenia Sister   . Diabetes Maternal Grandmother   . Cancer Maternal Grandmother   . Heart attack Maternal Grandfather   . Stomach cancer Paternal Grandmother   . Kidney disease Neg Hx   . Prostate cancer Neg Hx     Social History:  reports that he has never smoked. He quit smokeless tobacco use about 26 years ago. His smokeless tobacco use included Chew. He reports that he drinks about 3.6 - 4.8 oz of alcohol per week . He reports that he does not use drugs.  ROS: UROLOGY Frequent Urination?: No Hard to postpone urination?: No Burning/pain with urination?: No Get up at night to urinate?: Yes Leakage of urine?: Yes Urine stream starts and stops?: No Trouble starting stream?: No Do you have to strain to urinate?: No Blood in urine?: No Urinary tract infection?: No Sexually transmitted disease?: No Injury to kidneys or bladder?: No Painful intercourse?: No Weak stream?: Yes Erection  problems?: Yes Penile pain?: No  Gastrointestinal Nausea?: No Vomiting?: No Indigestion/heartburn?: No Diarrhea?: No Constipation?: No  Constitutional Fever: No Night sweats?: No Weight loss?: No Fatigue?: No  Skin Skin rash/lesions?: No Itching?: No  Eyes Blurred vision?: No Double vision?: No  Ears/Nose/Throat Sore throat?: No Sinus problems?: Yes  Hematologic/Lymphatic Swollen glands?: No Easy bruising?: No  Cardiovascular Leg swelling?: No Chest pain?: No  Respiratory Cough?: Yes Shortness of breath?: No  Endocrine Excessive thirst?: No  Musculoskeletal Back pain?: Yes Joint pain?: Yes  Neurological Headaches?: Yes Dizziness?: No  Psychologic Depression?: Yes Anxiety?: Yes  Physical Exam: BP 123/79   Pulse 60   Ht 5\' 10"  (1.778 m)   Wt 208 lb 3.2 oz (94.4 kg)   BMI 29.87 kg/m   Constitutional: Well  nourished. Alert and oriented, No acute distress. HEENT: [place] AT, moist mucus membranes. Trachea midline, no masses. Cardiovascular: No clubbing, cyanosis, or edema. Respiratory: Normal respiratory effort, no increased work of breathing. GI: Abdomen is soft, non tender, non distended, no abdominal masses. Liver and spleen not palpable.  No hernias appreciated.  Stool sample for occult testing is not indicated.   GU: No CVA tenderness.  No bladder fullness or masses.  Patient with circumcised phallus.   Urethral meatus is patent.  No penile discharge. No penile lesions or rashes. Scrotum without lesions, cysts, rashes and/or edema.  Testicles are located scrotally bilaterally. No masses are appreciated in the testicles. Left and right epididymis are normal. Rectal: Patient with  normal sphincter tone. Anus and perineum without scarring or rashes. No rectal masses are appreciated. Prostate is approximately 50 grams, no nodules are appreciated. Seminal vesicles are normal. Skin: No rashes, bruises or suspicious lesions. Lymph: No cervical or inguinal adenopathy. Neurologic: Grossly intact, no focal deficits, moving all 4 extremities. Psychiatric: Normal mood and affect.  Laboratory Data: Lab Results  Component Value Date   WBC 4.5 07/10/2016   HGB 14.5 12/14/2014   HCT 47.1 07/10/2016   MCV 88 07/10/2016   PLT 206 07/10/2016    Lab Results  Component Value Date   CREATININE 1.01 07/10/2016    Lab Results  Component Value Date   PSA 0.39 03/16/2007    Lab Results  Component Value Date   TESTOSTERONE 152 (L) 07/10/2016    Lab Results  Component Value Date   HGBA1C 6.3 (H) 05/07/2016    Lab Results  Component Value Date   TSH 2.240 07/10/2016       Component Value Date/Time   CHOL 135 07/12/2015 1028   HDL 62 07/12/2015 1028   CHOLHDL 3.8 CALC 03/16/2007 1007   VLDL 26 03/16/2007 1007   LDLCALC 62 07/12/2015 1028    Lab Results  Component Value Date   AST 36  07/10/2016   Lab Results  Component Value Date   ALT 45 (H) 07/10/2016    Pertinent Imaging: Results for ABDIMALIK, [place] (MRN [id number]) as of 07/29/2016 20:21  Ref. Range 07/23/2016 15:47  Scan Result Unknown 37    Assessment & Plan:    1. Hypogonadism  - I explained to patient that the current recommendations from the Endocrine Society reports the diagnosis of hypogonadism requires a serum total testosterone level obtained between 8 and 10 AM at least 2 days apart that is below the laboratory parameters  for normal testosterone.     - At this time, the patient does meet this requirement.    -I discussed with the patient the side effects of testosterone  therapy, such as: enlargement of the prostate gland that may in turn cause LUTS, possible increased risk of PCa, DVT's and/or PE's, possible increased risk of heart attack or stroke, lower sperm count, swelling of the ankles, feet, or body, with or without heart failure, enlarged or painful breasts, have problems breathing while you sleep (sleep apnea), increased prostate specific antigen, mood swings, hypertension and increased red blood cell count.  - Failed testosterone cypionate, Androderm and AndroGel  - Not interested in Testopel  - Not a candidate for Clomid due to elevated ALT  - Will try Nastesto  - RTC in one month for serum testosterone draw  2. BPH with LUTS  - IPSS score is 16/3  - Continue conservative management, avoiding bladder irritants and timed voiding's  - RTC in 6 months for IPSS, PSA, PVR and exam, as testosterone therapy can increase prostate size and worsen LUTS  - PSA  3. Erectile dysfunction  - SHIM score is 7   - I explained to the patient that in order to achieve an erection it takes good functioning of the nervous system (parasympathetic, sympathetic, sensory and motor), good blood flow into the erectile tissue of the penis and a desire to have sex  - I explained that conditions like diabetes,  hypertension, coronary artery disease, peripheral vascular disease, smoking, alcohol consumption, age, sleep apnea and BPH can diminish the ability to have an erection  - RTC in 6 months for repeat SHIM score and exam, as testosterone therapy can affect erections   Return in about 1 month (around 08/23/2016) for testosterone level does not need to be a morning draw.  These notes generated with voice recognition software. I apologize for typographical errors.  Zara Council, [facility] [address], [place], [place] [address] [phone]

## 2016-07-24 LAB — PSA: Prostate Specific Ag, Serum: 0.6 ng/mL (ref 0.0–4.0)

## 2016-07-31 ENCOUNTER — Other Ambulatory Visit: Payer: Self-pay | Admitting: Psychiatry

## 2016-07-31 NOTE — Telephone Encounter (Signed)
refilled 

## 2016-08-07 ENCOUNTER — Telehealth: Payer: Self-pay | Admitting: Family Medicine

## 2016-08-07 DIAGNOSIS — R05 Cough: Secondary | ICD-10-CM

## 2016-08-07 DIAGNOSIS — R059 Cough, unspecified: Secondary | ICD-10-CM

## 2016-08-07 MED ORDER — HYDROCODONE-HOMATROPINE 5-1.5 MG/5ML PO SYRP: 5.0000 mL | ORAL_SOLUTION | Freq: Four times a day (QID) | ORAL | 0 refills | Status: DC | PRN

## 2016-08-07 NOTE — Telephone Encounter (Signed)
Pt was advised his rx was ready for pick up.  teri

## 2016-08-07 NOTE — Telephone Encounter (Signed)
rx ready for pick up. Can not be called in. Tried to call pt but VM was full unable to leave message. thanks

## 2016-08-07 NOTE — Telephone Encounter (Signed)
Pt contacted office for refill request on the following medications:  HYDROcodone-homatropine (HYCODAN) 5-1.5 MG/5ML syrup.  [id number]

## 2016-08-07 NOTE — Telephone Encounter (Signed)
Please advise for refill on mediation. Pt was seen on 07/15/16 for sinusitis and cough.

## 2016-08-07 NOTE — Telephone Encounter (Signed)
1 rf ok 

## 2016-08-07 NOTE — Telephone Encounter (Signed)
lov was 07/23/16 for sinusitis and cough, please review-aa

## 2016-08-23 ENCOUNTER — Other Ambulatory Visit: Payer: Self-pay

## 2016-08-26 ENCOUNTER — Other Ambulatory Visit: Payer: 59

## 2016-08-26 ENCOUNTER — Other Ambulatory Visit: Payer: Self-pay

## 2016-08-26 DIAGNOSIS — E291 Testicular hypofunction: Secondary | ICD-10-CM

## 2016-08-27 ENCOUNTER — Telehealth: Payer: Self-pay

## 2016-08-27 LAB — TESTOSTERONE: Testosterone: 172 ng/dL — ABNORMAL LOW (ref 264–916)

## 2016-08-27 NOTE — Telephone Encounter (Signed)
-----   Message from Nori Riis, PA-C sent at 08/27/2016 12:44 PM EST ----- Is patient using Natesto?

## 2016-08-27 NOTE — Telephone Encounter (Signed)
Mailbox full

## 2016-08-28 NOTE — Telephone Encounter (Signed)
No answer. Mailbox full. 

## 2016-09-01 ENCOUNTER — Other Ambulatory Visit: Payer: Self-pay | Admitting: Psychiatry

## 2016-09-02 NOTE — Telephone Encounter (Signed)
No answer. Will send a letter.  

## 2016-09-03 ENCOUNTER — Encounter: Payer: Self-pay | Admitting: Psychiatry

## 2016-09-03 ENCOUNTER — Telehealth: Payer: Self-pay

## 2016-09-03 ENCOUNTER — Ambulatory Visit (INDEPENDENT_AMBULATORY_CARE_PROVIDER_SITE_OTHER): Payer: 59 | Admitting: Psychiatry

## 2016-09-03 VITALS — BP 132/85 | HR 64 | Temp 97.6°F | Resp 16 | Wt 212.4 lb

## 2016-09-03 DIAGNOSIS — F319 Bipolar disorder, unspecified: Secondary | ICD-10-CM

## 2016-09-03 DIAGNOSIS — F3130 Bipolar disorder, current episode depressed, mild or moderate severity, unspecified: Secondary | ICD-10-CM

## 2016-09-03 MED ORDER — BUPROPION HCL ER (XL) 150 MG PO TB24: 150.0000 mg | ORAL_TABLET | Freq: Every day | ORAL | 2 refills | Status: DC

## 2016-09-03 MED ORDER — LITHIUM CARBONATE 300 MG PO CAPS: 600.0000 mg | ORAL_CAPSULE | Freq: Two times a day (BID) | ORAL | 1 refills | Status: DC

## 2016-09-03 NOTE — Progress Notes (Signed)
Psychiatric MD Progress Note   Patient Identification: Jeremiah [name] MRN:  [id number] Date of Evaluation:  09/03/2016 Referral Source: Dr. Bridgett Larsson Chief Complaint:   Chief Complaint    Follow-up     Visit Diagnosis:    ICD-9-CM ICD-10-CM   1. Bipolar I disorder, most recent episode depressed ([place]) 296.50 F31.30   2. Alcohol dependence   303.93 F10.21    Diagnosis:   Patient Active Problem List   Diagnosis Date Noted  . Chest pain [R07.9] 03/05/2016  . Pedal edema [R60.0] 03/08/2015  . History of repair of rotator cuff [Z98.890] 02/17/2014  . Diabetes (Jeremiah [name]) [E11.9] 08/31/2013  . BP (high blood pressure) [I10] 08/31/2013  . Adult hypothyroidism [E03.9] 08/31/2013  . Obstructive apnea [G47.33] 08/31/2013  . Clinical depression [F32.9] 08/31/2013  . Diabetes mellitus (Jeremiah [name]) [E11.9] 08/31/2013  . Idiopathic localized osteoarthropathy [M19.91] 05/31/2013  . ABDOMINAL PAIN, LEFT UPPER QUADRANT [R10.12] 10/04/2009  . TARSAL TUNNEL SYNDROME, RIGHT [G57.50] 09/25/2009  . ANKLE PAIN, RIGHT [M25.579] 09/25/2009  . OTHER ANKLE SPRAIN AND STRAIN [id number] 09/25/2009  . BREAST MASS, RIGHT [N63.0] 01/07/2008  . INSOMNIA [G47.00] 01/07/2008  . GASTRIC ULCER, ACUTE [K25.3] 11/09/2007  . DUODENITIS [K29.80] 11/09/2007  . BLOOD IN STOOL, OCCULT [R19.5] 11/09/2007  . PERSONAL HISTORY OF COLONIC POLYPS [Z86.010] 11/09/2007  . BACK PAIN, LUMBAR [M54.5] 10/12/2007  . HELICOBACTER PYLORI GASTRITIS [A04.8] 09/09/2007  . GASTRIC ULCER, ACUTE, HEMORRHAGE [K25.0] 09/09/2007  . HIATAL HERNIA [K44.9] 09/09/2007  . COLONIC POLYPS, ADENOMATOUS [D12.6] 08/19/2007  . INTERNAL HEMORRHOIDS [K64.8] 08/19/2007  . EXTERNAL HEMORRHOIDS WITHOUT MENTION COMP [K64.4] 08/07/2007  . Blood in stool [K92.1] 08/07/2007  . ABDOMINAL PAIN, LEFT LOWER QUADRANT [R10.32] 08/07/2007  . SINUSITIS, ACUTE [J01.90] 06/12/2007  . ABDOMINAL PAIN [R10.9] 06/12/2007  . DIABETES MELLITUS, TYPE II [E11.9] 03/19/2007  .  GOUT [M10.9] 03/19/2007  . DISORDER, CIRCADIAN RHYTHM SLEEP, NONORGANIC [G47.20, F51.8] 03/19/2007  . Depression [F32.9] 03/19/2007  . OSTEOARTHROSIS, GENERALIZED, MULTIPLE SITES [M15.9] 03/19/2007   History of Present Illness:   Patient is a 55 year old married male who presented for Follow-up. He reported that he is Doing well. He went to [place] for the Thanksgiving to see his father. He reported that it was a nice trip. Patient reported that he has been compliant with his medications. He had his lithium level done in October but patient reported that he stopped the lithium at that time and his level was low. However he has been compliant with the medication and he is going to have his complete physical exam done next week. Patient reported that he will have his labs . He lost his prescription from the last appointment and has not been taking Ambien for the past 6 months. He is sleeping well without the medication. He reported that he is taking the natesto and is not having any side effects. He appeared calm and alert during the interview. He currently denied having any suicidal homicidal ideations or plans. He denied having any perceptual disturbances at this time.    He appeared calm and collected during the interview. He denied having any perceptual disturbances. We discussed about his medications at length. Patient reported that he is not having any acute symptoms at this time.   Past Medical History:  Past Medical History:  Diagnosis Date  . Anxiety   . Arthritis    bil knees  . Bipolar disorder ([name])   . Colon polyp Dec 2015   Dr Allen Norris  . Depression    dx 2004  .  Diabetes mellitus without complication (Jeremiah [name])    type ll  controlled by weight & diet  . Gout   . H/O hiatal hernia    had surgery for that 2012  [facility]  . Heartburn   . Hypertension   . Hypothyroidism   . Sleep apnea    dx 1.5 yr ago...study @ [place]  . Sleep apnea    uses c pap    Past Surgical History:   Procedure Laterality Date  . APPENDECTOMY    . COLONOSCOPY  Dec 2015   Dr Allen Norris  . EXCISIONAL HEMORRHOIDECTOMY  01/02/15  . FOOT SURGERY     right foot 2012  . HERNIA REPAIR     Left inguinal  . HERNIA REPAIR     hiatal hernia at [place]  . MANDIBLE SURGERY     1983  . REPLACEMENT TOTAL KNEE BILATERAL     patient had knee replacement to his right side in 2015 and right 2014  . ROTATOR CUFF REPAIR     x 3  on right shoulder  . SHOULDER ARTHROSCOPY WITH ROTATOR CUFF REPAIR AND SUBACROMIAL DECOMPRESSION Left 03/04/2013   Procedure: LEFT SHOULDER ARTHROSCOPY WITH ROTATOR CUFF REPAIR AND SUBACROMIAL DECOMPRESSION;  Surgeon: Marin Shutter, MD;  Location: [place];  Service: Orthopedics;  Laterality: Left;  . SHOULDER SURGERY     x 3 on left   Family History:  Family History  Problem Relation Age of Onset  . CAD Mother   . Arthritis Mother   . Angina Mother   . Diabetes Father   . Heart disease Father   . Colon polyps Father   . Heart attack Father   . Bipolar disorder Sister   . Asthma Sister   . Schizophrenia Sister   . Diabetes Maternal Grandmother   . Cancer Maternal Grandmother   . Heart attack Maternal Grandfather   . Stomach cancer Paternal Grandmother   . Kidney disease Neg Hx   . Prostate cancer Neg Hx    Social History:   Social History   Social History  . Marital status: Married    Spouse name: N/A  . Number of children: N/A  . Years of education: N/A   Social History Main Topics  . Smoking status: Never Smoker  . Smokeless tobacco: Former Systems developer    Types: Chew    Quit date: 10/07/1989  . Alcohol use 3.6 - 4.8 oz/week    6 - 8 Cans of beer per week     Comment: 2 beers per day  . Drug use: No  . Sexual activity: Yes   Other Topics Concern  . Not on file   Social History Narrative  . No narrative on file   Additional Social History:  Married x 4 years. This is his second marrigae. Works at [employer] in [employer] division.  Both have children from the first  marriages. Patient drinks alcohol on a regular basis. He reported that this is lite alcohol and he does not feel that it is affecting his medication.  Musculoskeletal: Strength & Muscle Tone: within normal limits Gait & Station: normal Patient leans: N/A  Psychiatric Specialty Exam: Anxiety  Symptoms include insomnia.    Insomnia     Review of Systems  Psychiatric/Behavioral: The patient has insomnia.     There were no vitals taken for this visit.There is no height or weight on file to calculate BMI.  General Appearance: Casual  Eye Contact:  Fair  Speech:  Clear  and Coherent and Normal Rate  Volume:  Normal  Mood:  Anxious  Affect:  Congruent  Thought Process:  Coherent and Goal Directed  Orientation:  Full (Time, Place, and Person)  Thought Content:  WDL  Suicidal Thoughts:  No  Homicidal Thoughts:  No  Memory:  Immediate;   Fair  Judgement:  Fair  Insight:  Fair  Psychomotor Activity:  Normal  Concentration:  Fair  Recall:  [employer] [place]  Language: Fair  Akathisia:  No  Handed:  Right  AIMS (if indicated):    Assets:  Communication Skills Desire for Improvement Housing Intimacy Physical Health Social Support  ADL's:  Intact  Cognition: WNL  Sleep:  7-8   Is the patient at risk to self?  No. Has the patient been a risk to self in the past 6 months?  No. Has the patient been a risk to self within the distant past?  No. Is the patient a risk to others?  No. Has the patient been a risk to others in the past 6 months?  No. Has the patient been a risk to others within the distant past?  No.  Allergies:   Allergies  Allergen Reactions  . Tequin [Gatifloxacin] Anaphylaxis and Rash  . Amoxicillin Hives   Current Medications: Current Outpatient Prescriptions  Medication Sig Dispense Refill  . amLODipine (NORVASC) 10 MG tablet Take 5 mg by mouth daily.    [name] buPROPion (WELLBUTRIN XL) 150 MG 24 hr tablet Take 1 tablet (150 mg total) by mouth  daily. Samples given- pt has supply 30 tablet 2  . EPIPEN 2-PAK 0.3 MG/0.3ML SOAJ injection     . HYDROcodone-acetaminophen (NORCO/VICODIN) 5-325 MG tablet     . HYDROcodone-homatropine (HYCODAN) 5-1.5 MG/5ML syrup Take 5 mLs by mouth every 6 (six) hours as needed for cough. 120 mL 0  . levothyroxine (SYNTHROID, LEVOTHROID) 25 MCG tablet Take 1 tablet (25 mcg total) by mouth daily before breakfast. 30 tablet 12  . lithium carbonate 300 MG capsule TAKE 2 CAPSULES BY MOUTH TWICE DAILY WITH A MEAL 60 capsule 0  . loratadine (CLARITIN) 10 MG tablet Take 10 mg by mouth daily.    . meloxicam (MOBIC) 15 MG tablet Take by mouth.    . mometasone (NASONEX) 50 MCG/ACT nasal spray SHAKE LQ AND U 1 TO 2 SPRAYS IEN QD  6  . Multiple Vitamin (MULTIVITAMIN WITH MINERALS) TABS Take 1 tablet by mouth daily.    . Testosterone (NATESTO) 5.5 MG/ACT GEL Place 1 Act into the nose 2 (two) times daily. 7.32 g 5  . traMADol (ULTRAM) 50 MG tablet Take 50 mg by mouth every 6 (six) hours as needed.    . zolpidem (AMBIEN) 10 MG tablet Take 1 tablet (10 mg total) by mouth at bedtime as needed for sleep. 30 tablet 2  . doxycycline (VIBRA-TABS) 100 MG tablet Take 1 tablet (100 mg total) by mouth 2 (two) times daily. (Patient not taking: Reported on 09/03/2016) 20 tablet 0   No current facility-administered medications for this visit.     Previous Psychotropic Medications: Lithium Wellbutrin Abilify prozac zoloft celexa Depakote seroquel- could not function risperdal pristiq Lamotrigine Latuda belsorma Trazodone   Substance Abuse History in the last 12 months:  Yes.    Consequences of Substance Abuse: Negative NA  Medical Decision Making:  Review of Psycho-Social Stressors (1) and Review and summation of old records (2)  Treatment Plan Summary: Medication management     Discussed with patient  at length about his medications treatment risks benefits and alternatives.  Will continue on lithium  carbonate 600 mg by mouth twice a day.  Continue  Wellbutrin XL 150 mg in the morning.  Medication refilled  for the next 2 months.  Patient will follow up in 2 months or earlier depending on his symptoms   More than 50% of the time spent in psychoeducation, counseling and coordination of care.     This note was generated in part or whole with voice recognition software. Voice regonition is usually quite accurate but there are transcription errors that can and very often do occur. I apologize for any typographical errors that were not detected and corrected.    Rainey Pines, MD    11/28/20173:51 PM

## 2016-09-03 NOTE — Telephone Encounter (Signed)
Mailbox full

## 2016-09-03 NOTE — Telephone Encounter (Signed)
He may increase the dose to one pump in each nostril three times a day.

## 2016-09-03 NOTE — Telephone Encounter (Signed)
Spoke with pt in reference to natesto. Pt stated that he is using 2 pumps each nostril bid. Please advise.

## 2016-09-04 ENCOUNTER — Telehealth: Payer: Self-pay | Admitting: *Deleted

## 2016-09-04 NOTE — Telephone Encounter (Signed)
Spoke with pt in reference to increasing natesto dose. Pt voiced understanding.

## 2016-09-04 NOTE — Telephone Encounter (Signed)
Mailbox full

## 2016-09-11 ENCOUNTER — Inpatient Hospital Stay: Admit: 2016-09-11 | Payer: Self-pay

## 2016-09-11 ENCOUNTER — Encounter: Payer: Self-pay | Admitting: Family Medicine

## 2016-09-11 ENCOUNTER — Ambulatory Visit (INDEPENDENT_AMBULATORY_CARE_PROVIDER_SITE_OTHER): Payer: 59 | Admitting: Family Medicine

## 2016-09-11 VITALS — BP 120/82 | HR 70 | Temp 97.8°F | Resp 16 | Ht 70.0 in | Wt 210.0 lb

## 2016-09-11 DIAGNOSIS — F3130 Bipolar disorder, current episode depressed, mild or moderate severity, unspecified: Secondary | ICD-10-CM | POA: Diagnosis not present

## 2016-09-11 DIAGNOSIS — F319 Bipolar disorder, unspecified: Secondary | ICD-10-CM

## 2016-09-11 DIAGNOSIS — M542 Cervicalgia: Secondary | ICD-10-CM

## 2016-09-11 DIAGNOSIS — E119 Type 2 diabetes mellitus without complications: Secondary | ICD-10-CM | POA: Diagnosis not present

## 2016-09-11 DIAGNOSIS — E039 Hypothyroidism, unspecified: Secondary | ICD-10-CM | POA: Diagnosis not present

## 2016-09-11 DIAGNOSIS — Z0000 Encounter for general adult medical examination without abnormal findings: Secondary | ICD-10-CM | POA: Diagnosis not present

## 2016-09-11 LAB — POCT URINALYSIS DIPSTICK
Bilirubin, UA: NEGATIVE
Blood, UA: NEGATIVE
Glucose, UA: NEGATIVE
Ketones, UA: NEGATIVE
Leukocytes, UA: NEGATIVE
Nitrite, UA: NEGATIVE
Protein, UA: NEGATIVE
Spec Grav, UA: 1.01
Urobilinogen, UA: 0.2
pH, UA: 7

## 2016-09-11 MED ORDER — CYCLOBENZAPRINE HCL 10 MG PO TABS: 10.0000 mg | ORAL_TABLET | Freq: Three times a day (TID) | ORAL | 5 refills | Status: DC | PRN

## 2016-09-11 NOTE — Progress Notes (Signed)
Patient: Jeremiah [name], Male    DOB: 1961/01/16, 55 y.o.   MRN: [id number] Visit Date: 09/11/2016  Today's Provider: [name], MD   Chief Complaint  Patient presents with  . Annual Exam   Subjective:    Annual physical exam Jeremiah [name] is a 55 y.o. male who presents today for health maintenance and complete physical. He feels well. He reports exercising occasionally. He reports he is sleeping well. He is married for the second time and has 1 child and 2 stepchildre along with 1 3yo granddaughter. Pt wears CPAP nightly.  ----------------------------------------------------------------- Colonoscopy- 09/19/14 Polyps, hemorrhoids repeat 3 years Had flu vaccine through employer.   Immunization History  Administered Date(s) Administered  . Td 02/22/2003  . Tdap 07/02/2011     Review of Systems  Constitutional: Negative.   HENT: Positive for ear discharge (clear dried fluid when he wakes up in the mornings.) and tinnitus.   Respiratory: Positive for apnea.   Cardiovascular: Negative.   Gastrointestinal: Negative.   Endocrine: Negative.   Genitourinary: Negative.   Musculoskeletal: Positive for arthralgias, back pain, neck pain and neck stiffness.       He has years of left shoulder pain and decreasing function and for 2 months he has had mild torticollis.  Skin: Negative.   Allergic/Immunologic: Positive for environmental allergies.  Neurological: Negative.   Hematological: Negative.   Psychiatric/Behavioral: Negative.     Social History      He  reports that he has never smoked. He quit smokeless tobacco use about 26 years ago. His smokeless tobacco use included Chew. He reports that he drinks about 3.6 - 4.8 oz of alcohol per week . He reports that he does not use drugs.       Social History   Social History  . Marital status: Married    Spouse name: N/A  . Number of children: N/A  . Years of education: N/A   Social History Main Topics  .  Smoking status: Never Smoker  . Smokeless tobacco: Former Systems developer    Types: Chew    Quit date: 10/07/1989  . Alcohol use 3.6 - 4.8 oz/week    6 - 8 Cans of beer per week     Comment: 2 beers per day  . Drug use: No  . Sexual activity: Yes   Other Topics Concern  . None   Social History Narrative  . None    Past Medical History:  Diagnosis Date  . Anxiety   . Arthritis    bil knees  . Bipolar disorder ([place])   . Colon polyp Dec 2015   Dr Allen Norris  . Depression    dx 2004  . Diabetes mellitus without complication ([place])    type ll  controlled by weight & diet  . Gout   . H/O hiatal hernia    had surgery for that 2012  [facility]  . Heartburn   . Hypertension   . Hypothyroidism   . Sleep apnea    dx 1.5 yr ago...study @ [place]  . Sleep apnea    uses c pap     Patient Active Problem List   Diagnosis Date Noted  . Chest pain 03/05/2016  . Pedal edema 03/08/2015  . History of repair of rotator cuff 02/17/2014  . Diabetes ([place]) 08/31/2013  . BP (high blood pressure) 08/31/2013  . Adult hypothyroidism 08/31/2013  . Obstructive apnea 08/31/2013  . Clinical depression 08/31/2013  .  Diabetes mellitus ([place]) 08/31/2013  . Idiopathic localized osteoarthropathy 05/31/2013  . ABDOMINAL PAIN, LEFT UPPER QUADRANT 10/04/2009  . TARSAL TUNNEL SYNDROME, RIGHT 09/25/2009  . ANKLE PAIN, RIGHT 09/25/2009  . OTHER ANKLE SPRAIN AND STRAIN 09/25/2009  . BREAST MASS, RIGHT 01/07/2008  . INSOMNIA 01/07/2008  . GASTRIC ULCER, ACUTE 11/09/2007  . DUODENITIS 11/09/2007  . BLOOD IN STOOL, OCCULT 11/09/2007  . PERSONAL HISTORY OF COLONIC POLYPS 11/09/2007  . BACK PAIN, LUMBAR 10/12/2007  . HELICOBACTER PYLORI GASTRITIS 09/09/2007  . GASTRIC ULCER, ACUTE, HEMORRHAGE 09/09/2007  . HIATAL HERNIA 09/09/2007  . COLONIC POLYPS, ADENOMATOUS 08/19/2007  . INTERNAL HEMORRHOIDS 08/19/2007  . EXTERNAL HEMORRHOIDS WITHOUT MENTION COMP 08/07/2007  . Blood in stool 08/07/2007  . ABDOMINAL PAIN, LEFT  LOWER QUADRANT 08/07/2007  . SINUSITIS, ACUTE 06/12/2007  . ABDOMINAL PAIN 06/12/2007  . DIABETES MELLITUS, TYPE II 03/19/2007  . GOUT 03/19/2007  . DISORDER, CIRCADIAN RHYTHM SLEEP, NONORGANIC 03/19/2007  . Depression 03/19/2007  . OSTEOARTHROSIS, GENERALIZED, MULTIPLE SITES 03/19/2007    Past Surgical History:  Procedure Laterality Date  . APPENDECTOMY    . COLONOSCOPY  Dec 2015   Dr Allen Norris  . EXCISIONAL HEMORRHOIDECTOMY  01/02/15  . FOOT SURGERY     right foot 2012  . HERNIA REPAIR     Left inguinal  . HERNIA REPAIR     hiatal hernia at [place]  . MANDIBLE SURGERY     1983  . REPLACEMENT TOTAL KNEE BILATERAL     patient had knee replacement to his right side in 2015 and right 2014  . ROTATOR CUFF REPAIR     x 3  on right shoulder  . SHOULDER ARTHROSCOPY WITH ROTATOR CUFF REPAIR AND SUBACROMIAL DECOMPRESSION Left 03/04/2013   Procedure: LEFT SHOULDER ARTHROSCOPY WITH ROTATOR CUFF REPAIR AND SUBACROMIAL DECOMPRESSION;  Surgeon: Marin Shutter, MD;  Location: [place];  Service: Orthopedics;  Laterality: Left;  . SHOULDER SURGERY     x 3 on left    Family History        Family Status  Relation Status  . Mother Alive  . Father Alive  . Sister Alive  . Maternal Grandmother Deceased  . Maternal Grandfather Deceased  . Paternal Grandmother Deceased  . Son Alive  . Paternal Grandfather Deceased  . Neg Hx         His family history includes Angina in his mother; Arthritis in his mother; Asthma in his sister; Bipolar disorder in his sister; CAD in his mother; Cancer in his maternal grandmother; Colon polyps in his father; Diabetes in his father and maternal grandmother; Heart attack in his father and maternal grandfather; Heart disease in his father; Schizophrenia in his sister; Stomach cancer in his paternal grandmother.     Allergies  Allergen Reactions  . Tequin [Gatifloxacin] Anaphylaxis and Rash  . Amoxicillin Hives     Current Outpatient Prescriptions:  .  amLODipine  (NORVASC) 10 MG tablet, Take 5 mg by mouth daily., Disp: , Rfl:  .  buPROPion (WELLBUTRIN XL) 150 MG 24 hr tablet, Take 1 tablet (150 mg total) by mouth daily. Samples given- pt has supply, Disp: 30 tablet, Rfl: 2 .  EPIPEN 2-PAK 0.3 MG/0.3ML SOAJ injection, , Disp: , Rfl:  .  HYDROcodone-acetaminophen (NORCO/VICODIN) 5-325 MG tablet, , Disp: , Rfl:  .  levothyroxine (SYNTHROID, LEVOTHROID) 25 MCG tablet, Take 1 tablet (25 mcg total) by mouth daily before breakfast., Disp: 30 tablet, Rfl: 12 .  lithium carbonate 300 MG capsule, Take 2 capsules (  600 mg total) by mouth 2 (two) times daily with a meal., Disp: 120 capsule, Rfl: 1 .  loratadine (CLARITIN) 10 MG tablet, Take 10 mg by mouth daily., Disp: , Rfl:  .  meloxicam (MOBIC) 15 MG tablet, Take by mouth., Disp: , Rfl:  .  mometasone (NASONEX) 50 MCG/ACT nasal spray, SHAKE LQ AND U 1 TO 2 SPRAYS IEN QD, Disp: , Rfl: 6 .  Multiple Vitamin (MULTIVITAMIN WITH MINERALS) TABS, Take 1 tablet by mouth daily., Disp: , Rfl:  .  Testosterone (NATESTO) 5.5 MG/ACT GEL, Place 1 Act into the nose 2 (two) times daily., Disp: 7.32 g, Rfl: 5 .  traMADol (ULTRAM) 50 MG tablet, Take 50 mg by mouth every 6 (six) hours as needed., Disp: , Rfl:  .  traMADol (ULTRAM) 50 MG tablet, TAKE 1 TABLET BY MOUTH EVERY 8 HOURS AS NEEDED FOR PAIN, Disp: , Rfl:    Patient Care Team: Jerrol Banana., MD as PCP - General (Unknown Physician Specialty) Jerrol Banana., MD (Family Medicine) Seeplaputhur Robinette Haines, MD (General Surgery)      Objective:   Vitals: BP 120/82 (BP Location: Left Arm, Patient Position: Sitting, Cuff Size: Normal)   Pulse 70   Temp 97.8 F (36.6 C) (Oral)   Resp 16   Ht 5\' 10"  (1.778 m)   Wt 210 lb (95.3 kg)   BMI 30.13 kg/m    Physical Exam  Constitutional: He is oriented to person, place, and time. He appears well-developed and well-nourished.  HENT:  Head: Normocephalic and atraumatic.  Right Ear: External ear normal.  Left Ear:  External ear normal.  Nose: Nose normal.  Mouth/Throat: Oropharynx is clear and moist.  Eyes: Conjunctivae and EOM are normal. Pupils are equal, round, and reactive to light.  Neck: Normal range of motion. Neck supple.  Cardiovascular: Normal rate, regular rhythm, normal heart sounds and intact distal pulses.   Pulmonary/Chest: Effort normal and breath sounds normal.  Abdominal: Soft. Bowel sounds are normal.  Genitourinary: Rectum normal, prostate normal and penis normal.  Musculoskeletal: Normal range of motion.  Pt unable to abduct left shoulder beyond about 45 edgrees.  Neurological: He is alert and oriented to person, place, and time. He has normal reflexes.  Skin: Skin is warm and dry.  Psychiatric: He has a normal mood and affect. His behavior is normal. Judgment and thought content normal.     Depression Screen No flowsheet data found.    Assessment & Plan:     Routine Health Maintenance and Physical Exam  Exercise Activities and Dietary recommendations Goals    None      Immunization History  Administered Date(s) Administered  . Td 02/22/2003  . Tdap 07/02/2011    Health Maintenance  Topic Date Due  . Hepatitis C Screening  16-Jul-1961  . PNEUMOCOCCAL POLYSACCHARIDE VACCINE (1) 11/29/1960  . FOOT EXAM  11/29/1968  . OPHTHALMOLOGY EXAM  11/29/1968  . URINE MICROALBUMIN  11/29/1968  . HIV Screening  11/29/1973  . INFLUENZA VACCINE  09/20/2016 (Originally 05/07/2016)  . HEMOGLOBIN A1C  11/07/2016  . COLONOSCOPY  09/20/2017  . TETANUS/TDAP  07/01/2021    Diet and exercise discussed. Discussed health benefits of physical activity, and encouraged him to engage in regular exercise appropriate for his age and condition.   Bipolar Depression In remission per psychiatry. OSA Pt uses CPAP nightly. Left Shoulder Arthropathy Mild Torticollis HTN AR Hypoganadism   --------------------------------------------------------------------  I have done the exam and  reviewed the above  chart and it is accurate to the best of my knowledge. Development worker, community has been used in this note in any air is in the dictation or transcription are unintentional.   [name], MD  [place]

## 2016-09-12 LAB — LIPID PANEL WITH LDL/HDL RATIO
Cholesterol, Total: 115 mg/dL (ref 100–199)
HDL: 52 mg/dL (ref >39)
LDL Calculated: 49 mg/dL (ref 0–99)
LDl/HDL Ratio: 0.9 ratio units (ref 0.0–3.6)
Triglycerides: 70 mg/dL (ref 0–149)
VLDL Cholesterol Cal: 14 mg/dL (ref 5–40)

## 2016-09-12 LAB — HEMOGLOBIN A1C
Est. average glucose Bld gHb Est-mCnc: 146 mg/dL
Hgb A1c MFr Bld: 6.7 % — ABNORMAL HIGH (ref 4.8–5.6)

## 2016-09-12 LAB — LITHIUM LEVEL: Lithium Lvl: 1.2 mmol/L (ref 0.6–1.2)

## 2016-09-12 LAB — TSH: TSH: 2.68 u[IU]/mL (ref 0.450–4.500)

## 2016-09-17 ENCOUNTER — Ambulatory Visit
Admission: RE | Admit: 2016-09-17 | Discharge: 2016-09-17 | Disposition: A | Discharge status: Home / Self Care | Payer: 59 | Source: Ambulatory Visit | Attending: Family Medicine | Admitting: Family Medicine

## 2016-09-17 DIAGNOSIS — M542 Cervicalgia: Secondary | ICD-10-CM | POA: Diagnosis present

## 2016-09-17 DIAGNOSIS — M5030 Other cervical disc degeneration, unspecified cervical region: Secondary | ICD-10-CM | POA: Diagnosis not present

## 2016-09-18 ENCOUNTER — Telehealth: Payer: Self-pay | Admitting: Urology

## 2016-09-18 DIAGNOSIS — E291 Testicular hypofunction: Secondary | ICD-10-CM

## 2016-09-18 MED ORDER — TESTOSTERONE 5.5 MG/ACT NA GEL: 1.0000 | Freq: Two times a day (BID) | NASAL | 5 refills | Status: DC

## 2016-09-18 NOTE — Telephone Encounter (Signed)
Try the [facility].

## 2016-09-18 NOTE — Telephone Encounter (Signed)
Are you aware of this? Should we try the natesto pharmacy the rep recommended?

## 2016-09-18 NOTE — Telephone Encounter (Signed)
Pt went to pharmacy to pick up [facility] and was told they don't have it anymore and quit making it.  Please give pt a call.  He wants to know if there is anything else he could take.

## 2016-09-18 NOTE — Telephone Encounter (Signed)
Rx sent to [facility] pharmacy.

## 2016-10-10 DIAGNOSIS — J301 Allergic rhinitis due to pollen: Secondary | ICD-10-CM | POA: Diagnosis not present

## 2016-10-10 DIAGNOSIS — J3089 Other allergic rhinitis: Secondary | ICD-10-CM | POA: Diagnosis not present

## 2016-10-15 DIAGNOSIS — J3089 Other allergic rhinitis: Secondary | ICD-10-CM | POA: Diagnosis not present

## 2016-10-15 DIAGNOSIS — R9431 Abnormal electrocardiogram [ECG] [EKG]: Secondary | ICD-10-CM | POA: Diagnosis not present

## 2016-10-15 DIAGNOSIS — Q248 Other specified congenital malformations of heart: Secondary | ICD-10-CM | POA: Diagnosis not present

## 2016-10-15 DIAGNOSIS — Z01812 Encounter for preprocedural laboratory examination: Secondary | ICD-10-CM | POA: Diagnosis not present

## 2016-10-15 DIAGNOSIS — I517 Cardiomegaly: Secondary | ICD-10-CM | POA: Diagnosis not present

## 2016-10-15 DIAGNOSIS — Z01818 Encounter for other preprocedural examination: Secondary | ICD-10-CM | POA: Diagnosis not present

## 2016-10-15 DIAGNOSIS — J301 Allergic rhinitis due to pollen: Secondary | ICD-10-CM | POA: Diagnosis not present

## 2016-10-16 ENCOUNTER — Ambulatory Visit: Payer: 59 | Admitting: Psychiatry

## 2016-10-16 DIAGNOSIS — Z01812 Encounter for preprocedural laboratory examination: Secondary | ICD-10-CM | POA: Diagnosis not present

## 2016-10-18 ENCOUNTER — Ambulatory Visit (INDEPENDENT_AMBULATORY_CARE_PROVIDER_SITE_OTHER): Payer: 59 | Admitting: Family Medicine

## 2016-10-18 ENCOUNTER — Encounter: Payer: Self-pay | Admitting: Family Medicine

## 2016-10-18 VITALS — BP 116/80 | HR 100 | Temp 99.5°F | Resp 17 | Wt 210.2 lb

## 2016-10-18 DIAGNOSIS — J111 Influenza due to unidentified influenza virus with other respiratory manifestations: Secondary | ICD-10-CM

## 2016-10-18 MED ORDER — HYDROCODONE-HOMATROPINE 5-1.5 MG/5ML PO SYRP: ORAL_SOLUTION | ORAL | 0 refills | Status: DC

## 2016-10-18 NOTE — Patient Instructions (Signed)
Discussed use of Mucinex D for congestion. Let me know if you need a work excuse extension next week.[name]

## 2016-10-18 NOTE — Progress Notes (Signed)
Subjective:     Patient ID: Jeremiah [name], male   DOB: [birth year], 56 y.o.   MRN: [id number]  HPI  Chief Complaint  Patient presents with  . Cough    Patient comes in office today with concerns of cough for one week. Patient complains that for the past 24hrs he has had body ache, chills, sinus pain below the eyes, sore throat and fatigue. Patient states that he has been taking otc Delysm and Dayquil Flu/Cough  States he had at tickle in this throat provoking cough for a few days until the last 24 hours when he developed flu like symptoms. + flu shot.   Review of Systems     Objective:   Physical Exam  Constitutional: He appears well-developed and well-nourished. No distress.  Ears: T.M's intact without inflammation Throat:mild tonsillar enlargement but no exudate Neck: no cervical adenopathy Lungs: clear     Assessment:    1. Flu - HYDROcodone-homatropine (HYCODAN) 5-1.5 MG/5ML syrup; 5 ml 4-6 hours as needed for cough  Dispense: 240 mL; Refill: 0    Plan:    Discussed use of Mucinex D. Work excuse for today.

## 2016-10-21 ENCOUNTER — Telehealth: Payer: Self-pay

## 2016-10-21 NOTE — Telephone Encounter (Signed)
Patient called and states that he saw you on 10/18/16 and he feels better but has a dry cough, sore throat and neck glands feel swollen and tender. No other symptoms. He is wondering if he needs to come back in for re check or what is your advice-aa

## 2016-10-21 NOTE — Telephone Encounter (Signed)
May return to work if fever has been gone for 24 hours and his energy has returned. Would continue to wear the mask if coughing frequently.

## 2016-10-21 NOTE — Telephone Encounter (Signed)
Continue cough syrup as needed. Call me at the end of the week on how he is doing.

## 2016-10-21 NOTE — Telephone Encounter (Signed)
Patient was advised he would like to know is it okay for him to return back to work? KW

## 2016-10-21 NOTE — Telephone Encounter (Signed)
Pt advised-aa 

## 2016-10-25 ENCOUNTER — Encounter: Payer: Self-pay | Admitting: Family Medicine

## 2016-10-25 ENCOUNTER — Ambulatory Visit (INDEPENDENT_AMBULATORY_CARE_PROVIDER_SITE_OTHER): Payer: 59 | Admitting: Family Medicine

## 2016-10-25 VITALS — BP 94/68 | HR 66 | Temp 97.5°F | Resp 16 | Wt 203.6 lb

## 2016-10-25 DIAGNOSIS — L0100 Impetigo, unspecified: Secondary | ICD-10-CM | POA: Diagnosis not present

## 2016-10-25 DIAGNOSIS — J039 Acute tonsillitis, unspecified: Secondary | ICD-10-CM

## 2016-10-25 LAB — POCT RAPID STREP A (OFFICE): Rapid Strep A Screen: POSITIVE — AB

## 2016-10-25 MED ORDER — CLINDAMYCIN HCL 300 MG PO CAPS: 300.0000 mg | ORAL_CAPSULE | Freq: Three times a day (TID) | ORAL | 0 refills | Status: DC

## 2016-10-25 NOTE — Patient Instructions (Signed)
Discussed use of antibiotic ointment on your nose 2-3 x day.

## 2016-10-25 NOTE — Progress Notes (Signed)
Subjective:     Patient ID: Jeremiah [name], male   DOB: Mar 02, 1961, 56 y.o.   MRN: [id number]  HPI  Chief Complaint  Patient presents with  . Follow-up    Ptient returns to office today for follow up visit, patient was last seen 10/18/16 and prescribed Hycodan 5-1.5mg /43mL. Patient reports that there is no change in cough but states the Hycodan does help at night. Patient reports sore throat, difficulty swallowing, pain on right side of neck and swelling of lymph node, runny nose and patient states that he has developed a sore under her nose.   Reports exposure to grandchildren. States he has noticed crusting below his nose. States throat became very sore two days ago.   Review of Systems     Objective:   Physical Exam  Constitutional: He appears well-developed and well-nourished. No distress.  Ears: T.M's intact without inflammation Throat: moderately enlarged right tonsil without exudate or erythema; mildly enlarged left tonsil. Neck: no cervical adenopathy Lungs: clear     Assessment:    1. Tonsillitis - POCT rapid strep A - clindamycin (CLEOCIN) 300 MG capsule; Take 1 capsule (300 mg total) by mouth 3 (three) times daily.  Dispense: 30 capsule; Refill: 0  2. Impetigo: samples of triple abx ointment to apply 2-3 x day - clindamycin (CLEOCIN) 300 MG capsule; Take 1 capsule (300 mg total) by mouth 3 (three) times daily.  Dispense: 30 capsule; Refill: 0    Plan:    Discussed use of abx ointment.

## 2016-11-01 DIAGNOSIS — T84092D Other mechanical complication of internal right knee prosthesis, subsequent encounter: Secondary | ICD-10-CM | POA: Diagnosis not present

## 2016-11-01 DIAGNOSIS — R262 Difficulty in walking, not elsewhere classified: Secondary | ICD-10-CM | POA: Diagnosis not present

## 2016-11-07 ENCOUNTER — Other Ambulatory Visit: Payer: Self-pay | Admitting: Psychiatry

## 2016-11-08 NOTE — Telephone Encounter (Signed)
Done. Pt need to make follow up

## 2016-11-11 ENCOUNTER — Encounter: Payer: Self-pay | Admitting: Psychiatry

## 2016-11-11 ENCOUNTER — Ambulatory Visit (INDEPENDENT_AMBULATORY_CARE_PROVIDER_SITE_OTHER): Payer: 59 | Admitting: Psychiatry

## 2016-11-11 VITALS — BP 136/100 | HR 90 | Temp 98.6°F | Wt 204.8 lb

## 2016-11-11 DIAGNOSIS — F319 Bipolar disorder, unspecified: Secondary | ICD-10-CM

## 2016-11-11 DIAGNOSIS — F3130 Bipolar disorder, current episode depressed, mild or moderate severity, unspecified: Secondary | ICD-10-CM | POA: Diagnosis not present

## 2016-11-11 MED ORDER — LITHIUM CARBONATE 300 MG PO CAPS: ORAL_CAPSULE | ORAL | 3 refills | Status: DC

## 2016-11-11 MED ORDER — BUPROPION HCL ER (SR) 150 MG PO TB12: 150.0000 mg | ORAL_TABLET | Freq: Every day | ORAL | 1 refills | Status: DC

## 2016-11-11 NOTE — Progress Notes (Signed)
Psychiatric MD Progress Note   Patient Identification: Jeremiah [name] MRN:  [id number] Date of Evaluation:  11/11/2016 Referral Source: Dr. Bridgett Larsson Chief Complaint:   Chief Complaint    Follow-up; Medication Refill     Visit Diagnosis:    ICD-9-CM ICD-10-CM   1. Bipolar I disorder, most recent episode depressed ([place]) 296.50 F31.30   2. Alcohol dependence   303.93 F10.21    Diagnosis:   Patient Active Problem List   Diagnosis Date Noted  . Chest pain [R07.9] 03/05/2016  . Pedal edema [R60.0] 03/08/2015  . History of repair of rotator cuff [Z98.890] 02/17/2014  . Diabetes ([place]) [E11.9] 08/31/2013  . BP (high blood pressure) [I10] 08/31/2013  . Adult hypothyroidism [E03.9] 08/31/2013  . Obstructive apnea [G47.33] 08/31/2013  . Clinical depression [F32.9] 08/31/2013  . Diabetes mellitus ([place]) [E11.9] 08/31/2013  . Idiopathic localized osteoarthropathy [M19.91] 05/31/2013  . ABDOMINAL PAIN, LEFT UPPER QUADRANT [R10.12] 10/04/2009  . TARSAL TUNNEL SYNDROME, RIGHT [G57.50] 09/25/2009  . ANKLE PAIN, RIGHT [M25.579] 09/25/2009  . OTHER ANKLE SPRAIN AND STRAIN [id number] 09/25/2009  . BREAST MASS, RIGHT [N63.0] 01/07/2008  . INSOMNIA [G47.00] 01/07/2008  . GASTRIC ULCER, ACUTE [K25.3] 11/09/2007  . DUODENITIS [K29.80] 11/09/2007  . BLOOD IN STOOL, OCCULT [R19.5] 11/09/2007  . PERSONAL HISTORY OF COLONIC POLYPS [Z86.010] 11/09/2007  . BACK PAIN, LUMBAR [M54.5] 10/12/2007  . HELICOBACTER PYLORI GASTRITIS [A04.8] 09/09/2007  . GASTRIC ULCER, ACUTE, HEMORRHAGE [K25.0] 09/09/2007  . HIATAL HERNIA [K44.9] 09/09/2007  . COLONIC POLYPS, ADENOMATOUS [D12.6] 08/19/2007  . INTERNAL HEMORRHOIDS [K64.8] 08/19/2007  . EXTERNAL HEMORRHOIDS WITHOUT MENTION COMP [K64.4] 08/07/2007  . Blood in stool [K92.1] 08/07/2007  . ABDOMINAL PAIN, LEFT LOWER QUADRANT [R10.32] 08/07/2007  . SINUSITIS, ACUTE [J01.90] 06/12/2007  . ABDOMINAL PAIN [R10.9] 06/12/2007  . DIABETES MELLITUS, TYPE II [E11.9]  03/19/2007  . GOUT [M10.9] 03/19/2007  . DISORDER, CIRCADIAN RHYTHM SLEEP, NONORGANIC [G47.20, F51.8] 03/19/2007  . Depression [F32.9] 03/19/2007  . OSTEOARTHROSIS, GENERALIZED, MULTIPLE SITES [M15.9] 03/19/2007   History of Present Illness:   Patient is a 56 -year-old married male who presented for Follow-up. He reported that he missed his  last appointment and has been out of the Wellbutrin for the past 2 weeks. Patient reported that he was supposed to have his knee replacement surgery in January to his flu symptoms it has been rescheduled for the and of [place]. Patient reported that he is looking forward to the same due to severe pain. Patient reported that he was doing well on the combination of lithium and Wellbutrin. His lithium level was done by his PCP and it was slightly elevated. His PCP advised him to skip the doses. He does not know the number. I looked up his lithium level and it was 1.2. Patient reported that he is not having any side effects from the elevated lithium level. He denied having any abdominal pain and nausea or diarrhea or tremors. He stated that he has been doing well on the current combinations of the medications. His wife is supportive. He denied having any suicidal homicidal ideations or plans.     He appeared calm and collected during the interview. He denied having any perceptual disturbances. We discussed about his medications at length. Patient reported that he is not having any acute symptoms at this time.   Past Medical History:  Past Medical History:  Diagnosis Date  . Anxiety   . Arthritis    bil knees  . Bipolar disorder ([place])   . Colon polyp  Dec 2015   Dr Allen Norris  . Depression    dx 2004  . Diabetes mellitus without complication ([place])    type ll  controlled by weight & diet  . Gout   . H/O hiatal hernia    had surgery for that 2012  [facility]  . Heartburn   . Hypertension   . Hypothyroidism   . Sleep apnea    dx 1.5 yr ago...study @ [place]  .  Sleep apnea    uses c pap    Past Surgical History:  Procedure Laterality Date  . APPENDECTOMY    . COLONOSCOPY  Dec 2015   Dr Allen Norris  . EXCISIONAL HEMORRHOIDECTOMY  01/02/15  . FOOT SURGERY     right foot 2012  . HERNIA REPAIR     Left inguinal  . HERNIA REPAIR     hiatal hernia at [place]  . MANDIBLE SURGERY     1983  . REPLACEMENT TOTAL KNEE BILATERAL     patient had knee replacement to his right side in 2015 and right 2014  . ROTATOR CUFF REPAIR     x 3  on right shoulder  . SHOULDER ARTHROSCOPY WITH ROTATOR CUFF REPAIR AND SUBACROMIAL DECOMPRESSION Left 03/04/2013   Procedure: LEFT SHOULDER ARTHROSCOPY WITH ROTATOR CUFF REPAIR AND SUBACROMIAL DECOMPRESSION;  Surgeon: Marin Shutter, MD;  Location: [facility];  Service: Orthopedics;  Laterality: Left;  . SHOULDER SURGERY     x 3 on left   Family History:  Family History  Problem Relation Age of Onset  . CAD Mother   . Arthritis Mother   . Angina Mother   . Diabetes Father   . Heart disease Father   . Colon polyps Father   . Heart attack Father   . Bipolar disorder Sister   . Asthma Sister   . Schizophrenia Sister   . Diabetes Maternal Grandmother   . Cancer Maternal Grandmother   . Heart attack Maternal Grandfather   . Stomach cancer Paternal Grandmother   . Kidney disease Neg Hx   . Prostate cancer Neg Hx    Social History:   Social History   Social History  . Marital status: Married    Spouse name: N/A  . Number of children: N/A  . Years of education: N/A   Social History Main Topics  . Smoking status: Never Smoker  . Smokeless tobacco: Former Systems developer    Types: Chew    Quit date: 10/07/1989  . Alcohol use 3.6 - 4.8 oz/week    6 - 8 Cans of beer per week     Comment: 2 beers per day  . Drug use: No  . Sexual activity: Yes   Other Topics Concern  . None   Social History Narrative  . None   Additional Social History:  Married x 4 years. This is his second marrigae. Works at [employer] in [employer] division.  Both have  children from the first marriages. Patient drinks alcohol on a regular basis. He reported that this is lite alcohol and he does not feel that it is affecting his medication.  Musculoskeletal: Strength & Muscle Tone: within normal limits Gait & Station: normal Patient leans: N/A  Psychiatric Specialty Exam: Anxiety  Symptoms include insomnia.    Insomnia   Medication Refill     Review of Systems  Psychiatric/Behavioral: The patient has insomnia.     Blood pressure (!) 136/100, pulse 90, temperature 98.6 F (37 C), temperature source Oral, weight 204  lb 12.8 oz (92.9 kg).Body mass index is 29.39 kg/m.  General Appearance: Casual  Eye Contact:  Fair  Speech:  Clear and Coherent and Normal Rate  Volume:  Normal  Mood:  Anxious  Affect:  Congruent  Thought Process:  Coherent and Goal Directed  Orientation:  Full (Time, Place, and Person)  Thought Content:  WDL  Suicidal Thoughts:  No  Homicidal Thoughts:  No  Memory:  Immediate;   Fair  Judgement:  Fair  Insight:  Fair  Psychomotor Activity:  Normal  Concentration:  Fair  Recall:  [employer] [place]  Language: Fair  Akathisia:  No  Handed:  Right  AIMS (if indicated):    Assets:  Communication Skills Desire for Improvement Housing Intimacy Physical Health Social Support  ADL's:  Intact  Cognition: WNL  Sleep:  7-8   Is the patient at risk to self?  No. Has the patient been a risk to self in the past 6 months?  No. Has the patient been a risk to self within the distant past?  No. Is the patient a risk to others?  No. Has the patient been a risk to others in the past 6 months?  No. Has the patient been a risk to others within the distant past?  No.  Allergies:   Allergies  Allergen Reactions  . Tequin [Gatifloxacin] Anaphylaxis and Rash  . Amoxicillin Hives   Current Medications: Current Outpatient Prescriptions  Medication Sig Dispense Refill  . amLODipine (NORVASC) 10 MG tablet Take 5 mg by  mouth daily.    . clindamycin (CLEOCIN) 300 MG capsule Take 1 capsule (300 mg total) by mouth 3 (three) times daily. 30 capsule 0  . cyclobenzaprine (FLEXERIL) 10 MG tablet Take 1 tablet (10 mg total) by mouth 3 (three) times daily as needed for muscle spasms. 30 tablet 5  . EPIPEN 2-PAK 0.3 MG/0.3ML SOAJ injection     . HYDROcodone-acetaminophen (NORCO/VICODIN) 5-325 MG tablet     . HYDROcodone-homatropine (HYCODAN) 5-1.5 MG/5ML syrup 5 ml 4-6 hours as needed for cough 240 mL 0  . levothyroxine (SYNTHROID, LEVOTHROID) 25 MCG tablet Take 1 tablet (25 mcg total) by mouth daily before breakfast. 30 tablet 12  . lithium carbonate 300 MG capsule TAKE 2 CAPSULES(600 MG) BY MOUTH TWICE DAILY WITH A MEAL 120 capsule 0  . loratadine (CLARITIN) 10 MG tablet Take 10 mg by mouth daily.    . meloxicam (MOBIC) 15 MG tablet Take by mouth.    . mometasone (NASONEX) 50 MCG/ACT nasal spray SHAKE LQ AND U 1 TO 2 SPRAYS IEN QD  6  . Multiple Vitamin (MULTIVITAMIN WITH MINERALS) TABS Take 1 tablet by mouth daily.    . Testosterone (NATESTO) 5.5 MG/ACT GEL Place 1 Act into the nose 2 (two) times daily. 7.32 g 5  . traMADol (ULTRAM) 50 MG tablet TAKE 1 TABLET BY MOUTH EVERY 8 HOURS AS NEEDED FOR PAIN     No current facility-administered medications for this visit.     Previous Psychotropic Medications: Lithium Wellbutrin Abilify prozac zoloft celexa Depakote seroquel- could not function risperdal pristiq Lamotrigine Latuda belsorma Trazodone   Substance Abuse History in the last 12 months:  Yes.    Consequences of Substance Abuse: Negative NA  Medical Decision Making:  Review of Psycho-Social Stressors (1) and Review and summation of old records (2)  Treatment Plan Summary: Medication management     Discussed with patient at length about his medications treatment risks benefits and alternatives.  Will continue on lithium carbonate 600 mg by mouth twice a day.  Continue  Wellbutrin XL 150  mg in the morning.  Patient was given 90 day supply with 1 refill. He was also given the lab requisition form so he can have his stat  done including lithium level done in the morning. He agreed with the plan.  Patient will follow up after his knee surgery.   More than 50% of the time spent in psychoeducation, counseling and coordination of care.     This note was generated in part or whole with voice recognition software. Voice regonition is usually quite accurate but there are transcription errors that can and very often do occur. I apologize for any typographical errors that were not detected and corrected.    Rainey Pines, MD    2/5/20183:34 PM

## 2016-11-12 ENCOUNTER — Other Ambulatory Visit: Payer: Self-pay | Admitting: Psychiatry

## 2016-11-12 ENCOUNTER — Ambulatory Visit: Payer: 59 | Admitting: Psychiatry

## 2016-11-12 ENCOUNTER — Telehealth: Payer: Self-pay

## 2016-11-12 DIAGNOSIS — J3089 Other allergic rhinitis: Secondary | ICD-10-CM | POA: Diagnosis not present

## 2016-11-12 DIAGNOSIS — J301 Allergic rhinitis due to pollen: Secondary | ICD-10-CM | POA: Diagnosis not present

## 2016-11-12 DIAGNOSIS — I10 Essential (primary) hypertension: Secondary | ICD-10-CM | POA: Diagnosis not present

## 2016-11-12 LAB — CBC WITH DIFFERENTIAL/PLATELET
Basophils Absolute: 0.1 10*3/uL (ref 0.0–0.2)
Basos: 1 %
EOS (ABSOLUTE): 0.2 10*3/uL (ref 0.0–0.4)
Eos: 3 %
Hematocrit: 47.4 % (ref 37.5–51.0)
Hemoglobin: 16.5 g/dL (ref 13.0–17.7)
Immature Grans (Abs): 0 10*3/uL (ref 0.0–0.1)
Immature Granulocytes: 0 %
Lymphocytes Absolute: 1.7 10*3/uL (ref 0.7–3.1)
Lymphs: 26 %
MCH: 30.6 pg (ref 26.6–33.0)
MCHC: 34.8 g/dL (ref 31.5–35.7)
MCV: 88 fL (ref 79–97)
Monocytes Absolute: 0.6 10*3/uL (ref 0.1–0.9)
Monocytes: 9 %
Neutrophils Absolute: 3.8 10*3/uL (ref 1.4–7.0)
Neutrophils: 61 %
Platelets: 217 10*3/uL (ref 150–379)
RBC: 5.39 x10E6/uL (ref 4.14–5.80)
RDW: 14.5 % (ref 12.3–15.4)
WBC: 6.3 10*3/uL (ref 3.4–10.8)

## 2016-11-12 LAB — BASIC METABOLIC PANEL
BUN/Creatinine Ratio: 12 (ref 9–20)
BUN: 14 mg/dL (ref 6–24)
CO2: 22 mmol/L (ref 18–29)
Calcium: 9.9 mg/dL (ref 8.7–10.2)
Chloride: 102 mmol/L (ref 96–106)
Creatinine, Ser: 1.16 mg/dL (ref 0.76–1.27)
GFR calc Af Amer: 80 mL/min/{1.73_m2} (ref >59)
GFR calc non Af Amer: 70 mL/min/{1.73_m2} (ref >59)
Glucose: 165 mg/dL — ABNORMAL HIGH (ref 65–99)
Potassium: 5.2 mmol/L (ref 3.5–5.2)
Sodium: 136 mmol/L (ref 134–144)

## 2016-11-12 LAB — LITHIUM LEVEL: Lithium Lvl: 0.8 mmol/L (ref 0.6–1.2)

## 2016-11-12 NOTE — Telephone Encounter (Signed)
prior Jeremiah [name] is not required at this time for wellbutrin xl

## 2016-11-13 ENCOUNTER — Telehealth: Payer: Self-pay | Admitting: Psychiatry

## 2016-11-13 DIAGNOSIS — Z01818 Encounter for other preprocedural examination: Secondary | ICD-10-CM | POA: Diagnosis not present

## 2016-11-13 DIAGNOSIS — T84092D Other mechanical complication of internal right knee prosthesis, subsequent encounter: Secondary | ICD-10-CM | POA: Diagnosis not present

## 2016-11-13 DIAGNOSIS — Z96651 Presence of right artificial knee joint: Secondary | ICD-10-CM | POA: Diagnosis not present

## 2016-11-13 DIAGNOSIS — I10 Essential (primary) hypertension: Secondary | ICD-10-CM | POA: Diagnosis not present

## 2016-11-13 NOTE — Telephone Encounter (Signed)
Labs reviewed. Li level- 0.8. Other labs WNL

## 2016-11-14 DIAGNOSIS — Z118 Encounter for screening for other infectious and parasitic diseases: Secondary | ICD-10-CM | POA: Diagnosis not present

## 2016-11-15 DIAGNOSIS — J3089 Other allergic rhinitis: Secondary | ICD-10-CM | POA: Diagnosis not present

## 2016-11-15 DIAGNOSIS — J301 Allergic rhinitis due to pollen: Secondary | ICD-10-CM | POA: Diagnosis not present

## 2016-11-19 DIAGNOSIS — J3089 Other allergic rhinitis: Secondary | ICD-10-CM | POA: Diagnosis not present

## 2016-11-19 DIAGNOSIS — J301 Allergic rhinitis due to pollen: Secondary | ICD-10-CM | POA: Diagnosis not present

## 2016-11-26 DIAGNOSIS — J3089 Other allergic rhinitis: Secondary | ICD-10-CM | POA: Diagnosis not present

## 2016-11-26 DIAGNOSIS — J301 Allergic rhinitis due to pollen: Secondary | ICD-10-CM | POA: Diagnosis not present

## 2016-11-29 DIAGNOSIS — J3089 Other allergic rhinitis: Secondary | ICD-10-CM | POA: Diagnosis not present

## 2016-11-29 DIAGNOSIS — J301 Allergic rhinitis due to pollen: Secondary | ICD-10-CM | POA: Diagnosis not present

## 2016-12-03 DIAGNOSIS — J301 Allergic rhinitis due to pollen: Secondary | ICD-10-CM | POA: Diagnosis not present

## 2016-12-03 DIAGNOSIS — J3089 Other allergic rhinitis: Secondary | ICD-10-CM | POA: Diagnosis not present

## 2016-12-04 DIAGNOSIS — T84028A Dislocation of other internal joint prosthesis, initial encounter: Secondary | ICD-10-CM | POA: Diagnosis not present

## 2016-12-04 DIAGNOSIS — T84032A Mechanical loosening of internal right knee prosthetic joint, initial encounter: Secondary | ICD-10-CM | POA: Diagnosis not present

## 2016-12-04 DIAGNOSIS — Z96651 Presence of right artificial knee joint: Secondary | ICD-10-CM | POA: Diagnosis not present

## 2016-12-04 DIAGNOSIS — Y792 Prosthetic and other implants, materials and accessory orthopedic devices associated with adverse incidents: Secondary | ICD-10-CM | POA: Diagnosis not present

## 2016-12-04 DIAGNOSIS — M25861 Other specified joint disorders, right knee: Secondary | ICD-10-CM | POA: Diagnosis not present

## 2016-12-04 DIAGNOSIS — Z471 Aftercare following joint replacement surgery: Secondary | ICD-10-CM | POA: Diagnosis not present

## 2016-12-06 NOTE — Progress Notes (Signed)
Labwork results

## 2016-12-07 DIAGNOSIS — Z4733 Aftercare following explantation of knee joint prosthesis: Secondary | ICD-10-CM | POA: Diagnosis not present

## 2016-12-17 DIAGNOSIS — M25569 Pain in unspecified knee: Secondary | ICD-10-CM | POA: Diagnosis not present

## 2016-12-20 DIAGNOSIS — Z96641 Presence of right artificial hip joint: Secondary | ICD-10-CM | POA: Diagnosis not present

## 2016-12-31 ENCOUNTER — Encounter: Payer: Self-pay | Admitting: Family Medicine

## 2016-12-31 ENCOUNTER — Ambulatory Visit (INDEPENDENT_AMBULATORY_CARE_PROVIDER_SITE_OTHER): Payer: 59 | Admitting: Family Medicine

## 2016-12-31 VITALS — BP 112/64 | HR 72 | Temp 98.7°F | Resp 16 | Wt 196.0 lb

## 2016-12-31 DIAGNOSIS — K4090 Unilateral inguinal hernia, without obstruction or gangrene, not specified as recurrent: Secondary | ICD-10-CM | POA: Diagnosis not present

## 2016-12-31 DIAGNOSIS — M109 Gout, unspecified: Secondary | ICD-10-CM

## 2016-12-31 MED ORDER — COLCHICINE 0.6 MG PO TABS: 0.6000 mg | ORAL_TABLET | Freq: Two times a day (BID) | ORAL | 5 refills | Status: DC

## 2016-12-31 NOTE — Progress Notes (Signed)
Patient: Jeremiah [name] Male    DOB: [birth year]   56 y.o.   MRN: [id number] Visit Date: 12/31/2016  Today's Provider: [name], MD   Chief Complaint  Patient presents with  . Hernia    possibly    Subjective:    HPI Patient comes in today c/o of a possible inguinal hernia on his left side. Patient reports that he was seen in the office about 2 months ago with flu symptoms, and reports that his cough was so severe that he pulled a muscle. Patient now seems to think that his severe cough could have cause a hernia because he still has the pain. Patient has been taking his prescription pain medications as directed to help with his pain.  Patient also mentions that he has a gout flare in his left great toe. Patient is requesting that something be called in for this.     Allergies  Allergen Reactions  . Tequin [Gatifloxacin] Anaphylaxis and Rash  . Amoxicillin Hives     Current Outpatient Prescriptions:  .  amLODipine (NORVASC) 10 MG tablet, Take 5 mg by mouth daily., Disp: , Rfl:  .  buPROPion (WELLBUTRIN SR) 150 MG 12 hr tablet, Take 1 tablet (150 mg total) by mouth daily., Disp: 90 tablet, Rfl: 1 .  cyclobenzaprine (FLEXERIL) 10 MG tablet, Take 1 tablet (10 mg total) by mouth 3 (three) times daily as needed for muscle spasms., Disp: 30 tablet, Rfl: 5 .  EPIPEN 2-PAK 0.3 MG/0.3ML SOAJ injection, , Disp: , Rfl:  .  HYDROcodone-acetaminophen (NORCO/VICODIN) 5-325 MG tablet, , Disp: , Rfl:  .  levothyroxine (SYNTHROID, LEVOTHROID) 25 MCG tablet, Take 1 tablet (25 mcg total) by mouth daily before breakfast., Disp: 30 tablet, Rfl: 12 .  lithium carbonate 300 MG capsule, TAKE 2 CAPSULES(600 MG) BY MOUTH TWICE DAILY WITH A MEAL, Disp: 120 capsule, Rfl: 3 .  loratadine (CLARITIN) 10 MG tablet, Take 10 mg by mouth daily., Disp: , Rfl:  .  meloxicam (MOBIC) 15 MG tablet, Take by mouth., Disp: , Rfl:  .  mometasone (NASONEX) 50 MCG/ACT nasal spray, SHAKE LQ AND U 1 TO 2 SPRAYS  IEN QD, Disp: , Rfl: 6 .  Multiple Vitamin (MULTIVITAMIN WITH MINERALS) TABS, Take 1 tablet by mouth daily., Disp: , Rfl:  .  Testosterone (NATESTO) 5.5 MG/ACT GEL, Place 1 Act into the nose 2 (two) times daily., Disp: 7.32 g, Rfl: 5 .  traMADol (ULTRAM) 50 MG tablet, TAKE 1 TABLET BY MOUTH EVERY 8 HOURS AS NEEDED FOR PAIN, Disp: , Rfl:  .  clindamycin (CLEOCIN) 300 MG capsule, Take 1 capsule (300 mg total) by mouth 3 (three) times daily. (Patient not taking: Reported on 12/31/2016), Disp: 30 capsule, Rfl: 0 .  HYDROcodone-homatropine (HYCODAN) 5-1.5 MG/5ML syrup, 5 ml 4-6 hours as needed for cough (Patient not taking: Reported on 12/31/2016), Disp: 240 mL, Rfl: 0  Review of Systems  Constitutional: Positive for activity change and fatigue.  Gastrointestinal: Positive for abdominal pain.       Lower abdominal pain.   Genitourinary: Negative for decreased urine volume, difficulty urinating, dysuria, frequency, hematuria, penile swelling, scrotal swelling and testicular pain.  Musculoskeletal: Positive for arthralgias, joint swelling and myalgias.    Social History  Substance Use Topics  . Smoking status: Never Smoker  . Smokeless tobacco: Former Systems developer    Types: Chew    Quit date: 10/07/1989  . Alcohol use 3.6 - 4.8 oz/week  6 - 8 Cans of beer per week     Comment: 2 beers per day   Objective:   BP 112/64 (BP Location: Right Arm, Patient Position: Sitting, Cuff Size: Normal)   Pulse 72   Temp 98.7 F (37.1 C)   Resp 16   Wt 196 lb (88.9 kg)   BMI 28.12 kg/m  Vitals:   12/31/16 0939  BP: 112/64  Pulse: 72  Resp: 16  Temp: 98.7 F (37.1 C)  Weight: 196 lb (88.9 kg)     Physical Exam  Constitutional: He is oriented to person, place, and time. He appears well-developed and well-nourished.  HENT:  Head: Normocephalic and atraumatic.  Eyes: Conjunctivae are normal. No scleral icterus.  Cardiovascular: Normal rate, regular rhythm and normal heart sounds.   Pulmonary/Chest:  Effort normal and breath sounds normal.  Abdominal: Soft.  Reducible left inguinal hernia.  Musculoskeletal: He exhibits tenderness.  Tenderness,swelling,warmth right MTP joint.  Neurological: He is alert and oriented to person, place, and time.  Skin: Skin is warm and dry.  Psychiatric: He has a normal mood and affect. His behavior is normal. Judgment and thought content normal.        Assessment & Plan:     1. Left inguinal hernia  - Ambulatory referral to General Surgery  2. Acute gout involving toe of left foot, unspecified cause Worsening. Start colchicine as below. Follow up in 3 weeks.  - colchicine 0.6 MG tablet; Take 1 tablet (0.6 mg total) by mouth 2 (two) times daily.  Dispense: 60 tablet; Refill: 5       I have done the exam and reviewed the above chart and it is accurate to the best of my knowledge. Development worker, community has been used in this note in any air is in the dictation or transcription are unintentional.  [name], MD  [place]

## 2017-01-06 ENCOUNTER — Ambulatory Visit (INDEPENDENT_AMBULATORY_CARE_PROVIDER_SITE_OTHER): Payer: 59 | Admitting: General Surgery

## 2017-01-06 ENCOUNTER — Ambulatory Visit: Payer: 59 | Admitting: General Surgery

## 2017-01-06 ENCOUNTER — Encounter: Payer: Self-pay | Admitting: General Surgery

## 2017-01-06 VITALS — BP 128/80 | HR 78 | Resp 12 | Ht 70.0 in | Wt 194.0 lb

## 2017-01-06 DIAGNOSIS — M25561 Pain in right knee: Secondary | ICD-10-CM | POA: Diagnosis not present

## 2017-01-06 DIAGNOSIS — K4091 Unilateral inguinal hernia, without obstruction or gangrene, recurrent: Secondary | ICD-10-CM | POA: Diagnosis not present

## 2017-01-06 NOTE — Patient Instructions (Signed)
Inguinal Hernia, Adult  An inguinal hernia is when fat or the intestines push through the area where the leg meets the lower belly (groin) and make a rounded lump (bulge). This condition happens over time. There are three types of inguinal hernias. These types include:  · Hernias that can be pushed back into the belly (are reducible).  · Hernias that cannot be pushed back into the belly (are incarcerated).  · Hernias that cannot be pushed back into the belly and lose their blood supply (get strangulated). This type needs emergency surgery.    Follow these instructions at home:  Lifestyle   · Drink enough fluid to keep your urine (pee) clear or pale yellow.  · Eat plenty of fruits, vegetables, and whole grains. These have a lot of fiber. Talk with your doctor if you have questions.  · Avoid lifting heavy objects.  · Avoid standing for long periods of time.  · Do not use tobacco products. These include cigarettes, chewing tobacco, or e-cigarettes. If you need help quitting, ask your doctor.  · Try to stay at a healthy weight.  General instructions   · Do not try to force the hernia back in.  · Watch your hernia for any changes in color or size. Let your doctor know if there are any changes.  · Take over-the-counter and prescription medicines only as told by your doctor.  · Keep all follow-up visits as told by your doctor. This is important.  Contact a doctor if:  · You have a fever.  · You have new symptoms.  · Your symptoms get worse.  Get help right away if:  · The area where the legs meets the lower belly has:  ? Pain that gets worse suddenly.  ? A bulge that gets bigger suddenly and does not go down.  ? A bulge that turns red or purple.  ? A bulge that is painful to the touch.  · You are a man and your scrotum:  ? Suddenly feels painful.  ? Suddenly changes in size.  · You feel sick to your stomach (nauseous) and this feeling does not go away.  · You throw up (vomit) and this keeps happening.  · You feel your  heart beating a lot more quickly than normal.  · You cannot poop (have a bowel movement) or pass gas.  This information is not intended to replace advice given to you by your health care provider. Make sure you discuss any questions you have with your health care provider.  Document Released: 10/24/2006 Document Revised: 02/29/2016 Document Reviewed: 08/03/2014  Elsevier Interactive Patient Education © 2017 [employer]

## 2017-01-06 NOTE — Progress Notes (Signed)
Patient ID: AMROM ORE, male   DOB: [birth year], 56 y.o.   MRN: [id number]  Chief Complaint  Patient presents with  . Other    hernia    HPI EMILLIO NGO is a 56 y.o. male here for evaluation of a left inguinal hernia.  About two weeks ago he sneezed and felt pain in the area. He reports the are pops out. He recently had right knee surgery. I have reviewed the history of present illness with the patient.  HPI  Past Medical History:  Diagnosis Date  . Anxiety   . Arthritis    bil knees  . Bipolar disorder ([place])   . Colon polyp Dec 2015   Dr Allen Norris  . Depression    dx 2004  . Diabetes mellitus without complication ([place])    type ll  controlled by weight & diet  . Gout   . H/O hiatal hernia    had surgery for that 2012  [facility]  . Heartburn   . Hypertension   . Hypothyroidism   . Sleep apnea    dx 1.5 yr ago...study @ [place]  . Sleep apnea    uses c pap    Past Surgical History:  Procedure Laterality Date  . APPENDECTOMY    . COLONOSCOPY  Dec 2015   Dr Allen Norris  . EXCISIONAL HEMORRHOIDECTOMY  01/02/15  . FOOT SURGERY     right foot 2012  . HERNIA REPAIR     Left inguinal  . HERNIA REPAIR     hiatal hernia at [place]  . MANDIBLE SURGERY     1983  . REPLACEMENT TOTAL KNEE BILATERAL     patient had knee replacement to his right side in 2015 and right 2014, Revision of R knee 12/04/16  . ROTATOR CUFF REPAIR     x 3  on right shoulder  . SHOULDER ARTHROSCOPY WITH ROTATOR CUFF REPAIR AND SUBACROMIAL DECOMPRESSION Left 03/04/2013   Procedure: LEFT SHOULDER ARTHROSCOPY WITH ROTATOR CUFF REPAIR AND SUBACROMIAL DECOMPRESSION;  Surgeon: Marin Shutter, MD;  Location: [facility];  Service: Orthopedics;  Laterality: Left;  . SHOULDER SURGERY     x 3 on left    Family History  Problem Relation Age of Onset  . CAD Mother   . Arthritis Mother   . Angina Mother   . Diabetes Father   . Heart disease Father   . Colon polyps Father   . Heart attack Father   . Bipolar disorder  Sister   . Asthma Sister   . Schizophrenia Sister   . Diabetes Maternal Grandmother   . Cancer Maternal Grandmother   . Heart attack Maternal Grandfather   . Stomach cancer Paternal Grandmother   . Kidney disease Neg Hx   . Prostate cancer Neg Hx     Social History Social History  Substance Use Topics  . Smoking status: Never Smoker  . Smokeless tobacco: Former Systems developer    Types: Chew    Quit date: 10/07/1989  . Alcohol use 3.6 - 4.8 oz/week    6 - 8 Cans of beer per week     Comment: 2 beers per day    Allergies  Allergen Reactions  . Tequin [Gatifloxacin] Anaphylaxis and Rash  . Amoxicillin Hives    Current Outpatient Prescriptions  Medication Sig Dispense Refill  . amLODipine (NORVASC) 10 MG tablet Take 5 mg by mouth daily.    [name] buPROPion (WELLBUTRIN SR) 150 MG 12 hr tablet Take 1 tablet (  150 mg total) by mouth daily. 90 tablet 1  . colchicine 0.6 MG tablet Take 1 tablet (0.6 mg total) by mouth 2 (two) times daily. 60 tablet 5  . EPIPEN 2-PAK 0.3 MG/0.3ML SOAJ injection     . levothyroxine (SYNTHROID, LEVOTHROID) 25 MCG tablet Take 1 tablet (25 mcg total) by mouth daily before breakfast. 30 tablet 12  . lithium carbonate 300 MG capsule TAKE 2 CAPSULES(600 MG) BY MOUTH TWICE DAILY WITH A MEAL 120 capsule 3  . loratadine (CLARITIN) 10 MG tablet Take 10 mg by mouth daily.    . meloxicam (MOBIC) 15 MG tablet Take by mouth.    . mometasone (NASONEX) 50 MCG/ACT nasal spray SHAKE LQ AND U 1 TO 2 SPRAYS IEN QD  6  . Multiple Vitamin (MULTIVITAMIN WITH MINERALS) TABS Take 1 tablet by mouth daily.    . Testosterone (NATESTO) 5.5 MG/ACT GEL Place 1 Act into the nose 2 (two) times daily. 7.32 g 5  . traMADol (ULTRAM) 50 MG tablet TAKE 1 TABLET BY MOUTH EVERY 8 HOURS AS NEEDED FOR PAIN     No current facility-administered medications for this visit.     Review of Systems Review of Systems  Constitutional: Negative.   Respiratory: Negative.   Cardiovascular: Negative.    Gastrointestinal: Negative.     Blood pressure 128/80, pulse 78, resp. rate 12, height 5\' 10"  (1.778 m), weight 194 lb (88 kg).  Physical Exam Physical Exam  Constitutional: He is oriented to person, place, and time. He appears well-developed and well-nourished.  Eyes: Conjunctivae are normal. No scleral icterus.  Neck: Neck supple.  Cardiovascular: Normal rate, regular rhythm and normal heart sounds.   Pulmonary/Chest: Effort normal and breath sounds normal.  Abdominal: Soft. Normal appearance and bowel sounds are normal. There is tenderness. A hernia is present. Hernia confirmed positive in the left inguinal area.  Lymphadenopathy:    He has no cervical adenopathy.  Neurological: He is alert and oriented to person, place, and time.  Skin: Skin is warm and dry.  Psychiatric: He has a normal mood and affect.    Data Reviewed    Assessment    Recurrent left inguinal hernia    Plan    Discussed repair with pt. Laparoscopic approach advised Risks and benefits explained    Hernia precautions and incarceration were discussed with the patient. If they develop symptoms of an incarcerated hernia, they were encouraged to seek prompt medical attention.  I have recommended repair of the hernia using mesh on an outpatient basis in the near future. The risk of infection was reviewed. The role of prosthetic mesh to minimize the risk of recurrence was reviewed.  Patient is presently under going rehab for recent right knee surgery. He wishes to discuss hernia repair with the rehab and disability folks. Patient will notify the office when he would like to proceed with left inguinal hernia repair. Surgery instructions were reviewed with the patient today.   This has been scribed by Lesly Rubenstein LPN    Aeralyn Barna G 01/08/2017, 9:11 AM

## 2017-01-09 DIAGNOSIS — M25561 Pain in right knee: Secondary | ICD-10-CM | POA: Diagnosis not present

## 2017-01-14 DIAGNOSIS — K4091 Unilateral inguinal hernia, without obstruction or gangrene, recurrent: Secondary | ICD-10-CM | POA: Diagnosis not present

## 2017-01-15 ENCOUNTER — Ambulatory Visit: Payer: Self-pay | Admitting: Family Medicine

## 2017-01-16 DIAGNOSIS — M25561 Pain in right knee: Secondary | ICD-10-CM | POA: Diagnosis not present

## 2017-01-16 DIAGNOSIS — J301 Allergic rhinitis due to pollen: Secondary | ICD-10-CM | POA: Diagnosis not present

## 2017-01-16 DIAGNOSIS — J3089 Other allergic rhinitis: Secondary | ICD-10-CM | POA: Diagnosis not present

## 2017-01-20 DIAGNOSIS — M25561 Pain in right knee: Secondary | ICD-10-CM | POA: Diagnosis not present

## 2017-01-20 NOTE — Progress Notes (Signed)
01/21/2017 4:41 PM   Jeremiah [name] Feb 04, 1961 [id number]  Referring provider: Jerrol Banana., MD [address] [place], [place] [address]  Chief Complaint  Patient presents with  . Hypogonadism    6 month follow up   . Erectile Dysfunction  . Benign Prostatic Hypertrophy    HPI: Patient is a 56 year old Caucasian male who is referred by [name] for hypogonadism.  Hypogonadism Patient is experiencing a decrease in libido, a lack of energy, a decrease in strength, a decreased enjoyment in life,  sadness and/or grumpiness, erections being less strong, falling asleep after dinner and a recent deterioration in their work performance.  This is indicated by his responses to the ADAM questionnaire.  He is no longer having spontaneous erections at night.  He has sleep apnea and is not sleeping with a CPAP machine.  His pretreatment testosterone level was 208 ng/dL on 05/07/2016 and 152 ng/dL on 07/10/2016.  He had been  managing his hypogonadism with testosterone cypionate, Androderm and AndroGel.   He did not like the unsteady levels of the shots and his levels were not therapeutic with the topicals.  He is now using Natesto.  His prolactin level was 14.8 ng/mL on 10/06/2015.  His last testosterone level was 172 ng/mL on 08/26/2016.       Androgen Deficiency in the Aging Male    [place] Name 01/21/17 1600         Androgen Deficiency in the Aging Male   Do you have a decrease in libido (sex drive) Yes     Do you have lack of energy Yes     Do you have a decrease in strength and/or endurance Yes     Have you lost height No     Have you noticed a decreased "enjoyment of life" Yes     Are you sad and/or grumpy Yes     Are your erections less strong Yes     Have you noticed a recent deterioration in your ability to play sports Yes     Are you falling asleep after dinner Yes     Has there been a recent deterioration in your work performance Yes       BPH WITH  LUTS His IPSS score today is 7, which is mild lower urinary tract symptomatology. He is mostly satisfied with his quality life due to his urinary symptoms.  His previous I PSS score was 16/3.   His previous PVR was 37 mL.  His major complaint today nocturia x 4.  He has had these symptoms for the last few years.  He denies any dysuria, hematuria or suprapubic pain.   He also denies any recent fevers, chills, nausea or vomiting.  He does not have a family history of PCa.     IPSS    Row Name 01/21/17 1600         International Prostate Symptom Score   How often have you had the sensation of not emptying your bladder? Less than 1 in 5     How often have you had to urinate less than every two hours? Less than half the time     How often have you found you stopped and started again several times when you urinated? Less than 1 in 5 times     How often have you found it difficult to postpone urination? Not at All     How often have you had a weak  urinary stream? Less than 1 in 5 times     How often have you had to strain to start urination? Not at All     How many times did you typically get up at night to urinate? 2 Times     Total IPSS Score 7       Quality of Life due to urinary symptoms   If you were to spend the rest of your life with your urinary condition just the way it is now how would you feel about that? Mostly Satisfied        Score:  1-7 Mild 8-19 Moderate 20-35 Severe  Erectile dysfunction His SHIM score is 7, which is severe ED.   His previous SHIM score was 7.  He has been having difficulty with erections for four to five years.   His major complaint is no or little firmness with his ED.   His libido is diminished.   His risk factors for ED are age, BPH, hypogonadism, DM, HTN, hypothyroidism, sleep apnea, stress, anxiety, depression, antidepressants and blood pressure medications.   He denies any painful erections or curvatures with his erections.      SHIM    Row Name  01/21/17 1601         SHIM: Over the last 6 months:   How do you rate your confidence that you could get and keep an erection? Very Low     When you had erections with sexual stimulation, how often were your erections hard enough for penetration (entering your partner)? Almost Never or Never     During sexual intercourse, how often were you able to maintain your erection after you had penetrated (entered) your partner? Almost Never or Never     During sexual intercourse, how difficult was it to maintain your erection to completion of intercourse? Extremely Difficult     When you attempted sexual intercourse, how often was it satisfactory for you? Sometimes (about half the time)       SHIM Total Score   SHIM 7        Score: 1-7 Severe ED 8-11 Moderate ED 12-16 Mild-Moderate ED 17-21 Mild ED 22-25 No ED   PMH: Past Medical History:  Diagnosis Date  . Anxiety   . Arthritis    bil knees  . Bipolar disorder ([place])   . Colon polyp Dec 2015   Dr Allen Norris  . Depression    dx 2004  . Diabetes mellitus without complication ([place])    type ll  controlled by weight & diet  . Gout   . H/O hiatal hernia    had surgery for that 2012  [facility]  . Heartburn   . Hypertension   . Hypothyroidism   . Sleep apnea    dx 1.5 yr ago...study @ [place]  . Sleep apnea    uses c pap    Surgical History: Past Surgical History:  Procedure Laterality Date  . APPENDECTOMY    . COLONOSCOPY  Dec 2015   Dr Allen Norris  . EXCISIONAL HEMORRHOIDECTOMY  01/02/15  . FOOT SURGERY     right foot 2012  . HERNIA REPAIR     Left inguinal  . HERNIA REPAIR     hiatal hernia at [place]  . MANDIBLE SURGERY     1983  . REPLACEMENT TOTAL KNEE BILATERAL     patient had knee replacement to his right side in 2015 and right 2014, Revision of R knee 12/04/16  . ROTATOR  CUFF REPAIR     x 3  on right shoulder  . SHOULDER ARTHROSCOPY WITH ROTATOR CUFF REPAIR AND SUBACROMIAL DECOMPRESSION Left 03/04/2013   Procedure: LEFT  SHOULDER ARTHROSCOPY WITH ROTATOR CUFF REPAIR AND SUBACROMIAL DECOMPRESSION;  Surgeon: Marin Shutter, MD;  Location: [facility];  Service: Orthopedics;  Laterality: Left;  . SHOULDER SURGERY     x 3 on left    Home Medications:  Allergies as of 01/21/2017      Reactions   Tequin [gatifloxacin] Anaphylaxis, Rash   Amoxicillin Hives      Medication List       Accurate as of 01/21/17  4:41 PM. Always use your most recent med list.          allopurinol 100 MG tablet Commonly known as:  ZYLOPRIM Take 1 tablet (100 mg total) by mouth daily.   amLODipine 10 MG tablet Commonly known as:  NORVASC Take 5 mg by mouth daily.   buPROPion 150 MG 12 hr tablet Commonly known as:  WELLBUTRIN SR Take 1 tablet (150 mg total) by mouth daily.   colchicine 0.6 MG tablet Take 1 tablet (0.6 mg total) by mouth 2 (two) times daily.   EPIPEN 2-PAK 0.3 mg/0.3 mL Soaj injection Generic drug:  EPINEPHrine   levothyroxine 25 MCG tablet Commonly known as:  SYNTHROID, LEVOTHROID Take 1 tablet (25 mcg total) by mouth daily before breakfast.   lithium carbonate 300 MG capsule TAKE 2 CAPSULES(600 MG) BY MOUTH TWICE DAILY WITH A MEAL   loratadine 10 MG tablet Commonly known as:  CLARITIN Take 10 mg by mouth daily.   meloxicam 15 MG tablet Commonly known as:  MOBIC Take by mouth.   mometasone 50 MCG/ACT nasal spray Commonly known as:  NASONEX SHAKE LQ AND U 1 TO 2 SPRAYS IEN QD   multivitamin with minerals Tabs tablet Take 1 tablet by mouth daily.   Testosterone 5.5 MG/ACT Gel Commonly known as:  NATESTO Place 1 Act into the nose 2 (two) times daily.   traMADol 50 MG tablet Commonly known as:  ULTRAM TAKE 1 TABLET BY MOUTH EVERY 8 HOURS AS NEEDED FOR PAIN       Allergies:  Allergies  Allergen Reactions  . Tequin [Gatifloxacin] Anaphylaxis and Rash  . Amoxicillin Hives    Family History: Family History  Problem Relation Age of Onset  . CAD Mother   . Arthritis Mother   . Angina  Mother   . Diabetes Father   . Heart disease Father   . Colon polyps Father   . Heart attack Father   . Bipolar disorder Sister   . Asthma Sister   . Schizophrenia Sister   . Diabetes Maternal Grandmother   . Cancer Maternal Grandmother   . Heart attack Maternal Grandfather   . Stomach cancer Paternal Grandmother   . Kidney disease Neg Hx   . Prostate cancer Neg Hx     Social History:  reports that he has never smoked. He quit smokeless tobacco use about 27 years ago. His smokeless tobacco use included Chew. He reports that he drinks about 3.6 - 4.8 oz of alcohol per week . He reports that he does not use drugs.  ROS: UROLOGY Frequent Urination?: No Hard to postpone urination?: No Burning/pain with urination?: No Get up at night to urinate?: No Leakage of urine?: No Urine stream starts and stops?: No Trouble starting stream?: No Do you have to strain to urinate?: No Blood in urine?: No Urinary tract  infection?: No Sexually transmitted disease?: No Injury to kidneys or bladder?: No Painful intercourse?: No Weak stream?: No Erection problems?: Yes Penile pain?: No  Gastrointestinal Nausea?: No Vomiting?: No Indigestion/heartburn?: No Diarrhea?: No Constipation?: No  Constitutional Fever: No Night sweats?: No Weight loss?: No Fatigue?: No  Skin Skin rash/lesions?: No Itching?: No  Eyes Blurred vision?: No Double vision?: No  Ears/Nose/Throat Sore throat?: No Sinus problems?: No  Hematologic/Lymphatic Swollen glands?: No Easy bruising?: No  Cardiovascular Leg swelling?: No Chest pain?: No  Respiratory Cough?: No Shortness of breath?: No  Endocrine Excessive thirst?: No  Musculoskeletal Back pain?: No Joint pain?: Yes  Neurological Headaches?: No Dizziness?: No  Psychologic Depression?: Yes Anxiety?: No  Physical Exam: BP (!) 134/103   Pulse 69   Ht 5\' 10"  (1.778 m)   Wt 202 lb (91.6 kg)   BMI 28.98 kg/m   Constitutional:  Well nourished. Alert and oriented, No acute distress. HEENT: [place] AT, moist mucus membranes. Trachea midline, no masses. Cardiovascular: No clubbing, cyanosis, or edema. Respiratory: Normal respiratory effort, no increased work of breathing. GI: Abdomen is soft, non tender, non distended, no abdominal masses. Liver and spleen not palpable.  No hernias appreciated.  Stool sample for occult testing is not indicated.   GU: No CVA tenderness.  No bladder fullness or masses.  Patient with circumcised phallus.   Urethral meatus is patent.  No penile discharge. No penile lesions or rashes. Scrotum without lesions, cysts, rashes and/or edema.  Testicles are located scrotally bilaterally. No masses are appreciated in the testicles. Left and right epididymis are normal. Rectal: Patient with  normal sphincter tone. Anus and perineum without scarring or rashes. No rectal masses are appreciated. Prostate is approximately 50 grams, no nodules are appreciated. Seminal vesicles are normal. Skin: No rashes, bruises or suspicious lesions. Lymph: No cervical or inguinal adenopathy. Neurologic: Grossly intact, no focal deficits, moving all 4 extremities. Psychiatric: Normal mood and affect.  Laboratory Data: Lab Results  Component Value Date   WBC 6.3 11/12/2016   HGB 14.5 12/14/2014   HCT 47.4 11/12/2016   MCV 88 11/12/2016   PLT 217 11/12/2016    Lab Results  Component Value Date   CREATININE 1.16 11/12/2016    Lab Results  Component Value Date   PSA 0.39 03/16/2007    Lab Results  Component Value Date   TESTOSTERONE 172 (L) 08/26/2016    Lab Results  Component Value Date   HGBA1C 6.7 (H) 09/11/2016    Lab Results  Component Value Date   TSH 2.680 09/11/2016       Component Value Date/Time   CHOL 115 09/11/2016 1007   HDL 52 09/11/2016 1007   CHOLHDL 3.8 CALC 03/16/2007 1007   VLDL 26 03/16/2007 1007   LDLCALC 49 09/11/2016 1007    Lab Results  Component Value Date   AST 36  07/10/2016   Lab Results  Component Value Date   ALT 45 (H) 07/10/2016       Assessment & Plan:    1. Hypogonadism:     -most recent testosterone level is 172 ng/dL on 08/2016  -patient is having nasal irritation with the Natesto and would like to change treatment modalities - we discussed Clomid and AVEED as possible treatment options  - he would like a trial of Clomid if his LFT's are wnl - he will also fill out AVEED reimbursement inquiry form to see if his insurance will cover the injections as another alternative  -RTC pending  labs   2. BPH with LUTS  - IPSS score is 7/2, it is improving  - Continue conservative management, avoiding bladder irritants and timed voiding's  - RTC in 6 months for IPSS, PSA, PVR and exam, as testosterone therapy can increase prostate size and worsen LUTS  - PSA  3. Erectile dysfunction  - SHIM score is 7   - I explained to the patient that in order to achieve an erection it takes good functioning of the nervous system (parasympathetic, sympathetic, sensory and motor), good blood flow into the erectile tissue of the penis and a desire to have sex  - I explained that conditions like diabetes, hypertension, coronary artery disease, peripheral vascular disease, smoking, alcohol consumption, age, sleep apnea and BPH can diminish the ability to have an erection  - RTC in 6 months for repeat SHIM score and exam, as testosterone therapy can affect erections   Return for pending labs.  These notes generated with voice recognition software. I apologize for typographical errors.  Zara Council, [facility] [address], [place], [place] [address] [phone]

## 2017-01-21 ENCOUNTER — Encounter: Payer: Self-pay | Admitting: Urology

## 2017-01-21 ENCOUNTER — Ambulatory Visit (INDEPENDENT_AMBULATORY_CARE_PROVIDER_SITE_OTHER): Payer: 59 | Admitting: Urology

## 2017-01-21 ENCOUNTER — Encounter: Payer: Self-pay | Admitting: Family Medicine

## 2017-01-21 ENCOUNTER — Ambulatory Visit (INDEPENDENT_AMBULATORY_CARE_PROVIDER_SITE_OTHER): Payer: 59 | Admitting: Family Medicine

## 2017-01-21 VITALS — BP 120/84 | HR 72 | Temp 97.4°F | Resp 16 | Wt 203.0 lb

## 2017-01-21 VITALS — BP 134/103 | HR 69 | Ht 70.0 in | Wt 202.0 lb

## 2017-01-21 DIAGNOSIS — N138 Other obstructive and reflux uropathy: Secondary | ICD-10-CM

## 2017-01-21 DIAGNOSIS — N529 Male erectile dysfunction, unspecified: Secondary | ICD-10-CM

## 2017-01-21 DIAGNOSIS — N401 Enlarged prostate with lower urinary tract symptoms: Secondary | ICD-10-CM | POA: Diagnosis not present

## 2017-01-21 DIAGNOSIS — E291 Testicular hypofunction: Secondary | ICD-10-CM

## 2017-01-21 DIAGNOSIS — M109 Gout, unspecified: Secondary | ICD-10-CM

## 2017-01-21 DIAGNOSIS — N400 Enlarged prostate without lower urinary tract symptoms: Secondary | ICD-10-CM | POA: Diagnosis not present

## 2017-01-21 MED ORDER — ALLOPURINOL 100 MG PO TABS: 100.0000 mg | ORAL_TABLET | Freq: Every day | ORAL | 6 refills | Status: DC

## 2017-01-21 NOTE — Progress Notes (Signed)
Patient: Jeremiah [name] Male    DOB: Nov 18, 1960   56 y.o.   MRN: [id number] Visit Date: 01/21/2017  Today's Provider: [name], MD   Chief Complaint  Patient presents with  . Gout   Subjective:    HPI     Follow up for Gout  The patient was last seen for this 3 weeks ago. Changes made at last visit include adding Colchicine.  He reports excellent compliance with treatment. He feels that condition is Improved. Pt states this is 90% improved He is not having side effects.   ------------------------------------------------------------------------------------    Allergies  Allergen Reactions  . Tequin [Gatifloxacin] Anaphylaxis and Rash  . Amoxicillin Hives     Current Outpatient Prescriptions:  .  amLODipine (NORVASC) 10 MG tablet, Take 5 mg by mouth daily., Disp: , Rfl:  .  buPROPion (WELLBUTRIN SR) 150 MG 12 hr tablet, Take 1 tablet (150 mg total) by mouth daily., Disp: 90 tablet, Rfl: 1 .  colchicine 0.6 MG tablet, Take 1 tablet (0.6 mg total) by mouth 2 (two) times daily., Disp: 60 tablet, Rfl: 5 .  EPIPEN 2-PAK 0.3 MG/0.3ML SOAJ injection, , Disp: , Rfl:  .  levothyroxine (SYNTHROID, LEVOTHROID) 25 MCG tablet, Take 1 tablet (25 mcg total) by mouth daily before breakfast., Disp: 30 tablet, Rfl: 12 .  lithium carbonate 300 MG capsule, TAKE 2 CAPSULES(600 MG) BY MOUTH TWICE DAILY WITH A MEAL, Disp: 120 capsule, Rfl: 3 .  loratadine (CLARITIN) 10 MG tablet, Take 10 mg by mouth daily., Disp: , Rfl:  .  meloxicam (MOBIC) 15 MG tablet, Take by mouth., Disp: , Rfl:  .  Multiple Vitamin (MULTIVITAMIN WITH MINERALS) TABS, Take 1 tablet by mouth daily., Disp: , Rfl:  .  Testosterone (NATESTO) 5.5 MG/ACT GEL, Place 1 Act into the nose 2 (two) times daily., Disp: 7.32 g, Rfl: 5 .  traMADol (ULTRAM) 50 MG tablet, TAKE 1 TABLET BY MOUTH EVERY 8 HOURS AS NEEDED FOR PAIN, Disp: , Rfl:  .  mometasone (NASONEX) 50 MCG/ACT nasal spray, SHAKE LQ AND U 1 TO 2 SPRAYS IEN  QD, Disp: , Rfl: 6  Review of Systems  Constitutional: Positive for fatigue. Negative for activity change, appetite change, chills, diaphoresis, fever and unexpected weight change.  Eyes: Negative.   Respiratory: Negative for cough and shortness of breath.   Cardiovascular: Negative for chest pain, palpitations and leg swelling.  Endocrine: Negative.   Genitourinary: Negative.   Musculoskeletal: Positive for arthralgias.  Allergic/Immunologic: Negative.   Neurological: Negative.   Psychiatric/Behavioral: Negative.     Social History  Substance Use Topics  . Smoking status: Never Smoker  . Smokeless tobacco: Former Systems developer    Types: Chew    Quit date: 10/07/1989  . Alcohol use 3.6 - 4.8 oz/week    6 - 8 Cans of beer per week     Comment: 2 beers per day   Objective:   BP 120/84 (BP Location: Right Arm, Patient Position: Sitting, Cuff Size: Large)   Pulse 72   Temp 97.4 F (36.3 C) (Oral)   Resp 16   Wt 203 lb (92.1 kg)   BMI 29.13 kg/m  Vitals:   01/21/17 1454  BP: 120/84  Pulse: 72  Resp: 16  Temp: 97.4 F (36.3 C)  TempSrc: Oral  Weight: 203 lb (92.1 kg)     Physical Exam  Constitutional: He appears well-developed and well-nourished.  HENT:  Head: Normocephalic and atraumatic.  Right Ear: External ear normal.  Eyes: Conjunctivae are normal.  Neck: Normal range of motion.  Cardiovascular: Normal rate, regular rhythm and normal heart sounds.   Pulmonary/Chest: Effort normal and breath sounds normal. No respiratory distress.  Abdominal: Soft.  Musculoskeletal: He exhibits no edema or tenderness.  No erythremia of left great toe  Skin: Skin is warm and dry.  Psychiatric: He has a normal mood and affect. His behavior is normal. Judgment and thought content normal.        Assessment & Plan:     1. Gout involving toe, unspecified cause, unspecified chronicity, unspecified laterality Improving. Check uric acid level. Add Allopurinol as below. Recheck 2-4  weeks.Goal uric acid of less than 6. - Uric acid  -Allopurinol (ZYLOPRIM) 100 mg tablet; Take 1 tablet (100 mg) by mouth daily. Dispense: 30 tablet; Refill: 6    Patient seen and examined by Miguel Aschoff, MD, and note scribed by Renaldo Fiddler, CMA. I have done the exam and reviewed the above chart and it is accurate to the best of my knowledge. Development worker, community has been used in this note in any air is in the dictation or transcription are unintentional.  [name], MD  [place]

## 2017-01-22 ENCOUNTER — Telehealth: Payer: Self-pay

## 2017-01-22 DIAGNOSIS — E291 Testicular hypofunction: Secondary | ICD-10-CM

## 2017-01-22 LAB — HEPATIC FUNCTION PANEL
ALT: 38 IU/L (ref 0–44)
AST: 30 IU/L (ref 0–40)
Albumin: 4.3 g/dL (ref 3.5–5.5)
Alkaline Phosphatase: 68 IU/L (ref 39–117)
Bilirubin Total: 0.7 mg/dL (ref 0.0–1.2)
Bilirubin, Direct: 0.21 mg/dL (ref 0.00–0.40)
Total Protein: 7.3 g/dL (ref 6.0–8.5)

## 2017-01-22 LAB — TESTOSTERONE: Testosterone: 291 ng/dL (ref 264–916)

## 2017-01-22 LAB — PSA: Prostate Specific Ag, Serum: 0.9 ng/mL (ref 0.0–4.0)

## 2017-01-22 LAB — HEMATOCRIT: Hematocrit: 44.4 % (ref 37.5–51.0)

## 2017-01-22 MED ORDER — CLOMIPHENE CITRATE 50 MG PO TABS: ORAL_TABLET | ORAL | 0 refills | Status: DC

## 2017-01-22 NOTE — Telephone Encounter (Signed)
Spoke with pt in reference to lab results and starting clomid. Pt stated that he would like to start clomid. Therefore medication was sent to pharmacy. Made aware will need labs in 78mo. Pt stated he would call back to make appt when he has medication.

## 2017-01-22 NOTE — Telephone Encounter (Signed)
-----   Message from Nori Riis, PA-C sent at 01/22/2017  8:01 AM EDT ----- Patient's blood work looks good.  If he would like to start Clomid, we call send a script for Clomid 50 mg, 1/2 daily, # 30 and then we need a testosterone level drawn before 10AM in one month.

## 2017-01-23 DIAGNOSIS — M25561 Pain in right knee: Secondary | ICD-10-CM | POA: Diagnosis not present

## 2017-01-27 ENCOUNTER — Telehealth: Payer: Self-pay | Admitting: *Deleted

## 2017-01-27 NOTE — Telephone Encounter (Signed)
LMOM for patient to call office back. Patient has been approved for aveed injection and needs to set up the first appointment with [facility] to get injection.

## 2017-01-28 DIAGNOSIS — K4021 Bilateral inguinal hernia, without obstruction or gangrene, recurrent: Secondary | ICD-10-CM | POA: Diagnosis not present

## 2017-01-28 NOTE — Telephone Encounter (Signed)
Spoke with patient and let him know I had approval for the aveed injections. Patient states he will just stay on the medication Jeremiah [name] gave him for now and will call us back if he decides to take the aveed injections.

## 2017-01-29 ENCOUNTER — Telehealth: Payer: Self-pay | Admitting: Family Medicine

## 2017-01-29 NOTE — Telephone Encounter (Signed)
Pt states he rec'd a lab slip a week ago and pt has lost it.  Pt is requesting to pick up another one.  BW#[phone]/GO

## 2017-01-30 DIAGNOSIS — M25561 Pain in right knee: Secondary | ICD-10-CM | POA: Diagnosis not present

## 2017-01-30 NOTE — Telephone Encounter (Signed)
Lab slip placed up front and pt advised-aa

## 2017-01-31 DIAGNOSIS — J3089 Other allergic rhinitis: Secondary | ICD-10-CM | POA: Diagnosis not present

## 2017-01-31 DIAGNOSIS — J301 Allergic rhinitis due to pollen: Secondary | ICD-10-CM | POA: Diagnosis not present

## 2017-02-04 DIAGNOSIS — J3089 Other allergic rhinitis: Secondary | ICD-10-CM | POA: Diagnosis not present

## 2017-02-04 DIAGNOSIS — J301 Allergic rhinitis due to pollen: Secondary | ICD-10-CM | POA: Diagnosis not present

## 2017-02-05 DIAGNOSIS — M25561 Pain in right knee: Secondary | ICD-10-CM | POA: Diagnosis not present

## 2017-02-06 DIAGNOSIS — J3089 Other allergic rhinitis: Secondary | ICD-10-CM | POA: Diagnosis not present

## 2017-02-06 DIAGNOSIS — J301 Allergic rhinitis due to pollen: Secondary | ICD-10-CM | POA: Diagnosis not present

## 2017-02-07 DIAGNOSIS — Z96651 Presence of right artificial knee joint: Secondary | ICD-10-CM | POA: Diagnosis not present

## 2017-02-07 DIAGNOSIS — M25461 Effusion, right knee: Secondary | ICD-10-CM | POA: Diagnosis not present

## 2017-02-07 DIAGNOSIS — R937 Abnormal findings on diagnostic imaging of other parts of musculoskeletal system: Secondary | ICD-10-CM | POA: Diagnosis not present

## 2017-02-07 DIAGNOSIS — K4031 Unilateral inguinal hernia, with obstruction, without gangrene, recurrent: Secondary | ICD-10-CM | POA: Diagnosis not present

## 2017-02-07 DIAGNOSIS — I10 Essential (primary) hypertension: Secondary | ICD-10-CM | POA: Diagnosis not present

## 2017-02-11 ENCOUNTER — Ambulatory Visit: Payer: 59 | Admitting: Family Medicine

## 2017-02-14 DIAGNOSIS — J3089 Other allergic rhinitis: Secondary | ICD-10-CM | POA: Diagnosis not present

## 2017-02-14 DIAGNOSIS — J301 Allergic rhinitis due to pollen: Secondary | ICD-10-CM | POA: Diagnosis not present

## 2017-02-19 DIAGNOSIS — I10 Essential (primary) hypertension: Secondary | ICD-10-CM | POA: Diagnosis not present

## 2017-02-19 DIAGNOSIS — K4031 Unilateral inguinal hernia, with obstruction, without gangrene, recurrent: Secondary | ICD-10-CM | POA: Diagnosis not present

## 2017-02-19 DIAGNOSIS — K5900 Constipation, unspecified: Secondary | ICD-10-CM | POA: Diagnosis not present

## 2017-02-21 DIAGNOSIS — Z01818 Encounter for other preprocedural examination: Secondary | ICD-10-CM | POA: Diagnosis not present

## 2017-02-21 DIAGNOSIS — K4020 Bilateral inguinal hernia, without obstruction or gangrene, not specified as recurrent: Secondary | ICD-10-CM | POA: Diagnosis not present

## 2017-02-24 DIAGNOSIS — K4091 Unilateral inguinal hernia, without obstruction or gangrene, recurrent: Secondary | ICD-10-CM | POA: Diagnosis not present

## 2017-02-28 DIAGNOSIS — J3089 Other allergic rhinitis: Secondary | ICD-10-CM | POA: Diagnosis not present

## 2017-02-28 DIAGNOSIS — J301 Allergic rhinitis due to pollen: Secondary | ICD-10-CM | POA: Diagnosis not present

## 2017-03-12 DIAGNOSIS — E039 Hypothyroidism, unspecified: Secondary | ICD-10-CM | POA: Diagnosis not present

## 2017-03-12 DIAGNOSIS — Z09 Encounter for follow-up examination after completed treatment for conditions other than malignant neoplasm: Secondary | ICD-10-CM | POA: Diagnosis not present

## 2017-03-12 DIAGNOSIS — Z8719 Personal history of other diseases of the digestive system: Secondary | ICD-10-CM | POA: Diagnosis not present

## 2017-03-12 DIAGNOSIS — M109 Gout, unspecified: Secondary | ICD-10-CM | POA: Diagnosis not present

## 2017-03-13 DIAGNOSIS — J301 Allergic rhinitis due to pollen: Secondary | ICD-10-CM | POA: Diagnosis not present

## 2017-03-13 DIAGNOSIS — J3089 Other allergic rhinitis: Secondary | ICD-10-CM | POA: Diagnosis not present

## 2017-03-18 DIAGNOSIS — J3089 Other allergic rhinitis: Secondary | ICD-10-CM | POA: Diagnosis not present

## 2017-03-18 DIAGNOSIS — J301 Allergic rhinitis due to pollen: Secondary | ICD-10-CM | POA: Diagnosis not present

## 2017-03-20 DIAGNOSIS — J301 Allergic rhinitis due to pollen: Secondary | ICD-10-CM | POA: Diagnosis not present

## 2017-03-20 DIAGNOSIS — J3089 Other allergic rhinitis: Secondary | ICD-10-CM | POA: Diagnosis not present

## 2017-03-24 ENCOUNTER — Other Ambulatory Visit: Payer: Self-pay

## 2017-03-24 DIAGNOSIS — E291 Testicular hypofunction: Secondary | ICD-10-CM

## 2017-03-24 NOTE — Telephone Encounter (Signed)
Spoke with patient. Advised recvd refill request for Clomid, unable to refill until testosterone level checked per previous notes. Patient verbalized understanding. Scheduled lab visit.

## 2017-03-25 ENCOUNTER — Other Ambulatory Visit: Payer: Self-pay

## 2017-03-25 DIAGNOSIS — E291 Testicular hypofunction: Secondary | ICD-10-CM

## 2017-03-25 MED ORDER — CLOMIPHENE CITRATE 50 MG PO TABS: ORAL_TABLET | ORAL | 0 refills | Status: DC

## 2017-03-27 ENCOUNTER — Other Ambulatory Visit: Payer: Self-pay

## 2017-03-27 DIAGNOSIS — J3089 Other allergic rhinitis: Secondary | ICD-10-CM | POA: Diagnosis not present

## 2017-03-27 DIAGNOSIS — J301 Allergic rhinitis due to pollen: Secondary | ICD-10-CM | POA: Diagnosis not present

## 2017-04-01 ENCOUNTER — Other Ambulatory Visit: Payer: 59

## 2017-04-01 DIAGNOSIS — J301 Allergic rhinitis due to pollen: Secondary | ICD-10-CM | POA: Diagnosis not present

## 2017-04-01 DIAGNOSIS — J3089 Other allergic rhinitis: Secondary | ICD-10-CM | POA: Diagnosis not present

## 2017-04-01 DIAGNOSIS — E291 Testicular hypofunction: Secondary | ICD-10-CM

## 2017-04-02 ENCOUNTER — Other Ambulatory Visit: Payer: Self-pay | Admitting: Urology

## 2017-04-02 ENCOUNTER — Telehealth: Payer: Self-pay

## 2017-04-02 LAB — TESTOSTERONE: Testosterone: 358 ng/dL (ref 264–916)

## 2017-04-02 MED ORDER — CLOMIPHENE CITRATE 50 MG PO TABS: ORAL_TABLET | ORAL | 3 refills | Status: DC

## 2017-04-02 NOTE — Telephone Encounter (Signed)
Done

## 2017-04-02 NOTE — Telephone Encounter (Signed)
Patient called back agreed to increase to 1 tablet daily, he said that he would need another script called into the [facility] in [place]. I asked him to allow [place] the end of business today to get this done for him as we were in clinic right now seeing patient's and he said that was fine.   Thanks,  [name]

## 2017-04-02 NOTE — Telephone Encounter (Signed)
-----   Message from Nori Riis, PA-C sent at 04/02/2017  7:39 AM EDT ----- Please let Mr. Lukas know that his testosterone level has increased, but it has not quite reached therapeutic levels.  If he has been taking the Clomid 50 mg, one half tablet daily consistently, I would increase the Clomid to 1 tablet daily and repeat a morning testosterone before 10 AM and 1 month.

## 2017-04-02 NOTE — Telephone Encounter (Signed)
Called pt. No answer. Will try later. Ok to leave vmail per DPR.

## 2017-04-03 ENCOUNTER — Telehealth: Payer: Self-pay | Admitting: *Deleted

## 2017-04-03 DIAGNOSIS — E291 Testicular hypofunction: Secondary | ICD-10-CM

## 2017-04-03 NOTE — Telephone Encounter (Signed)
Called patient and made him aware that his script had been called in.  [name]

## 2017-04-03 NOTE — Telephone Encounter (Signed)
Spoke with patient and gave Shannon's message. Patient to increase Clomid to 1 tablet and labs in one month before 10 am. Patient in agreement and understands. Future lab ordered.

## 2017-04-03 NOTE — Telephone Encounter (Signed)
-----   Message from Nori Riis, PA-C sent at 04/02/2017  7:39 AM EDT ----- Please let Mr. Custis know that his testosterone level has increased, but it has not quite reached therapeutic levels.  If he has been taking the Clomid 50 mg, one half tablet daily consistently, I would increase the Clomid to 1 tablet daily and repeat a morning testosterone before 10 AM and 1 month.

## 2017-04-10 DIAGNOSIS — J3089 Other allergic rhinitis: Secondary | ICD-10-CM | POA: Diagnosis not present

## 2017-04-10 DIAGNOSIS — J301 Allergic rhinitis due to pollen: Secondary | ICD-10-CM | POA: Diagnosis not present

## 2017-04-15 DIAGNOSIS — Z791 Long term (current) use of non-steroidal anti-inflammatories (NSAID): Secondary | ICD-10-CM | POA: Diagnosis not present

## 2017-04-15 DIAGNOSIS — D649 Anemia, unspecified: Secondary | ICD-10-CM | POA: Diagnosis not present

## 2017-04-15 DIAGNOSIS — K922 Gastrointestinal hemorrhage, unspecified: Secondary | ICD-10-CM | POA: Diagnosis not present

## 2017-04-16 DIAGNOSIS — K2210 Ulcer of esophagus without bleeding: Secondary | ICD-10-CM | POA: Diagnosis not present

## 2017-04-16 DIAGNOSIS — K449 Diaphragmatic hernia without obstruction or gangrene: Secondary | ICD-10-CM | POA: Diagnosis not present

## 2017-04-16 DIAGNOSIS — K254 Chronic or unspecified gastric ulcer with hemorrhage: Secondary | ICD-10-CM | POA: Diagnosis not present

## 2017-04-16 DIAGNOSIS — K921 Melena: Secondary | ICD-10-CM | POA: Diagnosis not present

## 2017-04-16 DIAGNOSIS — Z791 Long term (current) use of non-steroidal anti-inflammatories (NSAID): Secondary | ICD-10-CM | POA: Diagnosis not present

## 2017-04-17 DIAGNOSIS — J3089 Other allergic rhinitis: Secondary | ICD-10-CM | POA: Diagnosis not present

## 2017-04-17 DIAGNOSIS — J301 Allergic rhinitis due to pollen: Secondary | ICD-10-CM | POA: Diagnosis not present

## 2017-04-21 DIAGNOSIS — J301 Allergic rhinitis due to pollen: Secondary | ICD-10-CM | POA: Diagnosis not present

## 2017-04-25 DIAGNOSIS — J3089 Other allergic rhinitis: Secondary | ICD-10-CM | POA: Diagnosis not present

## 2017-04-25 DIAGNOSIS — J301 Allergic rhinitis due to pollen: Secondary | ICD-10-CM | POA: Diagnosis not present

## 2017-05-01 DIAGNOSIS — L578 Other skin changes due to chronic exposure to nonionizing radiation: Secondary | ICD-10-CM | POA: Diagnosis not present

## 2017-05-01 DIAGNOSIS — L821 Other seborrheic keratosis: Secondary | ICD-10-CM | POA: Diagnosis not present

## 2017-05-01 DIAGNOSIS — L812 Freckles: Secondary | ICD-10-CM | POA: Diagnosis not present

## 2017-05-01 DIAGNOSIS — L820 Inflamed seborrheic keratosis: Secondary | ICD-10-CM | POA: Diagnosis not present

## 2017-05-02 ENCOUNTER — Other Ambulatory Visit: Payer: 59

## 2017-05-02 DIAGNOSIS — J301 Allergic rhinitis due to pollen: Secondary | ICD-10-CM | POA: Diagnosis not present

## 2017-05-02 DIAGNOSIS — E291 Testicular hypofunction: Secondary | ICD-10-CM | POA: Diagnosis not present

## 2017-05-02 DIAGNOSIS — J3089 Other allergic rhinitis: Secondary | ICD-10-CM | POA: Diagnosis not present

## 2017-05-03 LAB — TESTOSTERONE: Testosterone: 378 ng/dL (ref 264–916)

## 2017-05-05 ENCOUNTER — Telehealth: Payer: Self-pay

## 2017-05-05 NOTE — Telephone Encounter (Signed)
-----   Message from Nori Riis, PA-C sent at 05/04/2017  4:15 PM EDT ----- Please let Mr. Kutz know that his testosterone has not reached therapeutic levels.  We will need to switch to an exogenous testosterone.  Is he still interested in [place]?

## 2017-05-05 NOTE — Telephone Encounter (Signed)
Spoke with pt in reference to testosterone results and needing exogenous testosterone. Pt stated that he wants to think about what to do next as he does not like the side effects of DVTs. Pt stated that he would call back.

## 2017-05-07 ENCOUNTER — Telehealth: Payer: Self-pay

## 2017-05-07 NOTE — Telephone Encounter (Signed)
Pt called back stating he would like to try the AVEED. Please advise.

## 2017-05-07 NOTE — Telephone Encounter (Signed)
He may have filled out the AVEED form already.  Ask Ramona if she has his info.  If not, he needs to fill out an AVEED form.

## 2017-05-08 ENCOUNTER — Other Ambulatory Visit: Payer: Self-pay | Admitting: Psychiatry

## 2017-05-08 DIAGNOSIS — J301 Allergic rhinitis due to pollen: Secondary | ICD-10-CM | POA: Diagnosis not present

## 2017-05-08 DIAGNOSIS — J3089 Other allergic rhinitis: Secondary | ICD-10-CM | POA: Diagnosis not present

## 2017-05-08 NOTE — Telephone Encounter (Signed)
Correction pt will be filling out AVEED form NOT testopel.

## 2017-05-08 NOTE — Telephone Encounter (Signed)
LMOM

## 2017-05-08 NOTE — Telephone Encounter (Signed)
Spoke with pt in reference to testopel form. Pt stated that he would come by this afternoon to fill out form.

## 2017-05-14 DIAGNOSIS — M5430 Sciatica, unspecified side: Secondary | ICD-10-CM | POA: Diagnosis not present

## 2017-05-14 DIAGNOSIS — M9905 Segmental and somatic dysfunction of pelvic region: Secondary | ICD-10-CM | POA: Diagnosis not present

## 2017-05-14 DIAGNOSIS — M9901 Segmental and somatic dysfunction of cervical region: Secondary | ICD-10-CM | POA: Diagnosis not present

## 2017-05-15 DIAGNOSIS — J301 Allergic rhinitis due to pollen: Secondary | ICD-10-CM | POA: Diagnosis not present

## 2017-05-15 DIAGNOSIS — J3089 Other allergic rhinitis: Secondary | ICD-10-CM | POA: Diagnosis not present

## 2017-05-19 DIAGNOSIS — M9901 Segmental and somatic dysfunction of cervical region: Secondary | ICD-10-CM | POA: Diagnosis not present

## 2017-05-19 DIAGNOSIS — M5430 Sciatica, unspecified side: Secondary | ICD-10-CM | POA: Diagnosis not present

## 2017-05-19 DIAGNOSIS — M9905 Segmental and somatic dysfunction of pelvic region: Secondary | ICD-10-CM | POA: Diagnosis not present

## 2017-05-19 DIAGNOSIS — J3089 Other allergic rhinitis: Secondary | ICD-10-CM | POA: Diagnosis not present

## 2017-05-27 ENCOUNTER — Telehealth: Payer: Self-pay

## 2017-05-27 NOTE — Telephone Encounter (Signed)
Spoke with pt and made aware AVEED was approved by [employer]. Pt would owe 35% until out of pocket maximum was met. Pt voiced understanding. Pt stated that he would call back to make an appt.

## 2017-05-28 DIAGNOSIS — M9901 Segmental and somatic dysfunction of cervical region: Secondary | ICD-10-CM | POA: Diagnosis not present

## 2017-05-28 DIAGNOSIS — M5430 Sciatica, unspecified side: Secondary | ICD-10-CM | POA: Diagnosis not present

## 2017-05-28 DIAGNOSIS — M9905 Segmental and somatic dysfunction of pelvic region: Secondary | ICD-10-CM | POA: Diagnosis not present

## 2017-06-02 DIAGNOSIS — M5430 Sciatica, unspecified side: Secondary | ICD-10-CM | POA: Diagnosis not present

## 2017-06-02 DIAGNOSIS — M9901 Segmental and somatic dysfunction of cervical region: Secondary | ICD-10-CM | POA: Diagnosis not present

## 2017-06-02 DIAGNOSIS — M9905 Segmental and somatic dysfunction of pelvic region: Secondary | ICD-10-CM | POA: Diagnosis not present

## 2017-06-04 DIAGNOSIS — M9901 Segmental and somatic dysfunction of cervical region: Secondary | ICD-10-CM | POA: Diagnosis not present

## 2017-06-04 DIAGNOSIS — M5430 Sciatica, unspecified side: Secondary | ICD-10-CM | POA: Diagnosis not present

## 2017-06-04 DIAGNOSIS — M9905 Segmental and somatic dysfunction of pelvic region: Secondary | ICD-10-CM | POA: Diagnosis not present

## 2017-06-05 ENCOUNTER — Encounter: Payer: Self-pay | Admitting: Urology

## 2017-06-05 ENCOUNTER — Ambulatory Visit (INDEPENDENT_AMBULATORY_CARE_PROVIDER_SITE_OTHER): Payer: 59 | Admitting: Urology

## 2017-06-05 VITALS — BP 146/103 | HR 85 | Ht 70.0 in | Wt 207.6 lb

## 2017-06-05 DIAGNOSIS — J3089 Other allergic rhinitis: Secondary | ICD-10-CM | POA: Diagnosis not present

## 2017-06-05 DIAGNOSIS — E349 Endocrine disorder, unspecified: Secondary | ICD-10-CM

## 2017-06-05 DIAGNOSIS — J301 Allergic rhinitis due to pollen: Secondary | ICD-10-CM | POA: Diagnosis not present

## 2017-06-05 MED ORDER — TESTOSTERONE UNDECANOATE 750 MG/3ML IM SOLN
Freq: Once | INTRAMUSCULAR | Status: AC
  Administered 2017-06-05: 10:00:00 via INTRAMUSCULAR

## 2017-06-05 NOTE — Progress Notes (Signed)
06/05/2017 8:57 AM   Jeremiah [name] 1960-12-22 [id number]  Referring provider: Jerrol Banana., MD [address] [place], [place] [address]  Chief Complaint  Patient presents with  . Other    1ST AVEED INECTION    HPI: Patient is a 56 year old Caucasian male who presents today an AVEED for testosterone deficiency.  Testosterone deficiency Patient is experiencing a decrease in libido, a lack of energy, a decrease in strength, a decreased enjoyment in life,  sadness and/or grumpiness, erections being less strong, falling asleep after dinner and a recent deterioration in their work performance.  This is indicated by his responses to the ADAM questionnaire.  He is no longer having spontaneous erections at night.  He has sleep apnea and is not sleeping with a CPAP machine.  His pretreatment testosterone level was 208 ng/dL on 05/07/2016 and 152 ng/dL on 07/10/2016.  He had been  managing his testosterone deficiency with testosterone cypionate, Androderm and AndroGel.   He did not like the unsteady levels of the shots and his levels were not therapeutic with the topicals.  He is now using Natesto.  His prolactin level was 14.8 ng/mL on 10/06/2015.  His last testosterone level was 172 ng/mL on 08/26/2016.   He is now interested in [place].     PMH: Past Medical History:  Diagnosis Date  . Anxiety   . Arthritis    bil knees  . Bipolar disorder ([place])   . Colon polyp Dec 2015   Dr Allen Norris  . Depression    dx 2004  . Diabetes mellitus without complication ([place])    type ll  controlled by weight & diet  . Gout   . H/O hiatal hernia    had surgery for that 2012  [facility]  . Heartburn   . Hypertension   . Hypothyroidism   . Sleep apnea    dx 1.5 yr ago...study @ [facility]  . Sleep apnea    uses c pap    Surgical History: Past Surgical History:  Procedure Laterality Date  . APPENDECTOMY    . COLONOSCOPY  Dec 2015   Dr Allen Norris  . EXCISIONAL HEMORRHOIDECTOMY   01/02/15  . FOOT SURGERY     right foot 2012  . HERNIA REPAIR     Left inguinal  . HERNIA REPAIR     hiatal hernia at [place]  . MANDIBLE SURGERY     1983  . REPLACEMENT TOTAL KNEE BILATERAL     patient had knee replacement to his right side in 2015 and right 2014, Revision of R knee 12/04/16  . ROTATOR CUFF REPAIR     x 3  on right shoulder  . SHOULDER ARTHROSCOPY WITH ROTATOR CUFF REPAIR AND SUBACROMIAL DECOMPRESSION Left 03/04/2013   Procedure: LEFT SHOULDER ARTHROSCOPY WITH ROTATOR CUFF REPAIR AND SUBACROMIAL DECOMPRESSION;  Surgeon: Marin Shutter, MD;  Location: [facility];  Service: Orthopedics;  Laterality: Left;  . SHOULDER SURGERY     x 3 on left    Home Medications:  Allergies as of 06/05/2017      Reactions   Tequin [gatifloxacin] Anaphylaxis, Rash   Amoxicillin Hives      Medication List       Accurate as of 06/05/17  8:57 AM. Always use your most recent med list.          allopurinol 100 MG tablet Commonly known as:  ZYLOPRIM Take 1 tablet (100 mg total) by mouth daily.  amLODipine 10 MG tablet Commonly known as:  NORVASC Take 5 mg by mouth daily.   buPROPion 150 MG 12 hr tablet Commonly known as:  WELLBUTRIN SR Take 1 tablet (150 mg total) by mouth daily.   EPIPEN 2-PAK 0.3 mg/0.3 mL Soaj injection Generic drug:  EPINEPHrine   levothyroxine 25 MCG tablet Commonly known as:  SYNTHROID, LEVOTHROID Take 1 tablet (25 mcg total) by mouth daily before breakfast.   lithium carbonate 300 MG capsule TAKE 2 CAPSULES(600 MG) BY MOUTH TWICE DAILY WITH A MEAL   loratadine 10 MG tablet Commonly known as:  CLARITIN Take 10 mg by mouth daily.   meloxicam 15 MG tablet Commonly known as:  MOBIC Take by mouth.   mometasone 50 MCG/ACT nasal spray Commonly known as:  NASONEX SHAKE LQ AND U 1 TO 2 SPRAYS IEN QD   multivitamin with minerals Tabs tablet Take 1 tablet by mouth daily.   traMADol 50 MG tablet Commonly known as:  ULTRAM TAKE 1 TABLET BY MOUTH EVERY 8  HOURS AS NEEDED FOR PAIN       Allergies:  Allergies  Allergen Reactions  . Tequin [Gatifloxacin] Anaphylaxis and Rash  . Amoxicillin Hives    Family History: Family History  Problem Relation Age of Onset  . CAD Mother   . Arthritis Mother   . Angina Mother   . Diabetes Father   . Heart disease Father   . Colon polyps Father   . Heart attack Father   . Bipolar disorder Sister   . Asthma Sister   . Schizophrenia Sister   . Diabetes Maternal Grandmother   . Cancer Maternal Grandmother   . Heart attack Maternal Grandfather   . Stomach cancer Paternal Grandmother   . Kidney disease Neg Hx   . Prostate cancer Neg Hx     Social History:  reports that he has never smoked. He quit smokeless tobacco use about 27 years ago. His smokeless tobacco use included Chew. He reports that he drinks about 3.6 - 4.8 oz of alcohol per week . He reports that he does not use drugs.  ROS: UROLOGY Frequent Urination?: Yes Hard to postpone urination?: No Burning/pain with urination?: No Get up at night to urinate?: Yes Leakage of urine?: No Urine stream starts and stops?: No Trouble starting stream?: No Do you have to strain to urinate?: No Blood in urine?: No Urinary tract infection?: No Sexually transmitted disease?: No Injury to kidneys or bladder?: No Painful intercourse?: No Weak stream?: No Erection problems?: No Penile pain?: No  Gastrointestinal Nausea?: No Vomiting?: No Indigestion/heartburn?: No Diarrhea?: No Constipation?: No  Constitutional Fever: No Night sweats?: No Weight loss?: No Fatigue?: No  Skin Skin rash/lesions?: Yes Itching?: Yes  Eyes Blurred vision?: No Double vision?: No  Ears/Nose/Throat Sore throat?: No Sinus problems?: No  Hematologic/Lymphatic Swollen glands?: No Easy bruising?: No  Cardiovascular Leg swelling?: No Chest pain?: No  Respiratory Cough?: No Shortness of breath?: No  Endocrine Excessive thirst?:  No  Musculoskeletal Back pain?: No Joint pain?: No  Neurological Headaches?: No Dizziness?: No  Psychologic Depression?: Yes Anxiety?: No  Physical Exam: BP (!) 146/103   Pulse 85   Ht 5\' 10"  (1.778 m)   Wt 207 lb 9.6 oz (94.2 kg)   BMI 29.79 kg/m   Constitutional: Well nourished. Alert and oriented, No acute distress. HEENT: [place] AT, moist mucus membranes. Trachea midline, no masses. Cardiovascular: No clubbing, cyanosis, or edema. Respiratory: Normal respiratory effort, no increased work of breathing.  Skin: No rashes, bruises or suspicious lesions. Lymph: No cervical or inguinal adenopathy. Neurologic: Grossly intact, no focal deficits, moving all 4 extremities. Psychiatric: Normal mood and affect.  Laboratory Data: Lab Results  Component Value Date   WBC 6.3 11/12/2016   HGB 16.5 11/12/2016   HCT 44.4 01/21/2017   MCV 88 11/12/2016   PLT 217 11/12/2016    Lab Results  Component Value Date   CREATININE 1.16 11/12/2016   PSA     0.9    01/21/2017 Lab Results  Component Value Date   PSA 0.39 03/16/2007    Lab Results  Component Value Date   TESTOSTERONE 378 05/02/2017    Lab Results  Component Value Date   HGBA1C 6.7 (H) 09/11/2016    Lab Results  Component Value Date   TSH 2.680 09/11/2016       Component Value Date/Time   CHOL 115 09/11/2016 1007   HDL 52 09/11/2016 1007   CHOLHDL 3.8 CALC 03/16/2007 1007   VLDL 26 03/16/2007 1007   LDLCALC 49 09/11/2016 1007    Lab Results  Component Value Date   AST 30 01/21/2017   Lab Results  Component Value Date   ALT 38 01/21/2017    I have reviewed the labs   Assessment & Plan:    1. Testosterone deficiency  - patient received AVEED injection this am  - He was observed for 30 minutes - no signs of POME were seen  - RTC in 4 weeks for next injection    Return in about 1 month (around 07/06/2017) for AVEED injection.  These notes generated with voice recognition software. I  apologize for typographical errors.  Zara Council, [facility] [address], [place], [place] [address] [phone]

## 2017-06-05 NOTE — Progress Notes (Signed)
Aveed Injection  Due to Hypogonadism patient is present today for a Aveed Injection.  Medication: Aveed Dose: 750mg / 52ml [id number] Exp:12/19 Location: left upper outer buttocks Injection of medication was given over a 60 second period of time.  Patient tolerated well, no complications were noted Patient remained in the office for 30 minutes post injection for monitoring and will report any side effects.  Preformed by: Toniann Fail, LPN

## 2017-06-10 DIAGNOSIS — J3089 Other allergic rhinitis: Secondary | ICD-10-CM | POA: Diagnosis not present

## 2017-06-10 DIAGNOSIS — J301 Allergic rhinitis due to pollen: Secondary | ICD-10-CM | POA: Diagnosis not present

## 2017-06-11 DIAGNOSIS — M9901 Segmental and somatic dysfunction of cervical region: Secondary | ICD-10-CM | POA: Diagnosis not present

## 2017-06-11 DIAGNOSIS — M5430 Sciatica, unspecified side: Secondary | ICD-10-CM | POA: Diagnosis not present

## 2017-06-11 DIAGNOSIS — M9905 Segmental and somatic dysfunction of pelvic region: Secondary | ICD-10-CM | POA: Diagnosis not present

## 2017-06-12 DIAGNOSIS — J301 Allergic rhinitis due to pollen: Secondary | ICD-10-CM | POA: Diagnosis not present

## 2017-06-12 DIAGNOSIS — J3089 Other allergic rhinitis: Secondary | ICD-10-CM | POA: Diagnosis not present

## 2017-06-16 ENCOUNTER — Ambulatory Visit (INDEPENDENT_AMBULATORY_CARE_PROVIDER_SITE_OTHER): Payer: 59 | Admitting: Psychiatry

## 2017-06-16 ENCOUNTER — Other Ambulatory Visit: Payer: Self-pay | Admitting: Family Medicine

## 2017-06-16 ENCOUNTER — Encounter: Payer: Self-pay | Admitting: Psychiatry

## 2017-06-16 VITALS — BP 141/98 | HR 64 | Temp 98.0°F | Wt 212.4 lb

## 2017-06-16 DIAGNOSIS — F319 Bipolar disorder, unspecified: Secondary | ICD-10-CM

## 2017-06-16 DIAGNOSIS — F3130 Bipolar disorder, current episode depressed, mild or moderate severity, unspecified: Secondary | ICD-10-CM

## 2017-06-16 MED ORDER — LITHIUM CARBONATE ER 450 MG PO TBCR: 450.0000 mg | EXTENDED_RELEASE_TABLET | Freq: Every morning | ORAL | 2 refills | Status: DC

## 2017-06-16 MED ORDER — ZOLPIDEM TARTRATE 5 MG PO TABS: 5.0000 mg | ORAL_TABLET | Freq: Every evening | ORAL | 2 refills | Status: DC | PRN

## 2017-06-16 MED ORDER — BUPROPION HCL ER (SR) 150 MG PO TB12: 150.0000 mg | ORAL_TABLET | Freq: Every day | ORAL | 1 refills | Status: DC

## 2017-06-16 NOTE — Progress Notes (Signed)
Psychiatric MD Progress Note   Patient Identification: Jeremiah [name] MRN:  [id number] Date of Evaluation:  06/16/2017 Referral Source: Dr. Bridgett Larsson Chief Complaint:   Chief Complaint    Follow-up; Medication Refill     Visit Diagnosis:    ICD-9-CM ICD-10-CM   1. Bipolar I disorder, most recent episode depressed ([place]) 296.50 F31.30   2. Alcohol dependence   303.93 F10.21    Diagnosis:   Patient Active Problem List   Diagnosis Date Noted  . Chest pain [R07.9] 03/05/2016  . Pedal edema [R60.0] 03/08/2015  . History of repair of rotator cuff [Z98.890] 02/17/2014  . Diabetes ([place]) [E11.9] 08/31/2013  . BP (high blood pressure) [I10] 08/31/2013  . Adult hypothyroidism [E03.9] 08/31/2013  . Obstructive apnea [G47.33] 08/31/2013  . Clinical depression [F32.9] 08/31/2013  . Diabetes mellitus ([place]) [E11.9] 08/31/2013  . Idiopathic localized osteoarthropathy [M19.91] 05/31/2013  . ABDOMINAL PAIN, LEFT UPPER QUADRANT [R10.12] 10/04/2009  . TARSAL TUNNEL SYNDROME, RIGHT [G57.50] 09/25/2009  . ANKLE PAIN, RIGHT [M25.579] 09/25/2009  . OTHER ANKLE SPRAIN AND STRAIN [E93.810F, S96.819A] 09/25/2009  . BREAST MASS, RIGHT [N63.0] 01/07/2008  . INSOMNIA [G47.00] 01/07/2008  . GASTRIC ULCER, ACUTE [K25.3] 11/09/2007  . DUODENITIS [K29.80] 11/09/2007  . BLOOD IN STOOL, OCCULT [R19.5] 11/09/2007  . PERSONAL HISTORY OF COLONIC POLYPS [Z86.010] 11/09/2007  . BACK PAIN, LUMBAR [M54.5] 10/12/2007  . HELICOBACTER PYLORI GASTRITIS [A04.8] 09/09/2007  . GASTRIC ULCER, ACUTE, HEMORRHAGE [K25.0] 09/09/2007  . HIATAL HERNIA [K44.9] 09/09/2007  . COLONIC POLYPS, ADENOMATOUS [D12.6] 08/19/2007  . INTERNAL HEMORRHOIDS [K64.8] 08/19/2007  . EXTERNAL HEMORRHOIDS WITHOUT MENTION COMP [K64.4] 08/07/2007  . Blood in stool [K92.1] 08/07/2007  . ABDOMINAL PAIN, LEFT LOWER QUADRANT [R10.32] 08/07/2007  . SINUSITIS, ACUTE [J01.90] 06/12/2007  . ABDOMINAL PAIN [R10.9] 06/12/2007  . DIABETES MELLITUS, TYPE II  [E11.9] 03/19/2007  . GOUT [M10.9] 03/19/2007  . DISORDER, CIRCADIAN RHYTHM SLEEP, NONORGANIC [G47.20, F51.8] 03/19/2007  . Depression [F32.9] 03/19/2007  . OSTEOARTHROSIS, GENERALIZED, MULTIPLE SITES [M15.9] 03/19/2007   History of Present Illness:   Patient is a 56 -year-old married male who presented for Follow-up. He reported that heWas unable to keep his appointment as he had the knee revision surgery done. Patient reported that he has been compliant with his medications. He reported that he keeps forgetting to take the lithium at night so he has been taking 2 pills in the morning which has been making him very tired and sleepy. He wants to have the medication dose is adjusted. He reported that he is also having difficulty sleeping at night. He has taken Ambien in the past. We discussed about the medications. He appears pleasant and cooperative. He denied using any drugs or alcohol at this time. Patient reported that his medications are helping him. He stated that he wants to continue with the lithium as is been helping with his mood symptoms. He currently denied having any suicidal homicidal ideations or plans at this time.  His wife is supportive.    Past Medical History:  Past Medical History:  Diagnosis Date  . Anxiety   . Arthritis    bil knees  . Bipolar disorder ([place])   . Colon polyp Dec 2015   Dr Allen Norris  . Depression    dx 2004  . Diabetes mellitus without complication ([place])    type ll  controlled by weight & diet  . Gout   . H/O hiatal hernia    had [facility] for that 2012  [facility]  . Heartburn   .  Hypertension   . Hypothyroidism   . Sleep apnea    dx 1.5 yr ago...study @ [place]  . Sleep apnea    uses c pap    Past Surgical History:  Procedure Laterality Date  . APPENDECTOMY    . COLONOSCOPY  Dec 2015   Dr Allen Norris  . EXCISIONAL HEMORRHOIDECTOMY  01/02/15  . FOOT SURGERY     right foot 2012  . HERNIA REPAIR     Left inguinal  . HERNIA REPAIR     hiatal hernia  at [place]  . MANDIBLE SURGERY     1983  . REPLACEMENT TOTAL KNEE BILATERAL     patient had knee replacement to his right side in 2015 and right 2014, Revision of R knee 12/04/16  . ROTATOR CUFF REPAIR     x 3  on right shoulder  . SHOULDER ARTHROSCOPY WITH ROTATOR CUFF REPAIR AND SUBACROMIAL DECOMPRESSION Left 03/04/2013   Procedure: LEFT SHOULDER ARTHROSCOPY WITH ROTATOR CUFF REPAIR AND SUBACROMIAL DECOMPRESSION;  Surgeon: Marin Shutter, MD;  Location: [facility];  Service: Orthopedics;  Laterality: Left;  . SHOULDER SURGERY     x 3 on left   Family History:  Family History  Problem Relation Age of Onset  . CAD Mother   . Arthritis Mother   . Angina Mother   . Diabetes Father   . Heart disease Father   . Colon polyps Father   . Heart attack Father   . Bipolar disorder Sister   . Asthma Sister   . Schizophrenia Sister   . Diabetes Maternal Grandmother   . Cancer Maternal Grandmother   . Heart attack Maternal Grandfather   . Stomach cancer Paternal Grandmother   . Kidney disease Neg Hx   . Prostate cancer Neg Hx    Social History:   Social History   Social History  . Marital status: Married    Spouse name: N/A  . Number of children: N/A  . Years of education: N/A   Social History Main Topics  . Smoking status: Never Smoker  . Smokeless tobacco: Former Systems developer    Types: Chew    Quit date: 10/07/1989  . Alcohol use 3.6 - 4.8 oz/week    6 - 8 Cans of beer per week     Comment: 2 beers per day  . Drug use: No  . Sexual activity: Yes   Other Topics Concern  . None   Social History Narrative  . None   Additional Social History:  Married x 4 years. This is his second marrigae. Works at [employer] in [employer] division.  Both have children from the first marriages. Patient drinks alcohol on a regular basis. He reported that this is lite alcohol and he does not feel that it is affecting his medication.  Musculoskeletal: Strength & Muscle Tone: within normal limits Gait & Station:  normal Patient leans: N/A  Psychiatric Specialty Exam: Medication Refill   Anxiety  Symptoms include insomnia.    Insomnia     Review of Systems  Psychiatric/Behavioral: The patient has insomnia.     There were no vitals taken for this visit.There is no height or weight on file to calculate BMI.  General Appearance: Casual  Eye Contact:  Fair  Speech:  Clear and Coherent and Normal Rate  Volume:  Normal  Mood:  Anxious  Affect:  Congruent  Thought Process:  Coherent and Goal Directed  Orientation:  Full (Time, Place, and Person)  Thought Content:  WDL  Suicidal Thoughts:  No  Homicidal Thoughts:  No  Memory:  Immediate;   Fair  Judgement:  Fair  Insight:  Fair  Psychomotor Activity:  Normal  Concentration:  Fair  Recall:  [employer] [place]  Language: Fair  Akathisia:  No  Handed:  Right  AIMS (if indicated):    Assets:  Communication Skills Desire for Improvement Housing Intimacy Physical Health Social Support  ADL's:  Intact  Cognition: WNL  Sleep:  7-8   Is the patient at risk to self?  No. Has the patient been a risk to self in the past 6 months?  No. Has the patient been a risk to self within the distant past?  No. Is the patient a risk to others?  No. Has the patient been a risk to others in the past 6 months?  No. Has the patient been a risk to others within the distant past?  No.  Allergies:   Allergies  Allergen Reactions  . Tequin [Gatifloxacin] Anaphylaxis and Rash  . Amoxicillin Hives   Current Medications: Current Outpatient Prescriptions  Medication Sig Dispense Refill  . allopurinol (ZYLOPRIM) 100 MG tablet Take 1 tablet (100 mg total) by mouth daily. 30 tablet 6  . amLODipine (NORVASC) 10 MG tablet Take 5 mg by mouth daily.    [name] buPROPion (WELLBUTRIN SR) 150 MG 12 hr tablet Take 1 tablet (150 mg total) by mouth daily. 90 tablet 1  . EPIPEN 2-PAK 0.3 MG/0.3ML SOAJ injection     . levothyroxine (SYNTHROID, LEVOTHROID) 25 MCG  tablet TAKE 1 TABLET(25 MCG) BY MOUTH DAILY BEFORE BREAKFAST 30 tablet 0  . lithium carbonate 300 MG capsule TAKE 2 CAPSULES(600 MG) BY MOUTH TWICE DAILY WITH A MEAL 120 capsule 3  . loratadine (CLARITIN) 10 MG tablet Take 10 mg by mouth daily.    . meloxicam (MOBIC) 15 MG tablet Take by mouth.    . mometasone (NASONEX) 50 MCG/ACT nasal spray SHAKE LQ AND U 1 TO 2 SPRAYS IEN QD  6  . Multiple Vitamin (MULTIVITAMIN WITH MINERALS) TABS Take 1 tablet by mouth daily.    . traMADol (ULTRAM) 50 MG tablet TAKE 1 TABLET BY MOUTH EVERY 8 HOURS AS NEEDED FOR PAIN     No current facility-administered medications for this visit.     Previous Psychotropic Medications: Lithium Wellbutrin Abilify prozac zoloft celexa Depakote seroquel- could not function risperdal pristiq Lamotrigine Latuda belsorma Trazodone   Substance Abuse History in the last 12 months:  Yes.    Consequences of Substance Abuse: Negative NA  Medical Decision Making:  Review of Psycho-Social Stressors (1) and Review and summation of old records (2)  Treatment Plan Summary: Medication management     Discussed with patient at length about his medications treatment risks benefits and alternatives.  Will continue on lithium carbonate 450 mg in the morning.  Continue  Wellbutrin XL 150 mg in the morning.  He will be started on Ambien 5 mg at bedtime to help with the insomnia and he agreed with the plan.  Follow-up in 2 months   More than 50% of the time spent in psychoeducation, counseling and coordination of care.     This note was generated in part or whole with voice recognition software. Voice regonition is usually quite accurate but there are transcription errors that can and very often do occur. I apologize for any typographical errors that were not detected and corrected.    Rainey Pines, MD    9/10/20183:02  PM

## 2017-06-17 DIAGNOSIS — J301 Allergic rhinitis due to pollen: Secondary | ICD-10-CM | POA: Diagnosis not present

## 2017-06-17 DIAGNOSIS — J3089 Other allergic rhinitis: Secondary | ICD-10-CM | POA: Diagnosis not present

## 2017-06-18 DIAGNOSIS — M9905 Segmental and somatic dysfunction of pelvic region: Secondary | ICD-10-CM | POA: Diagnosis not present

## 2017-06-18 DIAGNOSIS — M9901 Segmental and somatic dysfunction of cervical region: Secondary | ICD-10-CM | POA: Diagnosis not present

## 2017-06-18 DIAGNOSIS — M5430 Sciatica, unspecified side: Secondary | ICD-10-CM | POA: Diagnosis not present

## 2017-06-20 DIAGNOSIS — J301 Allergic rhinitis due to pollen: Secondary | ICD-10-CM | POA: Diagnosis not present

## 2017-06-20 DIAGNOSIS — J3089 Other allergic rhinitis: Secondary | ICD-10-CM | POA: Diagnosis not present

## 2017-06-23 ENCOUNTER — Ambulatory Visit (INDEPENDENT_AMBULATORY_CARE_PROVIDER_SITE_OTHER): Payer: 59 | Admitting: Family Medicine

## 2017-06-23 ENCOUNTER — Encounter: Payer: Self-pay | Admitting: Family Medicine

## 2017-06-23 DIAGNOSIS — M5430 Sciatica, unspecified side: Secondary | ICD-10-CM | POA: Diagnosis not present

## 2017-06-23 DIAGNOSIS — M9901 Segmental and somatic dysfunction of cervical region: Secondary | ICD-10-CM | POA: Diagnosis not present

## 2017-06-23 DIAGNOSIS — J301 Allergic rhinitis due to pollen: Secondary | ICD-10-CM | POA: Diagnosis not present

## 2017-06-23 DIAGNOSIS — M9905 Segmental and somatic dysfunction of pelvic region: Secondary | ICD-10-CM | POA: Diagnosis not present

## 2017-06-23 MED ORDER — HYDROCODONE-HOMATROPINE 5-1.5 MG/5ML PO SYRP: ORAL_SOLUTION | ORAL | 0 refills | Status: DC

## 2017-06-23 MED ORDER — FLUTICASONE PROPIONATE 50 MCG/ACT NA SUSP: 2.0000 | Freq: Every day | NASAL | 1 refills | Status: AC

## 2017-06-23 NOTE — Progress Notes (Signed)
Subjective:     Patient ID: Jeremiah [name], male   DOB: 09/29/1961, 56 y.o.   MRN: [id number]  HPI  Chief Complaint  Patient presents with  . Cough    Patient comes in office today with complaints of cough and shortness of breath on exertion for the pat 2 days. Patient stats that he previously had a hernia LLQ of abdomen and states coughing has caused it to be painful  Patient reports taking otc Robitussin for relief.    States he has developed a tickle cough not associated with cold sx. He is concerned as coughing originally contributed to his hernia. Reports seasonal allergies including ragweed for which he gets two allergy shots weekly and loratadine.   Review of Systems     Objective:   Physical Exam  Constitutional: He appears well-developed and well-nourished. No distress.  Ears: T.M's intact without inflammation Throat: no tonsillar enlargement or exudate Neck: no cervical adenopathy Lungs: clear     Assessment:    1. Seasonal allergic rhinitis due to pollen - HYDROcodone-homatropine (HYCODAN) 5-1.5 MG/5ML syrup; 5 ml 4-6 hours as needed for cough  Dispense: 120 mL; Refill: 0 - fluticasone (FLONASE) 50 MCG/ACT nasal spray; Place 2 sprays into both nostrils daily.  Dispense: 16 g; Refill: 1    Plan:    Further f/u if not improving or new symptoms.

## 2017-06-23 NOTE — Patient Instructions (Signed)
Let us know if new symptoms or not improving. 

## 2017-06-25 DIAGNOSIS — Z96651 Presence of right artificial knee joint: Secondary | ICD-10-CM | POA: Diagnosis not present

## 2017-06-25 DIAGNOSIS — Z96652 Presence of left artificial knee joint: Secondary | ICD-10-CM | POA: Diagnosis not present

## 2017-06-27 DIAGNOSIS — J3089 Other allergic rhinitis: Secondary | ICD-10-CM | POA: Diagnosis not present

## 2017-06-27 DIAGNOSIS — J301 Allergic rhinitis due to pollen: Secondary | ICD-10-CM | POA: Diagnosis not present

## 2017-07-01 DIAGNOSIS — R933 Abnormal findings on diagnostic imaging of other parts of digestive tract: Secondary | ICD-10-CM | POA: Diagnosis not present

## 2017-07-01 DIAGNOSIS — K449 Diaphragmatic hernia without obstruction or gangrene: Secondary | ICD-10-CM | POA: Diagnosis not present

## 2017-07-01 DIAGNOSIS — R590 Localized enlarged lymph nodes: Secondary | ICD-10-CM | POA: Diagnosis not present

## 2017-07-01 DIAGNOSIS — Z23 Encounter for immunization: Secondary | ICD-10-CM | POA: Diagnosis not present

## 2017-07-01 DIAGNOSIS — K4091 Unilateral inguinal hernia, without obstruction or gangrene, recurrent: Secondary | ICD-10-CM | POA: Diagnosis not present

## 2017-07-02 DIAGNOSIS — M5430 Sciatica, unspecified side: Secondary | ICD-10-CM | POA: Diagnosis not present

## 2017-07-02 DIAGNOSIS — M9901 Segmental and somatic dysfunction of cervical region: Secondary | ICD-10-CM | POA: Diagnosis not present

## 2017-07-02 DIAGNOSIS — M9905 Segmental and somatic dysfunction of pelvic region: Secondary | ICD-10-CM | POA: Diagnosis not present

## 2017-07-03 ENCOUNTER — Ambulatory Visit (INDEPENDENT_AMBULATORY_CARE_PROVIDER_SITE_OTHER): Payer: 59

## 2017-07-03 DIAGNOSIS — E291 Testicular hypofunction: Secondary | ICD-10-CM | POA: Diagnosis not present

## 2017-07-03 MED ORDER — TESTOSTERONE UNDECANOATE 750 MG/3ML IM SOLN
750.0000 mg | Freq: Once | INTRAMUSCULAR | Status: AC
  Administered 2017-07-03: 750 mg via INTRAMUSCULAR

## 2017-07-03 NOTE — Progress Notes (Signed)
Aveed Injection  Due to Hypogonadism patient is present today for a Aveed Injection.  Medication: Aveed Dose: 750mg / 52ml [id number] Exp:12/19 Location: right upper outer buttocks Injection of medication was given over a 60 second period of time.  Patient tolerated well, no complications were noted Patient remained in the office for 30 minutes post injection for monitoring and will report any side effects.  Preformed by: Fonnie Jarvis, CMA   Additional notes/ Follow up: 6weeks

## 2017-07-08 DIAGNOSIS — J3089 Other allergic rhinitis: Secondary | ICD-10-CM | POA: Diagnosis not present

## 2017-07-08 DIAGNOSIS — J301 Allergic rhinitis due to pollen: Secondary | ICD-10-CM | POA: Diagnosis not present

## 2017-07-10 DIAGNOSIS — K4091 Unilateral inguinal hernia, without obstruction or gangrene, recurrent: Secondary | ICD-10-CM | POA: Diagnosis not present

## 2017-07-14 ENCOUNTER — Telehealth: Payer: Self-pay

## 2017-07-14 ENCOUNTER — Ambulatory Visit (INDEPENDENT_AMBULATORY_CARE_PROVIDER_SITE_OTHER): Payer: 59 | Admitting: Family Medicine

## 2017-07-14 VITALS — BP 134/86 | HR 84 | Temp 98.2°F | Resp 16 | Wt 214.0 lb

## 2017-07-14 DIAGNOSIS — M9905 Segmental and somatic dysfunction of pelvic region: Secondary | ICD-10-CM | POA: Diagnosis not present

## 2017-07-14 DIAGNOSIS — M5430 Sciatica, unspecified side: Secondary | ICD-10-CM | POA: Diagnosis not present

## 2017-07-14 DIAGNOSIS — K4091 Unilateral inguinal hernia, without obstruction or gangrene, recurrent: Secondary | ICD-10-CM | POA: Diagnosis not present

## 2017-07-14 DIAGNOSIS — M9901 Segmental and somatic dysfunction of cervical region: Secondary | ICD-10-CM | POA: Diagnosis not present

## 2017-07-14 NOTE — Progress Notes (Signed)
Jeremiah [name]  MRN: [id number] DOB: 04/24/1961  Subjective:  HPI   The patient is a 56 year old male who presents for evaluation of possible hernia.  The patient had left inguinal hernia repair on 02/24/17 at the [place].  He was doing well from the surgery until 06/28/17 when he working as an Immunologist at a [employer].  He was walking fast and noticed a pain in the area of the repair.  He ended up leaving at halftime to go home and then iced the area that day.  The next day he noticed he had a protrusion at the site.  He was seen at the [facility] ED 4 days later and they made an appointment for him to see his primary down there about 10 days later.  The ED did a CT and they said there was either some mesh protruding through or fatty tissue going through the mesh.  His primary at the [place] felt he had another hernia in the same spot ans scheduled surgery for 08/07/17.   The patient is reluctant to go back to the [place].  He states he had knee surgery down there and then had complications and had to have a second surgery to replace the first knee replacement.  Now he is facing another surgery again on the same hernia and does not have the confidence in the [place] The patient would like a referral to [facility] on [facility] [address] for a second opinion and possibly have them do his surgery.  Patient Active Problem List   Diagnosis Date Noted  . Allergic rhinitis due to pollen 06/23/2017  . Chest pain 03/05/2016  . Pedal edema 03/08/2015  . History of repair of rotator cuff 02/17/2014  . Diabetes ([place]) 08/31/2013  . BP (high blood pressure) 08/31/2013  . Adult hypothyroidism 08/31/2013  . Obstructive apnea 08/31/2013  . Clinical depression 08/31/2013  . Diabetes mellitus ([place]) 08/31/2013  . Idiopathic localized osteoarthropathy 05/31/2013  . ABDOMINAL PAIN, LEFT UPPER QUADRANT 10/04/2009  . TARSAL TUNNEL SYNDROME, RIGHT 09/25/2009  . ANKLE PAIN, RIGHT 09/25/2009  . OTHER ANKLE SPRAIN AND STRAIN  09/25/2009  . BREAST MASS, RIGHT 01/07/2008  . INSOMNIA 01/07/2008  . GASTRIC ULCER, ACUTE 11/09/2007  . DUODENITIS 11/09/2007  . BLOOD IN STOOL, OCCULT 11/09/2007  . PERSONAL HISTORY OF COLONIC POLYPS 11/09/2007  . BACK PAIN, LUMBAR 10/12/2007  . HELICOBACTER PYLORI GASTRITIS 09/09/2007  . GASTRIC ULCER, ACUTE, HEMORRHAGE 09/09/2007  . HIATAL HERNIA 09/09/2007  . COLONIC POLYPS, ADENOMATOUS 08/19/2007  . INTERNAL HEMORRHOIDS 08/19/2007  . EXTERNAL HEMORRHOIDS WITHOUT MENTION COMP 08/07/2007  . Blood in stool 08/07/2007  . ABDOMINAL PAIN, LEFT LOWER QUADRANT 08/07/2007  . SINUSITIS, ACUTE 06/12/2007  . ABDOMINAL PAIN 06/12/2007  . DIABETES MELLITUS, TYPE II 03/19/2007  . GOUT 03/19/2007  . DISORDER, CIRCADIAN RHYTHM SLEEP, NONORGANIC 03/19/2007  . Depression 03/19/2007  . OSTEOARTHROSIS, GENERALIZED, MULTIPLE SITES 03/19/2007    Past Medical History:  Diagnosis Date  . Anxiety   . Arthritis    bil knees  . Bipolar disorder ([place])   . Colon polyp Dec 2015   Dr Allen Norris  . Depression    dx 2004  . Diabetes mellitus without complication ([place])    type ll  controlled by weight & diet  . Gout   . H/O hiatal hernia    had surgery for that 2012  [facility]  . Heartburn   . Hypertension   . Hypothyroidism   . Sleep apnea  dx 1.5 yr ago...study @ [place]  . Sleep apnea    uses c pap    Social History   Social History  . Marital status: Married    Spouse name: N/A  . Number of children: N/A  . Years of education: N/A   Occupational History  . Not on file.   Social History Main Topics  . Smoking status: Never Smoker  . Smokeless tobacco: Former Systems developer    Types: Chew    Quit date: 10/07/1989  . Alcohol use 3.6 - 4.8 oz/week    6 - 8 Cans of beer per week     Comment: 2 beers per day  . Drug use: No  . Sexual activity: Yes   Other Topics Concern  . Not on file   Social History Narrative  . No narrative on file    Outpatient Encounter Prescriptions as of  07/14/2017  Medication Sig Note  . allopurinol (ZYLOPRIM) 100 MG tablet Take 1 tablet (100 mg total) by mouth daily.   [name] amLODipine (NORVASC) 10 MG tablet Take 5 mg by mouth daily.   [name] buPROPion (WELLBUTRIN SR) 150 MG 12 hr tablet Take 1 tablet (150 mg total) by mouth daily.   [name] EPIPEN 2-PAK 0.3 MG/0.3ML SOAJ injection  11/17/2015: Received from: External Pharmacy  . fluticasone (FLONASE) 50 MCG/ACT nasal spray Place 2 sprays into both nostrils daily.   [name] levothyroxine (SYNTHROID, LEVOTHROID) 25 MCG tablet TAKE 1 TABLET(25 MCG) BY MOUTH DAILY BEFORE BREAKFAST   . lithium carbonate (ESKALITH) 450 MG CR tablet Take 1 tablet (450 mg total) by mouth every morning.   . loratadine (CLARITIN) 10 MG tablet Take 10 mg by mouth daily.   . Multiple Vitamin (MULTIVITAMIN WITH MINERALS) TABS Take 1 tablet by mouth daily.   [name] zolpidem (AMBIEN) 5 MG tablet Take 1 tablet (5 mg total) by mouth at bedtime as needed for sleep.   . [DISCONTINUED] clindamycin (CLEOCIN) 300 MG capsule    . [DISCONTINUED] HYDROcodone-homatropine (HYCODAN) 5-1.5 MG/5ML syrup 5 ml 4-6 hours as needed for cough    No facility-administered encounter medications on file as of 07/14/2017.     Allergies  Allergen Reactions  . Tequin [Gatifloxacin] Anaphylaxis and Rash  . Amoxicillin Hives    Review of Systems  Constitutional: Negative for fever and malaise/fatigue.  Respiratory: Negative for cough, shortness of breath and wheezing.   Cardiovascular: Negative for chest pain, palpitations, orthopnea, claudication and leg swelling.  Gastrointestinal: Negative for abdominal pain, blood in stool, constipation, diarrhea, heartburn, nausea and vomiting.       Left inguinal area pain with bulging at times.  Now it is mostly when he coughs.  Neurological: Negative for weakness.    Objective:  BP 134/86 (BP Location: Right Arm, Patient Position: Sitting, Cuff Size: Normal)   Pulse 84   Temp 98.2 F (36.8 C) (Oral)   Resp 16   Wt 214 lb  (97.1 kg)   BMI 30.71 kg/m   Physical Exam  Constitutional: He is well-developed, well-nourished, and in no distress.  HENT:  Head: Normocephalic and atraumatic.  Eyes: Pupils are equal, round, and reactive to light. Conjunctivae are normal.  Neck: Normal range of motion.  Cardiovascular: Normal rate, regular rhythm and normal heart sounds.   Pulmonary/Chest: Effort normal and breath sounds normal.  Genitourinary:  Genitourinary Comments: Left inguinal hernia present    Assessment and Plan :   1. Unilateral recurrent inguinal hernia without obstruction or gangrene Left inguinal hernia  after left inguinal hernia repair.  Will refer to surgery   - Ambulatory referral to General Surgery Dr. Zenaida Niece with [facility]  HPI, Exam and A&P Transcribed under the direction and in the presence of [name]., MD. Electronically Signed: Althea Charon, RMA I have done the exam and reviewed the chart and it is accurate to the best of my knowledge. Development worker, community has been used and  any errors in dictation or transcription are unintentional. Miguel Aschoff M.D. [facility]

## 2017-07-14 NOTE — Telephone Encounter (Signed)
Pt was just seen for a possible Hernia.  He was wanted to know if there are any lifting restrictions?  Contact number is [phone] (Can leave a detailed message.)  Thanks,   -Mickel Baas

## 2017-07-14 NOTE — Telephone Encounter (Signed)
Patient advised to not be doing any heavy lifting.

## 2017-07-16 DIAGNOSIS — M5430 Sciatica, unspecified side: Secondary | ICD-10-CM | POA: Diagnosis not present

## 2017-07-16 DIAGNOSIS — M9901 Segmental and somatic dysfunction of cervical region: Secondary | ICD-10-CM | POA: Diagnosis not present

## 2017-07-16 DIAGNOSIS — M9905 Segmental and somatic dysfunction of pelvic region: Secondary | ICD-10-CM | POA: Diagnosis not present

## 2017-07-17 DIAGNOSIS — J3089 Other allergic rhinitis: Secondary | ICD-10-CM | POA: Diagnosis not present

## 2017-07-17 DIAGNOSIS — J301 Allergic rhinitis due to pollen: Secondary | ICD-10-CM | POA: Diagnosis not present

## 2017-07-21 ENCOUNTER — Encounter: Payer: Self-pay | Admitting: Family Medicine

## 2017-07-23 DIAGNOSIS — K4091 Unilateral inguinal hernia, without obstruction or gangrene, recurrent: Secondary | ICD-10-CM | POA: Diagnosis not present

## 2017-07-23 DIAGNOSIS — M9901 Segmental and somatic dysfunction of cervical region: Secondary | ICD-10-CM | POA: Diagnosis not present

## 2017-07-23 DIAGNOSIS — M5430 Sciatica, unspecified side: Secondary | ICD-10-CM | POA: Diagnosis not present

## 2017-07-23 DIAGNOSIS — M9905 Segmental and somatic dysfunction of pelvic region: Secondary | ICD-10-CM | POA: Diagnosis not present

## 2017-07-25 DIAGNOSIS — J301 Allergic rhinitis due to pollen: Secondary | ICD-10-CM | POA: Diagnosis not present

## 2017-07-25 DIAGNOSIS — J3089 Other allergic rhinitis: Secondary | ICD-10-CM | POA: Diagnosis not present

## 2017-07-29 DIAGNOSIS — J301 Allergic rhinitis due to pollen: Secondary | ICD-10-CM | POA: Diagnosis not present

## 2017-07-29 DIAGNOSIS — J3089 Other allergic rhinitis: Secondary | ICD-10-CM | POA: Diagnosis not present

## 2017-07-30 DIAGNOSIS — M5430 Sciatica, unspecified side: Secondary | ICD-10-CM | POA: Diagnosis not present

## 2017-07-30 DIAGNOSIS — M9901 Segmental and somatic dysfunction of cervical region: Secondary | ICD-10-CM | POA: Diagnosis not present

## 2017-07-30 DIAGNOSIS — M9905 Segmental and somatic dysfunction of pelvic region: Secondary | ICD-10-CM | POA: Diagnosis not present

## 2017-07-31 ENCOUNTER — Encounter: Payer: Self-pay | Admitting: Physician Assistant

## 2017-07-31 ENCOUNTER — Ambulatory Visit (INDEPENDENT_AMBULATORY_CARE_PROVIDER_SITE_OTHER): Payer: 59 | Admitting: Physician Assistant

## 2017-07-31 VITALS — BP 128/80 | HR 68 | Temp 97.7°F | Resp 16 | Wt 210.0 lb

## 2017-07-31 DIAGNOSIS — M545 Low back pain, unspecified: Secondary | ICD-10-CM

## 2017-07-31 MED ORDER — CYCLOBENZAPRINE HCL 10 MG PO TABS: 10.0000 mg | ORAL_TABLET | Freq: Every day | ORAL | 0 refills | Status: DC

## 2017-07-31 NOTE — Patient Instructions (Signed)
Low Back Sprain  A sprain is a stretch or tear in the bands of tissue that hold bones and joints together (ligaments). Sprains of the lower back (lumbar spine) are a common cause of low back pain. A sprain occurs when ligaments are overextended or stretched beyond their limits. The ligaments can become inflamed, resulting in pain and sudden muscle tightening (spasms). A sprain can be caused by an injury (trauma), or it can develop gradually due to overuse.  There are three types of sprains:  · Grade 1 is a mild sprain involving an overstretched ligament or a very slight tear of the ligament.  · Grade 2 is a moderate sprain involving a partial tear of the ligament.  · Grade 3 is a severe sprain involving a complete tear of the ligament.    What are the causes?  This condition may be caused by:  · Trauma, such as a fall or a hit to the body.  · Twisting or overstretching the back. This may result from doing activities that require a lot of energy, such as lifting heavy objects.    What increases the risk?  The following factors may increase your risk of getting this condition:  · Playing contact sports.  · Participating in sports or activities that put excessive stress on the back and require a lot of bending and twisting, including:  ? Lifting weights or heavy objects.  ? Gymnastics.  ? Soccer.  ? Figure skating.  ? Snowboarding.  · Being overweight or obese.  · Having poor strength and flexibility.    What are the signs or symptoms?  Symptoms of this condition may include:  · Sharp or dull pain in the lower back that does not go away. Pain may extend to the buttocks.  · Stiffness.  · Limited range of motion.  · Inability to stand up straight due to stiffness or pain.  · Muscle spasms.    How is this diagnosed?    This condition may be diagnosed based on:  · Your symptoms.  · Your medical history.  · A physical exam.  ? Your health care provider may push on certain areas of your back to determine the source of your  pain.  ? You may be asked to bend forward, backward, and side to side to assess the severity of your pain and your range of motion.  · Imaging tests, such as:  ? X-rays.  ? MRI.    How is this treated?  Treatment for this condition may include:  · Applying heat and cold to the affected area.  · Medicines to help relieve pain and to relax your muscles (muscle relaxants).  · NSAIDs to help reduce swelling and discomfort.  · Physical therapy.    When your symptoms improve, it is important to gradually return to your normal routine as soon as possible to reduce pain, avoid stiffness, and avoid loss of muscle strength. Generally, symptoms should improve within 6 weeks of treatment. However, recovery time varies.  Follow these instructions at home:  Managing pain, stiffness, and swelling  · If directed, apply ice to the injured area during the first 24 hours after your injury.  ? Put ice in a plastic bag.  ? Place a towel between your skin and the bag.  ? Leave the ice on for 20 minutes, 2-3 times a day.  · If directed, apply heat to the affected area as often as told by your health care provider. Use the   heat source that your health care provider recommends, such as a moist heat pack or a heating pad.  ? Place a towel between your skin and the heat source.  ? Leave the heat on for 20-30 minutes.  ? Remove the heat if your skin turns bright red. This is especially important if you are unable to feel pain, heat, or cold. You may have a greater risk of getting burned.  Activity  · Rest and return to your normal activities as told by your health care provider. Ask your health care provider what activities are safe for you.  · Avoid activities that take a lot of effort (are strenuous) for as long as told by your health care provider.  · Do exercises as told by your health care provider.  General instructions    · Take over-the-counter and prescription medicines only as told by your health care provider.  · If you have  questions or concerns about safety while taking pain medicine, talk with your health care provider.  · Do not drive or operate heavy machinery until you know how your pain medicine affects you.  · Do not use any tobacco products, such as cigarettes, chewing tobacco, and e-cigarettes. Tobacco can delay bone healing. If you need help quitting, ask your health care provider.  · Keep all follow-up visits as told by your health care provider. This is important.  How is this prevented?  · Warm up and stretch before being active.  · Cool down and stretch after being active.  · Give your body time to rest between periods of activity.  · Avoid:  ? Being physically inactive for long periods at a time.  ? Exercising or playing sports when you are tired or in pain.  · Use correct form when playing sports and lifting heavy objects.  · Use good posture when sitting and standing.  · Maintain a healthy weight.  · Sleep on a mattress with medium firmness to support your back.  · Make sure to use equipment that fits you, including shoes that fit well.  · Be safe and responsible while being active to avoid falls.  · Do at least 150 minutes of moderate-intensity exercise each week, such as brisk walking or water aerobics. Try a form of exercise that takes stress off your back, such as swimming or stationary cycling.  · Maintain physical fitness, including:  ? Strength. In particular, develop and maintain strong abdominal muscles.  ? Flexibility.  ? Cardiovascular fitness.  ? Endurance.  Contact a health care provider if:  · Your back pain does not improve after 6 weeks of treatment.  · Your symptoms get worse.  Get help right away if:  · Your back pain is severe.  · You are unable to stand or walk.  · You develop pain in your legs.  · You develop weakness in your buttocks or legs.  · You have difficulty controlling when you urinate or when you have a bowel movement.  This information is not intended to replace advice given to you by  your health care provider. Make sure you discuss any questions you have with your health care provider.  Document Released: 09/23/2005 Document Revised: 05/30/2016 Document Reviewed: 07/05/2015  Elsevier Interactive Patient Education © 2018 [employer] Inc[employer]

## 2017-07-31 NOTE — Progress Notes (Signed)
Patient: Jeremiah [name] Male    DOB: 01-30-1961   56 y.o.   MRN: [id number] Visit Date: 07/31/2017  Today's Provider: Trinna Post, PA-C   Chief Complaint  Patient presents with  . Back Pain    Chronic issue, but a recent flare for over a week.    Subjective:    Jeremiah [name] is a 56 y/o man with history of lumbar osteoarthritis, chronic back pain, previous gastric ulcer presenting today with flare of his back pain ongoing x 1 week. He reports no injuries, just low back pain especially when moving positions. Pain does not radiate, no numbness, tingling, or weakness. No urinary symptoms, no bowel symptoms. Gait normal.   Back Pain  This is a chronic problem. The problem has been gradually worsening (Recent flare for one week.) since onset. The pain is present in the lumbar spine. The pain does not radiate. The pain is at a severity of 5/10. The pain is the same all the time. The symptoms are aggravated by sitting, standing and position. Pertinent negatives include no abdominal pain, bladder incontinence, bowel incontinence, chest pain, dysuria, fever, headaches, leg pain, numbness, paresis, paresthesias, perianal numbness, tingling, weakness or weight loss. He has tried NSAIDs for the symptoms. The treatment provided no relief.       Allergies  Allergen Reactions  . Tequin [Gatifloxacin] Anaphylaxis and Rash  . Amoxicillin Hives     Current Outpatient Prescriptions:  .  allopurinol (ZYLOPRIM) 100 MG tablet, Take 1 tablet (100 mg total) by mouth daily., Disp: 30 tablet, Rfl: 6 .  amLODipine (NORVASC) 10 MG tablet, Take 5 mg by mouth daily., Disp: , Rfl:  .  buPROPion (WELLBUTRIN SR) 150 MG 12 hr tablet, Take 1 tablet (150 mg total) by mouth daily., Disp: 90 tablet, Rfl: 1 .  EPIPEN 2-PAK 0.3 MG/0.3ML SOAJ injection, , Disp: , Rfl:  .  fluticasone (FLONASE) 50 MCG/ACT nasal spray, Place 2 sprays into both nostrils daily., Disp: 16 g, Rfl: 1 .  levothyroxine (SYNTHROID,  LEVOTHROID) 25 MCG tablet, TAKE 1 TABLET(25 MCG) BY MOUTH DAILY BEFORE BREAKFAST, Disp: 30 tablet, Rfl: 0 .  lithium carbonate (ESKALITH) 450 MG CR tablet, Take 1 tablet (450 mg total) by mouth every morning., Disp: 30 tablet, Rfl: 2 .  loratadine (CLARITIN) 10 MG tablet, Take 10 mg by mouth daily., Disp: , Rfl:  .  Multiple Vitamin (MULTIVITAMIN WITH MINERALS) TABS, Take 1 tablet by mouth daily., Disp: , Rfl:  .  zolpidem (AMBIEN) 5 MG tablet, Take 1 tablet (5 mg total) by mouth at bedtime as needed for sleep., Disp: 30 tablet, Rfl: 2  Review of Systems  Constitutional: Negative.  Negative for fever and weight loss.  Cardiovascular: Negative for chest pain.  Gastrointestinal: Negative.  Negative for abdominal pain and bowel incontinence.  Genitourinary: Negative for bladder incontinence and dysuria.  Musculoskeletal: Positive for back pain. Negative for arthralgias, gait problem, joint swelling, myalgias, neck pain and neck stiffness.  Neurological: Negative for tingling, weakness, numbness, headaches and paresthesias.    Social History  Substance Use Topics  . Smoking status: Never Smoker  . Smokeless tobacco: Former Systems developer    Types: Chew    Quit date: 10/07/1989  . Alcohol use 3.6 - 4.8 oz/week    6 - 8 Cans of beer per week     Comment: 2 beers per day   Objective:   BP 128/80 (BP Location: Left Arm, Patient Position: Sitting, Cuff  Size: Normal)   Pulse 68   Temp 97.7 F (36.5 C) (Oral)   Resp 16   Wt 210 lb (95.3 kg)   BMI 30.13 kg/m  Vitals:   07/31/17 1359  BP: 128/80  Pulse: 68  Resp: 16  Temp: 97.7 F (36.5 C)  TempSrc: Oral  Weight: 210 lb (95.3 kg)     Physical Exam  Constitutional: He is oriented to person, place, and time. He appears well-developed and well-nourished.  Musculoskeletal: He exhibits no edema, tenderness or deformity.       Lumbar back: He exhibits decreased range of motion. He exhibits no tenderness, no bony tenderness, no swelling, no edema,  no deformity, no laceration, no pain, no spasm and normal pulse.  Neurological: He is alert and oriented to person, place, and time.  Skin: Skin is warm and dry.  Psychiatric: He has a normal mood and affect. His behavior is normal.        Assessment & Plan:     1. Right-sided low back pain without sciatica, unspecified chronicity  History of gastric ulcer, no NSAIDs. Can use muscle relaxer and tylenol.   - cyclobenzaprine (FLEXERIL) 10 MG tablet; Take 1 tablet (10 mg total) by mouth at bedtime.  Dispense: 14 tablet; Refill: 0  Return if symptoms worsen or fail to improve.  The entirety of the information documented in the History of Present Illness, Review of Systems and Physical Exam were personally obtained by me. Portions of this information were initially documented by Ashley Royalty, CMA and reviewed by me for thoroughness and accuracy.        Trinna Post, PA-C  [facility]

## 2017-08-01 DIAGNOSIS — J3089 Other allergic rhinitis: Secondary | ICD-10-CM | POA: Diagnosis not present

## 2017-08-04 DIAGNOSIS — M9905 Segmental and somatic dysfunction of pelvic region: Secondary | ICD-10-CM | POA: Diagnosis not present

## 2017-08-04 DIAGNOSIS — M9901 Segmental and somatic dysfunction of cervical region: Secondary | ICD-10-CM | POA: Diagnosis not present

## 2017-08-04 DIAGNOSIS — M5430 Sciatica, unspecified side: Secondary | ICD-10-CM | POA: Diagnosis not present

## 2017-08-05 DIAGNOSIS — J3089 Other allergic rhinitis: Secondary | ICD-10-CM | POA: Diagnosis not present

## 2017-08-05 DIAGNOSIS — J301 Allergic rhinitis due to pollen: Secondary | ICD-10-CM | POA: Diagnosis not present

## 2017-08-06 DIAGNOSIS — J301 Allergic rhinitis due to pollen: Secondary | ICD-10-CM | POA: Diagnosis not present

## 2017-08-06 DIAGNOSIS — K219 Gastro-esophageal reflux disease without esophagitis: Secondary | ICD-10-CM | POA: Diagnosis not present

## 2017-08-06 DIAGNOSIS — R05 Cough: Secondary | ICD-10-CM | POA: Diagnosis not present

## 2017-08-11 DIAGNOSIS — M9901 Segmental and somatic dysfunction of cervical region: Secondary | ICD-10-CM | POA: Diagnosis not present

## 2017-08-11 DIAGNOSIS — M9905 Segmental and somatic dysfunction of pelvic region: Secondary | ICD-10-CM | POA: Diagnosis not present

## 2017-08-11 DIAGNOSIS — M5430 Sciatica, unspecified side: Secondary | ICD-10-CM | POA: Diagnosis not present

## 2017-08-12 DIAGNOSIS — J3089 Other allergic rhinitis: Secondary | ICD-10-CM | POA: Diagnosis not present

## 2017-08-12 DIAGNOSIS — J301 Allergic rhinitis due to pollen: Secondary | ICD-10-CM | POA: Diagnosis not present

## 2017-08-13 DIAGNOSIS — M5430 Sciatica, unspecified side: Secondary | ICD-10-CM | POA: Diagnosis not present

## 2017-08-13 DIAGNOSIS — M9901 Segmental and somatic dysfunction of cervical region: Secondary | ICD-10-CM | POA: Diagnosis not present

## 2017-08-13 DIAGNOSIS — M9905 Segmental and somatic dysfunction of pelvic region: Secondary | ICD-10-CM | POA: Diagnosis not present

## 2017-08-18 ENCOUNTER — Encounter: Payer: Self-pay | Admitting: Psychiatry

## 2017-08-18 ENCOUNTER — Ambulatory Visit: Payer: 59 | Admitting: Psychiatry

## 2017-08-18 DIAGNOSIS — M9905 Segmental and somatic dysfunction of pelvic region: Secondary | ICD-10-CM | POA: Diagnosis not present

## 2017-08-18 DIAGNOSIS — F3130 Bipolar disorder, current episode depressed, mild or moderate severity, unspecified: Secondary | ICD-10-CM

## 2017-08-18 DIAGNOSIS — F319 Bipolar disorder, unspecified: Secondary | ICD-10-CM

## 2017-08-18 DIAGNOSIS — M9901 Segmental and somatic dysfunction of cervical region: Secondary | ICD-10-CM | POA: Diagnosis not present

## 2017-08-18 DIAGNOSIS — M5430 Sciatica, unspecified side: Secondary | ICD-10-CM | POA: Diagnosis not present

## 2017-08-18 MED ORDER — BUPROPION HCL ER (SR) 150 MG PO TB12: 150.0000 mg | ORAL_TABLET | Freq: Every day | ORAL | 1 refills | Status: DC

## 2017-08-18 MED ORDER — ZOLPIDEM TARTRATE 5 MG PO TABS: 5.0000 mg | ORAL_TABLET | Freq: Every evening | ORAL | 2 refills | Status: DC | PRN

## 2017-08-18 MED ORDER — LITHIUM CARBONATE ER 450 MG PO TBCR: 450.0000 mg | EXTENDED_RELEASE_TABLET | Freq: Every morning | ORAL | 2 refills | Status: DC

## 2017-08-18 NOTE — Progress Notes (Signed)
Psychiatric MD Progress Note   Patient Identification: Jeremiah [name] MRN:  [id number] Date of Evaluation:  08/18/2017 Referral Source: Dr. Bridgett Larsson Chief Complaint:    Visit Diagnosis:    ICD-9-CM ICD-10-CM   1. Bipolar I disorder, most recent episode depressed ([place]) 296.50 F31.30   2. Alcohol dependence   303.93 F10.21    Diagnosis:   Patient Active Problem List   Diagnosis Date Noted  . Allergic rhinitis due to pollen [J30.1] 06/23/2017  . Chest pain [R07.9] 03/05/2016  . Pedal edema [R60.0] 03/08/2015  . History of repair of rotator cuff [Z98.890] 02/17/2014  . Diabetes ([place]) [E11.9] 08/31/2013  . BP (high blood pressure) [I10] 08/31/2013  . Adult hypothyroidism [E03.9] 08/31/2013  . Obstructive apnea [G47.33] 08/31/2013  . Clinical depression [F32.9] 08/31/2013  . Diabetes mellitus ([place]) [E11.9] 08/31/2013  . Idiopathic localized osteoarthropathy [M19.91] 05/31/2013  . ABDOMINAL PAIN, LEFT UPPER QUADRANT [R10.12] 10/04/2009  . TARSAL TUNNEL SYNDROME, RIGHT [G57.50] 09/25/2009  . ANKLE PAIN, RIGHT [M25.579] 09/25/2009  . OTHER ANKLE SPRAIN AND STRAIN [Q73.419F, S96.819A] 09/25/2009  . BREAST MASS, RIGHT [N63.0] 01/07/2008  . INSOMNIA [G47.00] 01/07/2008  . GASTRIC ULCER, ACUTE [K25.3] 11/09/2007  . DUODENITIS [K29.80] 11/09/2007  . BLOOD IN STOOL, OCCULT [R19.5] 11/09/2007  . PERSONAL HISTORY OF COLONIC POLYPS [Z86.010] 11/09/2007  . BACK PAIN, LUMBAR [M54.5] 10/12/2007  . HELICOBACTER PYLORI GASTRITIS [A04.8] 09/09/2007  . GASTRIC ULCER, ACUTE, HEMORRHAGE [K25.0] 09/09/2007  . HIATAL HERNIA [K44.9] 09/09/2007  . COLONIC POLYPS, ADENOMATOUS [D12.6] 08/19/2007  . INTERNAL HEMORRHOIDS [K64.8] 08/19/2007  . EXTERNAL HEMORRHOIDS WITHOUT MENTION COMP [K64.4] 08/07/2007  . Blood in stool [K92.1] 08/07/2007  . ABDOMINAL PAIN, LEFT LOWER QUADRANT [R10.32] 08/07/2007  . SINUSITIS, ACUTE [J01.90] 06/12/2007  . ABDOMINAL PAIN [R10.9] 06/12/2007  . DIABETES MELLITUS, TYPE II  [E11.9] 03/19/2007  . GOUT [M10.9] 03/19/2007  . DISORDER, CIRCADIAN RHYTHM SLEEP, NONORGANIC [G47.20, F51.8] 03/19/2007  . Depression [F32.9] 03/19/2007  . OSTEOARTHROSIS, GENERALIZED, MULTIPLE SITES [M15.9] 03/19/2007   History of Present Illness:   Patient is a 55 -year-old married male who presented for Follow-up. He reported that he is coming from her doctor's appointment as he has been seeing a chiropractor this morning. He reported that he has been having problems in his back. Patient reported that he is doing well on his current medications. He reported that he feels that the current combination of the medications has been helping him. He stated that he does not feel tired or sleepy. He reported that he is planning to spend time with his wife over the holidays as she has been having any anemia  and feels tired most of the time. He reported that he takes Ambien on a when necessary basis to help him sleep at night. He does not have any acute issues at this time. He is also planning to have his physical exam And will have his labs done at that time. He appears pleasant and cooperative during the interview.    He currently denied having any suicidal homicidal ideations or plans at this time.  His wife is supportive.    Past Medical History:  Past Medical History:  Diagnosis Date  . Anxiety   . Arthritis    bil knees  . Bipolar disorder ([place])   . Colon polyp Dec 2015   Dr Allen Norris  . Depression    dx 2004  . Diabetes mellitus without complication ([place])    type ll  controlled by weight & diet  . Gout   .  H/O hiatal hernia    had surgery for that 2012  [facility]  . Heartburn   . Hypertension   . Hypothyroidism   . Sleep apnea    dx 1.5 yr ago...study @ [facility]  . Sleep apnea    uses c pap    Past Surgical History:  Procedure Laterality Date  . APPENDECTOMY    . COLONOSCOPY  Dec 2015   Dr Allen Norris  . EXCISIONAL HEMORRHOIDECTOMY  01/02/15  . FOOT SURGERY     right foot 2012  .  HERNIA REPAIR     Left inguinal  . HERNIA REPAIR     hiatal hernia at [place]  . MANDIBLE SURGERY     1983  . REPLACEMENT TOTAL KNEE BILATERAL     patient had knee replacement to his right side in 2015 and right 2014, Revision of R knee 12/04/16  . ROTATOR CUFF REPAIR     x 3  on right shoulder  . SHOULDER SURGERY     x 3 on left   Family History:  Family History  Problem Relation Age of Onset  . CAD Mother   . Arthritis Mother   . Angina Mother   . Diabetes Father   . Heart disease Father   . Colon polyps Father   . Heart attack Father   . Bipolar disorder Sister   . Asthma Sister   . Schizophrenia Sister   . Diabetes Maternal Grandmother   . Cancer Maternal Grandmother   . Heart attack Maternal Grandfather   . Stomach cancer Paternal Grandmother   . Kidney disease Neg Hx   . Prostate cancer Neg Hx    Social History:   Social History   Socioeconomic History  . Marital status: Married    Spouse name: Not on file  . Number of children: Not on file  . Years of education: Not on file  . Highest education level: Not on file  Social Needs  . Financial resource strain: Not on file  . Food insecurity - worry: Not on file  . Food insecurity - inability: Not on file  . Transportation needs - medical: Not on file  . Transportation needs - non-medical: Not on file  Occupational History  . Not on file  Tobacco Use  . Smoking status: Never Smoker  . Smokeless tobacco: Former Systems developer    Types: Chew  Substance and Sexual Activity  . Alcohol use: Yes    Alcohol/week: 3.6 - 4.8 oz    Types: 6 - 8 Cans of beer per week    Comment: 2 beers per day  . Drug use: No  . Sexual activity: Yes  Other Topics Concern  . Not on file  Social History Narrative  . Not on file   Additional Social History:  Married x 4 years. This is his second marrigae. Works at [employer] in [employer] division.  Both have children from the first marriages. Patient drinks alcohol on a regular basis. He reported that  this is lite alcohol and he does not feel that it is affecting his medication.  Musculoskeletal: Strength & Muscle Tone: within normal limits Gait & Station: normal Patient leans: N/A  Psychiatric Specialty Exam: Medication Refill   Anxiety  Symptoms include insomnia.    Insomnia     Review of Systems  Psychiatric/Behavioral: The patient has insomnia.     There were no vitals taken for this visit.There is no height or weight on file to calculate BMI.  General Appearance: Casual  Eye Contact:  Fair  Speech:  Clear and Coherent and Normal Rate  Volume:  Normal  Mood:  Anxious  Affect:  Congruent  Thought Process:  Coherent and Goal Directed  Orientation:  Full (Time, Place, and Person)  Thought Content:  WDL  Suicidal Thoughts:  No  Homicidal Thoughts:  No  Memory:  Immediate;   Fair  Judgement:  Fair  Insight:  Fair  Psychomotor Activity:  Normal  Concentration:  Fair  Recall:  [employer] [place]  Language: Fair  Akathisia:  No  Handed:  Right  AIMS (if indicated):    Assets:  Communication Skills Desire for Improvement Housing Intimacy Physical Health Social Support  ADL's:  Intact  Cognition: WNL  Sleep:  7-8   Is the patient at risk to self?  No. Has the patient been a risk to self in the past 6 months?  No. Has the patient been a risk to self within the distant past?  No. Is the patient a risk to others?  No. Has the patient been a risk to others in the past 6 months?  No. Has the patient been a risk to others within the distant past?  No.  Allergies:   Allergies  Allergen Reactions  . Tequin [Gatifloxacin] Anaphylaxis and Rash  . Amoxicillin Hives   Current Medications: Current Outpatient Medications  Medication Sig Dispense Refill  . allopurinol (ZYLOPRIM) 100 MG tablet Take 1 tablet (100 mg total) by mouth daily. 30 tablet 6  . amLODipine (NORVASC) 10 MG tablet Take 5 mg by mouth daily.    [name] buPROPion (WELLBUTRIN SR) 150 MG 12 hr  tablet Take 1 tablet (150 mg total) daily by mouth. 90 tablet 1  . cyclobenzaprine (FLEXERIL) 10 MG tablet Take 1 tablet (10 mg total) by mouth at bedtime. 14 tablet 0  . EPIPEN 2-PAK 0.3 MG/0.3ML SOAJ injection     . fluticasone (FLONASE) 50 MCG/ACT nasal spray Place 2 sprays into both nostrils daily. 16 g 1  . levothyroxine (SYNTHROID, LEVOTHROID) 25 MCG tablet TAKE 1 TABLET(25 MCG) BY MOUTH DAILY BEFORE BREAKFAST 30 tablet 0  . lithium carbonate (ESKALITH) 450 MG CR tablet Take 1 tablet (450 mg total) every morning by mouth. 30 tablet 2  . loratadine (CLARITIN) 10 MG tablet Take 10 mg by mouth daily.    . Multiple Vitamin (MULTIVITAMIN WITH MINERALS) TABS Take 1 tablet by mouth daily.    [name] zolpidem (AMBIEN) 5 MG tablet Take 1 tablet (5 mg total) at bedtime as needed by mouth for sleep. 30 tablet 2   No current facility-administered medications for this visit.     Previous Psychotropic Medications: Lithium Wellbutrin Abilify prozac zoloft celexa Depakote seroquel- could not function risperdal pristiq Lamotrigine Latuda belsorma Trazodone   Substance Abuse History in the last 12 months:  Yes.    Consequences of Substance Abuse: Negative NA  Medical Decision Making:  Review of Psycho-Social Stressors (1) and Review and summation of old records (2)  Treatment Plan Summary: Medication management     Discussed with patient at length about his medications treatment risks benefits and alternatives. Continue medications as prescribed  Will continue on lithium carbonate 450 mg in the morning.  Continue  Wellbutrin XL 150 mg in the morning.  He will be started on Ambien 5 mg at bedtime to help with the insomnia and he agreed with the plan.  Follow-up in 2 months   More than 50% of the  time spent in psychoeducation, counseling and coordination of care.     This note was generated in part or whole with voice recognition software. Voice regonition is usually quite  accurate but there are transcription errors that can and very often do occur. I apologize for any typographical errors that were not detected and corrected.    Rainey Pines, MD    11/12/20181:13 PM

## 2017-08-19 ENCOUNTER — Other Ambulatory Visit: Payer: Self-pay | Admitting: Family Medicine

## 2017-08-19 DIAGNOSIS — J3089 Other allergic rhinitis: Secondary | ICD-10-CM | POA: Diagnosis not present

## 2017-08-19 DIAGNOSIS — J301 Allergic rhinitis due to pollen: Secondary | ICD-10-CM | POA: Diagnosis not present

## 2017-09-04 ENCOUNTER — Other Ambulatory Visit: Payer: Self-pay | Admitting: Family Medicine

## 2017-09-08 ENCOUNTER — Encounter (HOSPITAL_COMMUNITY): Payer: Self-pay | Admitting: *Deleted

## 2017-09-08 ENCOUNTER — Other Ambulatory Visit: Payer: Self-pay

## 2017-09-08 ENCOUNTER — Emergency Department (HOSPITAL_COMMUNITY)
Admission: EM | Admit: 2017-09-08 | Discharge: 2017-09-08 | Disposition: A | Discharge status: Home / Self Care | Payer: 59 | Attending: Emergency Medicine | Admitting: Emergency Medicine

## 2017-09-08 ENCOUNTER — Emergency Department (HOSPITAL_COMMUNITY): Payer: 59

## 2017-09-08 DIAGNOSIS — K429 Umbilical hernia without obstruction or gangrene: Secondary | ICD-10-CM | POA: Diagnosis not present

## 2017-09-08 DIAGNOSIS — K4020 Bilateral inguinal hernia, without obstruction or gangrene, not specified as recurrent: Secondary | ICD-10-CM | POA: Diagnosis not present

## 2017-09-08 DIAGNOSIS — K4090 Unilateral inguinal hernia, without obstruction or gangrene, not specified as recurrent: Secondary | ICD-10-CM | POA: Diagnosis not present

## 2017-09-08 DIAGNOSIS — Z79899 Other long term (current) drug therapy: Secondary | ICD-10-CM | POA: Insufficient documentation

## 2017-09-08 DIAGNOSIS — Z96653 Presence of artificial knee joint, bilateral: Secondary | ICD-10-CM | POA: Insufficient documentation

## 2017-09-08 DIAGNOSIS — K449 Diaphragmatic hernia without obstruction or gangrene: Secondary | ICD-10-CM

## 2017-09-08 DIAGNOSIS — E119 Type 2 diabetes mellitus without complications: Secondary | ICD-10-CM | POA: Diagnosis not present

## 2017-09-08 DIAGNOSIS — R1032 Left lower quadrant pain: Secondary | ICD-10-CM | POA: Diagnosis present

## 2017-09-08 DIAGNOSIS — E039 Hypothyroidism, unspecified: Secondary | ICD-10-CM | POA: Diagnosis not present

## 2017-09-08 DIAGNOSIS — I10 Essential (primary) hypertension: Secondary | ICD-10-CM | POA: Diagnosis not present

## 2017-09-08 LAB — CBC
HCT: 38.8 % — ABNORMAL LOW (ref 39.0–52.0)
Hemoglobin: 12.1 g/dL — ABNORMAL LOW (ref 13.0–17.0)
MCH: 22.2 pg — ABNORMAL LOW (ref 26.0–34.0)
MCHC: 31.2 g/dL (ref 30.0–36.0)
MCV: 71.3 fL — ABNORMAL LOW (ref 78.0–100.0)
Platelets: 245 10*3/uL (ref 150–400)
RBC: 5.44 MIL/uL (ref 4.22–5.81)
RDW: 19.1 % — ABNORMAL HIGH (ref 11.5–15.5)
WBC: 5.1 10*3/uL (ref 4.0–10.5)

## 2017-09-08 LAB — BASIC METABOLIC PANEL
Anion gap: 9 (ref 5–15)
BUN: 14 mg/dL (ref 6–20)
CO2: 23 mmol/L (ref 22–32)
Calcium: 9.1 mg/dL (ref 8.9–10.3)
Chloride: 105 mmol/L (ref 101–111)
Creatinine, Ser: 0.89 mg/dL (ref 0.61–1.24)
GFR calc Af Amer: >60 mL/min (ref >60)
GFR calc non Af Amer: >60 mL/min (ref >60)
Glucose, Bld: 205 mg/dL — ABNORMAL HIGH (ref 65–99)
Potassium: 3.9 mmol/L (ref 3.5–5.1)
Sodium: 137 mmol/L (ref 135–145)

## 2017-09-08 MED ORDER — FENTANYL CITRATE (PF) 100 MCG/2ML IJ SOLN
INTRAMUSCULAR | Status: AC
  Administered 2017-09-08: 100 ug
  Filled 2017-09-08: qty 2

## 2017-09-08 MED ORDER — IOPAMIDOL (ISOVUE-300) INJECTION 61%
INTRAVENOUS | Status: AC
  Filled 2017-09-08: qty 30

## 2017-09-08 MED ORDER — SODIUM CHLORIDE 0.9 % IV BOLUS (SEPSIS)
1000.0000 mL | Freq: Once | INTRAVENOUS | Status: AC
  Administered 2017-09-08: 1000 mL via INTRAVENOUS

## 2017-09-08 MED ORDER — ONDANSETRON HCL 4 MG/2ML IJ SOLN
4.0000 mg | Freq: Once | INTRAMUSCULAR | Status: AC
  Administered 2017-09-08: 4 mg via INTRAVENOUS
  Filled 2017-09-08: qty 2

## 2017-09-08 MED ORDER — MORPHINE SULFATE (PF) 4 MG/ML IV SOLN
4.0000 mg | Freq: Once | INTRAVENOUS | Status: AC
  Administered 2017-09-08: 4 mg via INTRAVENOUS
  Filled 2017-09-08: qty 1

## 2017-09-08 MED ORDER — FENTANYL CITRATE (PF) 100 MCG/2ML IJ SOLN
50.0000 ug | Freq: Once | INTRAMUSCULAR | Status: AC
  Administered 2017-09-08: 100 ug via INTRAVENOUS
  Filled 2017-09-08: qty 2

## 2017-09-08 MED ORDER — OXYCODONE-ACETAMINOPHEN 5-325 MG PO TABS
1.0000 | ORAL_TABLET | ORAL | Status: DC | PRN
Start: 2017-09-08 — End: 2017-09-09
  Administered 2017-09-08: 1 via ORAL
  Filled 2017-09-08: qty 1

## 2017-09-08 NOTE — ED Triage Notes (Signed)
Pt states slipped on grease and landing on recliner, didn't land on floor.  However, his L inguinal hernia is hurting more since then and he cannot reduce it.

## 2017-09-08 NOTE — ED Provider Notes (Signed)
Hx of hernia, primary [facility],  Jeremiah [name] surgery: get a CT scan with oral contrast, call him back after CT is back NPO since AM, Left inguinal hernia.    Clinical Course as of Sep 08 2233  Mon Sep 08, 2017  2113 Introduced my self to patient with Geanie Kenning PA-C and explained I would be taking over his care.  He is resting comfortably.  No needs at this time.   [EH]  2141 Paged Dr. Donne [name] on call for surgery with CT results.   [EH]  2141 Dr. Donne [name] says pt saw Dr. Kaylyn Lim previously/  Needs it fixed, not now, recommends outpatient follow up.   [EH]  2200 Spoke with patient, he currently has no pain.  He was informed of plan, from surgery.  He was instructed to start miralax 1-2x daily to help with constipation.   [EH]    Clinical Course User Index [EH] Lorin Glass, PA-C    Results for orders placed or performed during the hospital encounter of 09/08/17  CBC  Result Value Ref Range   WBC 5.1 4.0 - 10.5 K/uL   RBC 5.44 4.22 - 5.81 MIL/uL   Hemoglobin 12.1 (L) 13.0 - 17.0 g/dL   HCT 38.8 (L) 39.0 - 52.0 %   MCV 71.3 (L) 78.0 - 100.0 fL   MCH 22.2 (L) 26.0 - 34.0 pg   MCHC 31.2 30.0 - 36.0 g/dL   RDW 19.1 (H) 11.5 - 15.5 %   Platelets 245 150 - 400 K/uL  Basic metabolic panel  Result Value Ref Range   Sodium 137 135 - 145 mmol/L   Potassium 3.9 3.5 - 5.1 mmol/L   Chloride 105 101 - 111 mmol/L   CO2 23 22 - 32 mmol/L   Glucose, Bld 205 (H) 65 - 99 mg/dL   BUN 14 6 - 20 mg/dL   Creatinine, Ser 0.89 0.61 - 1.24 mg/dL   Calcium 9.1 8.9 - 10.3 mg/dL   GFR calc non Af Amer >60 >60 mL/min   GFR calc Af Amer >60 >60 mL/min   Anion gap 9 5 - 15   Ct Abdomen Pelvis Wo Contrast  Result Date: 09/08/2017 CLINICAL DATA:  Recurrent inguinal hernia. EXAM: CT ABDOMEN AND PELVIS WITHOUT CONTRAST TECHNIQUE: Multidetector CT imaging of the abdomen and pelvis was performed following the standard protocol without IV contrast. COMPARISON:  06/20/2010 FINDINGS: Lower chest:  Bibasilar atelectasis seen and/or scarring at each lung base. Top-normal size heart. Moderate-sized hiatal hernia. Hepatobiliary: No focal liver abnormality is seen. No gallstones, gallbladder wall thickening, or biliary dilatation. Pancreas: Stable invagination of retroperitoneal fat along the dorsal aspect of the pancreatic body, similar to prior. No ductal dilatation or definite space-occupying mass given limitations of a noncontrast study. Spleen: Normal in size without focal abnormality. Adrenals/Urinary Tract: Adrenal glands are unremarkable. Kidneys are normal, without renal calculi, focal lesion, or hydronephrosis. Bladder is unremarkable. Stomach/Bowel: Recurrent left inguinal hernia containing omental fat and a short segment of nonincarcerated appearing distal descending/sigmoid colon. Hernia sac measures approximately 5.3 x 8.2 x 5.1 cm. No bowel obstruction. Moderate fecal retention within the colon. Status post appendectomy. Vascular/Lymphatic: No significant vascular findings are present. No enlarged abdominal or pelvic lymph nodes. Reproductive: Prostate is unremarkable. Other: Omental fat containing right inguinal hernia. Tiny supraumbilical fat containing hernia appear Mild mesenteric edema in the left upper quadrant with small lymph nodes may represent a sclerosing mesenteritis. Musculoskeletal: Marked disc space narrowing L1-2 with small posterior marginal osteophytes. No  acute nor suspicious osseous abnormalities IMPRESSION: 1. Bilateral inguinal hernias, recurrent on the left containing omental fat and a short segment of nonincarcerated distal descending/sigmoid colon. Omental fat is seen within the right inguinal hernia. 2. Chronic stable moderate-sized hiatal hernia. 3. Slightly hazy appearing mesenteric fat in the left upper quadrant, nonspecific but can be seen with sclerosing mesenteritis. 4. Marked degenerative disc disease L1-2. Electronically Signed   By: Ashley Royalty M.D.   On:  09/08/2017 21:16    Plan: Patient's CT scan was obtained.  I reviewed the results with Dr. Donne [name] who reports that patient is appropriate for outpatient follow-up.  Says that he does not see any evidence the patient requires admission at this time.  Patient remained afebrile, hemodynamically stable while in the department.  He reports that he was having occasional pain twinges, however this has resolved.  He was advised to follow up with Dr. Gillis Ends who he has seen previously for this.  He had no complaints at the time of discharge.  Was instructed to start Miralax due to CT with moderate colonic fecal load.  He states his understanding of dc instructions and return precautions.    Lorin Glass, PA-C 09/08/17 2242    Daleen Bo, MD 09/09/17 [phone]

## 2017-09-08 NOTE — ED Notes (Signed)
Patient verbalizes understanding of discharge instructions. Opportunity for questioning and answers were provided. 

## 2017-09-08 NOTE — ED Provider Notes (Signed)
[place] EMERGENCY DEPARTMENT Provider Note   CSN: [id number] Arrival date & time: 09/08/17  7408     History   Chief Complaint Chief Complaint  Patient presents with  . Inguinal Hernia    HPI Jeremiah [name] is a 56 y.o. male.  HPI   Jeremiah [name] is a 56 year old male with a history of left inguinal hernia repair (May, 2018), hypertension, bipolar disorder who presents to the emergency department for evaluation of left groin pain.  Patient states that his hernia repair surgery was performed at the [facility] in [place], and he had recurrence of a "fatty portion" of the hernia following the surgery.  He was told by surgeons that this was not something that required another operation.  He states that he is typically able to reduce the hernia himself when it recurs.  Last night he slipped and his legs went into the splints position. Since that time he has had left groin swelling and pain, and has been unable to reduce the hernia himself.  He reports 7/10 severity burning left groin pain which is worsened with coughing or standing.  Lying flat seems to improve his pain.  He has not taken any over-the-counter medications for his pain.  Denies fever, nausea/vomiting, diarrhea, dysuria, urinary difficulty, testicular pain or swelling.  States that he had a bowel movement this morning which was normal.   Past Medical History:  Diagnosis Date  . Anxiety   . Arthritis    bil knees  . Bipolar disorder ([place])   . Colon polyp Dec 2015   Dr Allen Norris  . Depression    dx 2004  . Diabetes mellitus without complication ([place])    type ll  controlled by weight & diet  . Gout   . H/O hiatal hernia    had surgery for that 2012  [facility]  . Heartburn   . Hypertension   . Hypothyroidism   . Sleep apnea    dx 1.5 yr ago...study @ [facility]  . Sleep apnea    uses c pap    Patient Active Problem List   Diagnosis Date Noted  . Allergic rhinitis due to pollen 06/23/2017  . Chest pain  03/05/2016  . Pedal edema 03/08/2015  . History of repair of rotator cuff 02/17/2014  . Diabetes ([place]) 08/31/2013  . BP (high blood pressure) 08/31/2013  . Adult hypothyroidism 08/31/2013  . Obstructive apnea 08/31/2013  . Clinical depression 08/31/2013  . Diabetes mellitus ([place]) 08/31/2013  . Idiopathic localized osteoarthropathy 05/31/2013  . ABDOMINAL PAIN, LEFT UPPER QUADRANT 10/04/2009  . TARSAL TUNNEL SYNDROME, RIGHT 09/25/2009  . ANKLE PAIN, RIGHT 09/25/2009  . OTHER ANKLE SPRAIN AND STRAIN 09/25/2009  . BREAST MASS, RIGHT 01/07/2008  . INSOMNIA 01/07/2008  . GASTRIC ULCER, ACUTE 11/09/2007  . DUODENITIS 11/09/2007  . BLOOD IN STOOL, OCCULT 11/09/2007  . PERSONAL HISTORY OF COLONIC POLYPS 11/09/2007  . BACK PAIN, LUMBAR 10/12/2007  . HELICOBACTER PYLORI GASTRITIS 09/09/2007  . GASTRIC ULCER, ACUTE, HEMORRHAGE 09/09/2007  . HIATAL HERNIA 09/09/2007  . COLONIC POLYPS, ADENOMATOUS 08/19/2007  . INTERNAL HEMORRHOIDS 08/19/2007  . EXTERNAL HEMORRHOIDS WITHOUT MENTION COMP 08/07/2007  . Blood in stool 08/07/2007  . ABDOMINAL PAIN, LEFT LOWER QUADRANT 08/07/2007  . SINUSITIS, ACUTE 06/12/2007  . ABDOMINAL PAIN 06/12/2007  . DIABETES MELLITUS, TYPE II 03/19/2007  . GOUT 03/19/2007  . DISORDER, CIRCADIAN RHYTHM SLEEP, NONORGANIC 03/19/2007  . Depression 03/19/2007  . OSTEOARTHROSIS, GENERALIZED, MULTIPLE SITES 03/19/2007    Past  Surgical History:  Procedure Laterality Date  . APPENDECTOMY    . COLONOSCOPY  Dec 2015   Dr Allen Norris  . EXCISIONAL HEMORRHOIDECTOMY  01/02/15  . FOOT SURGERY     right foot 2012  . HERNIA REPAIR     Left inguinal  . HERNIA REPAIR     hiatal hernia at [facility]  . MANDIBLE SURGERY     1983  . REPLACEMENT TOTAL KNEE BILATERAL     patient had knee replacement to his right side in 2015 and right 2014, Revision of R knee 12/04/16  . ROTATOR CUFF REPAIR     x 3  on right shoulder  . SHOULDER ARTHROSCOPY WITH ROTATOR CUFF REPAIR AND SUBACROMIAL  DECOMPRESSION Left 03/04/2013   Procedure: LEFT SHOULDER ARTHROSCOPY WITH ROTATOR CUFF REPAIR AND SUBACROMIAL DECOMPRESSION;  Surgeon: Marin Shutter, MD;  Location: [place];  Service: Orthopedics;  Laterality: Left;  . SHOULDER SURGERY     x 3 on left       Home Medications    Prior to Admission medications   Medication Sig Start Date End Date Taking? Authorizing Provider  allopurinol (ZYLOPRIM) 100 MG tablet TAKE 1 TABLET(100 MG) BY MOUTH DAILY 08/19/17  Yes Jerrol Banana., MD  amLODipine (NORVASC) 10 MG tablet Take 5 mg by mouth daily.   Yes [provider]  buPROPion (WELLBUTRIN SR) 150 MG 12 hr tablet Take 1 tablet (150 mg total) daily by mouth. 08/18/17  Yes Rainey Pines, MD  cyclobenzaprine (FLEXERIL) 10 MG tablet Take 1 tablet (10 mg total) by mouth at bedtime. 07/31/17  Yes Carles Collet M, PA-C  fluticasone (FLONASE) 50 MCG/ACT nasal spray Place 2 sprays into both nostrils daily. 06/23/17  Yes Chauvin, Herbie [place], PA  levothyroxine (SYNTHROID, LEVOTHROID) 25 MCG tablet TAKE 1 TABLET(25 MCG) BY MOUTH DAILY BEFORE BREAKFAST 09/04/17  Yes Jerrol Banana., MD  lithium carbonate (ESKALITH) 450 MG CR tablet Take 1 tablet (450 mg total) every morning by mouth. 08/18/17  Yes Rainey Pines, MD  loratadine (CLARITIN) 10 MG tablet Take 10 mg by mouth daily.   Yes [provider]  Multiple Vitamin (MULTIVITAMIN WITH MINERALS) TABS Take 1 tablet by mouth daily.   Yes [provider]  testosterone cypionate (DEPOTESTOTERONE CYPIONATE) 100 MG/ML injection Inject into the muscle every 14 (fourteen) days. Pt gets at doctor office; dose unknown.   Yes [provider]  zolpidem (AMBIEN) 5 MG tablet Take 1 tablet (5 mg total) at bedtime as needed by mouth for sleep. 08/18/17  Yes Rainey Pines, MD  EPIPEN 2-PAK 0.3 MG/0.3ML SOAJ injection  11/07/15   [provider]    Family History Family History  Problem Relation Age of Onset  . CAD Mother     . Arthritis Mother   . Angina Mother   . Diabetes Father   . Heart disease Father   . Colon polyps Father   . Heart attack Father   . Bipolar disorder Sister   . Asthma Sister   . Schizophrenia Sister   . Diabetes Maternal Grandmother   . Cancer Maternal Grandmother   . Heart attack Maternal Grandfather   . Stomach cancer Paternal Grandmother   . Kidney disease Neg Hx   . Prostate cancer Neg Hx     Social History Social History   Tobacco Use  . Smoking status: Never Smoker  . Smokeless tobacco: Former Systems developer    Types: Chew  Substance Use Topics  . Alcohol use: Yes  Alcohol/week: 3.6 - 4.8 oz    Types: 6 - 8 Cans of beer per week    Comment: 2 beers per day  . Drug use: No     Allergies   Tequin [gatifloxacin] and Amoxicillin   Review of Systems Review of Systems  Constitutional: Negative for chills, fatigue and fever.  Eyes: Negative for visual disturbance.  Respiratory: Negative for shortness of breath.   Cardiovascular: Negative for chest pain.  Gastrointestinal: Positive for abdominal pain (left groin). Negative for constipation, diarrhea, nausea and vomiting.  Genitourinary: Negative for difficulty urinating, dysuria, frequency, hematuria, scrotal swelling and testicular pain.  Musculoskeletal: Negative for arthralgias and gait problem.  Skin: Negative for rash.  Neurological: Negative for weakness, numbness and headaches.  Psychiatric/Behavioral: Negative for agitation.     Physical Exam Updated Vital Signs BP (!) 133/107 (BP Location: Right Arm)   Pulse 84   Temp 98.6 F (37 C) (Oral)   Resp 20   Ht 5\' 10"  (1.778 m)   Wt 93 kg (205 lb)   SpO2 94%   BMI 29.41 kg/m   Physical Exam  Constitutional: He is oriented to person, place, and time. He appears well-developed and well-nourished. No distress.  HENT:  Head: Normocephalic and atraumatic.  Mouth/Throat: Oropharynx is clear and moist. No oropharyngeal exudate.  Eyes: Conjunctivae are  normal. Pupils are equal, round, and reactive to light. Right eye exhibits no discharge. Left eye exhibits no discharge.  Neck: Normal range of motion. Neck supple.  Cardiovascular: Normal rate, regular rhythm and intact distal pulses. Exam reveals no friction rub.  No murmur heard. Pulmonary/Chest: Effort normal and breath sounds normal. No stridor. No respiratory distress. He has no wheezes.  Abdominal: Soft. Bowel sounds are normal. There is no guarding.  Left groin swelling approximately 3cm in diameter, tender to the touch. No overlying erythema, induration, warmth.  No tenderness in the suprapubic area.  No rebound, rigidity or guarding.  Lymphadenopathy:    He has no cervical adenopathy.  Neurological: He is alert and oriented to person, place, and time. Coordination normal.  Skin: Skin is warm and dry. Capillary refill takes less than 2 seconds. He is not diaphoretic.  Psychiatric: He has a normal mood and affect. His behavior is normal.  Nursing note and vitals reviewed.    ED Treatments / Results  Labs (all labs ordered are listed, but only abnormal results are displayed) Labs Reviewed  CBC  BASIC METABOLIC PANEL    EKG  EKG Interpretation None       Radiology No results found.  Procedures Procedures (including critical care time)  Medications Ordered in ED Medications  oxyCODONE-acetaminophen (PERCOCET/ROXICET) 5-325 MG per tablet 1 tablet (1 tablet Oral Given 09/08/17 0953)  sodium chloride 0.9 % bolus 1,000 mL (not administered)     Initial Impression / Assessment and Plan / ED Course  I have reviewed the triage vital signs and the nursing notes.  Pertinent labs & imaging results that were available during my care of the patient were reviewed by me and considered in my medical decision making (see chart for details).  Clinical Course as of Sep 11 828  Mon Sep 08, 2017  2113 Introduced my self to patient with Geanie Kenning PA-C and explained I would be  taking over his care.  He is resting comfortably.  No needs at this time.   [EH]  2141 Paged Dr. Donne Hazel on call for surgery with CT results.   [EH]  2141 Dr. Donne Hazel  says pt saw Dr. Kaylyn Lim previously/  Needs it fixed, not now, recommends outpatient follow up.   [EH]  2200 Spoke with patient, he currently has no pain.  He was informed of plan, from surgery.  He was instructed to start miralax 1-2x daily to help with constipation.   [EH]    Clinical Course User Index [EH] Lorin Glass, PA-C   Patient presents with left inguinal hernia tenderness and bulging.  Vital signs are stable, no acute distress. No nausea, vomiting to suggest bowel obstruction. Attempted to reduce the hernia under analgesia with Dr. Eulis Foster present, but was unsuccessful. Labs reviewed, BMP and CBC unremarkable.   Spoke with General Surgery Dr. Donne Hazel who requests CT for further evaluation.  CT scan ordered, patient's pain under control with analgesia given in the ER. Sign out given to PA Wyn Quaker who will contact Dr. Donne Hazel after CT result returns for disposition. He has been NPO since he came to the ER.   Final Clinical Impressions(s) / ED Diagnoses   Final diagnoses:  None    ED Discharge Orders    None       Glyn Ade, PA-C 09/08/17 2109    Glyn Ade, PA-C 09/11/17 0830    Daleen Bo, MD 09/11/17 [phone]

## 2017-09-08 NOTE — ED Notes (Signed)
Pt ambulatory to bathroom with steady gait.

## 2017-09-08 NOTE — ED Notes (Signed)
PA and MD reducing inguinal hernia at this time

## 2017-09-08 NOTE — Discharge Instructions (Signed)
Today your CT scan showed a hernia in both your left and your right groin, a hernia in your bellybutton, and what is called a hiatal hernia.  A hiatal hernia is when part of your intestines or stomach can pass through the muscle separating the abdomen from the chest.  This appears to be the same as in previous CT scans.   Please start taking miralax.  It can take multiple days to work and is important for you to drink extra water to help with your constipation.    Today your blood work was reassuring.  If you develop fevers, nausea, vomiting, redness around the hernia with increased pain or have any concerns you may always return to the ED for repeat evaluation.

## 2017-09-08 NOTE — ED Provider Notes (Signed)
  Face-to-face evaluation   History: He complains of pain and swelling in the left groin, and inability to reduce his hernia since injuring the left groin, yesterday.  Prior surgery left groin for hernia with mesh placement.  Prior to yesterday he did have mass that was intermittent, but able to be reduced.  He is not vomiting currently.  Physical exam: Alert, calm, cooperative.  Abdomen soft and nontender.  Mass left groin approximately 3-4 cm, rounded, tender to palpation.  No overlying skin abnormality.  Unable to reduce mass, with gentle palpation/pressure, and manipulation.  Second attempt to reduce, using pretreatment with fentanyl, 2 doses, 100 mcg.  During reduction attempt, bowel gas is palpated, within the mass. Similar maneuver done without reduction accomplished.    Assessment-nonreducible hernia, likely containing bowel, left groin.  Suspect incarceration, without ischemia at this time.  Patient is at risk for intestinal compromise, if the mass is not reduced, therefore he will require surgical consultation, and likely urgent open reduction.  Medical screening examination/treatment/procedure(s) were conducted as a shared visit with non-physician practitioner(s) and myself.  I personally evaluated the patient during the encounter    Daleen Bo, MD 09/09/17 [phone]

## 2017-09-08 NOTE — ED Notes (Signed)
PA @ bedside.

## 2017-09-11 ENCOUNTER — Ambulatory Visit (INDEPENDENT_AMBULATORY_CARE_PROVIDER_SITE_OTHER): Payer: 59

## 2017-09-11 DIAGNOSIS — E291 Testicular hypofunction: Secondary | ICD-10-CM | POA: Diagnosis not present

## 2017-09-11 MED ORDER — TESTOSTERONE UNDECANOATE 750 MG/3ML IM SOLN
Freq: Once | INTRAMUSCULAR | Status: AC
  Administered 2017-09-11: 17:00:00 via INTRAMUSCULAR

## 2017-09-11 NOTE — Progress Notes (Signed)
Aveed Injection  Due to Hypogonadism patient is present today for a Aveed Injection.  Medication: Aveed Dose: 750mg / 45ml [id number] Exp:05/2021 Location: left upper outer buttocks Injection of medication was given over a 60 second period of time.  Patient tolerated well, no complications were noted Patient remained in the office for 30 minutes post injection for monitoring and will report any side effects.  Preformed by: Toniann Fail, LPN   Additional notes/ Follow up: 10 weeks

## 2017-09-12 DIAGNOSIS — J3089 Other allergic rhinitis: Secondary | ICD-10-CM | POA: Diagnosis not present

## 2017-09-12 DIAGNOSIS — J301 Allergic rhinitis due to pollen: Secondary | ICD-10-CM | POA: Diagnosis not present

## 2017-09-15 ENCOUNTER — Encounter: Payer: Self-pay | Admitting: Family Medicine

## 2017-09-16 DIAGNOSIS — J301 Allergic rhinitis due to pollen: Secondary | ICD-10-CM | POA: Diagnosis not present

## 2017-09-16 DIAGNOSIS — J3089 Other allergic rhinitis: Secondary | ICD-10-CM | POA: Diagnosis not present

## 2017-09-18 DIAGNOSIS — M5136 Other intervertebral disc degeneration, lumbar region: Secondary | ICD-10-CM | POA: Diagnosis not present

## 2017-09-23 ENCOUNTER — Ambulatory Visit (INDEPENDENT_AMBULATORY_CARE_PROVIDER_SITE_OTHER): Payer: 59 | Admitting: Family Medicine

## 2017-09-23 ENCOUNTER — Other Ambulatory Visit: Payer: Self-pay

## 2017-09-23 ENCOUNTER — Encounter: Payer: Self-pay | Admitting: Family Medicine

## 2017-09-23 VITALS — BP 138/82 | HR 92 | Temp 97.8°F | Resp 16 | Ht 70.0 in | Wt 207.0 lb

## 2017-09-23 DIAGNOSIS — Z1159 Encounter for screening for other viral diseases: Secondary | ICD-10-CM

## 2017-09-23 DIAGNOSIS — Z125 Encounter for screening for malignant neoplasm of prostate: Secondary | ICD-10-CM | POA: Diagnosis not present

## 2017-09-23 DIAGNOSIS — Z0000 Encounter for general adult medical examination without abnormal findings: Secondary | ICD-10-CM | POA: Diagnosis not present

## 2017-09-23 DIAGNOSIS — K219 Gastro-esophageal reflux disease without esophagitis: Secondary | ICD-10-CM

## 2017-09-23 DIAGNOSIS — Z1211 Encounter for screening for malignant neoplasm of colon: Secondary | ICD-10-CM

## 2017-09-23 DIAGNOSIS — Z5181 Encounter for therapeutic drug level monitoring: Secondary | ICD-10-CM | POA: Diagnosis not present

## 2017-09-23 DIAGNOSIS — Z79899 Other long term (current) drug therapy: Secondary | ICD-10-CM | POA: Diagnosis not present

## 2017-09-23 DIAGNOSIS — M170 Bilateral primary osteoarthritis of knee: Secondary | ICD-10-CM

## 2017-09-23 NOTE — Progress Notes (Signed)
Patient: Jeremiah [name], Male    DOB: 1961-02-11, 56 y.o.   MRN: [id number] Visit Date: 09/23/2017  Today's Provider: [name], MD   Chief Complaint  Patient presents with  . Annual Exam   Subjective:  Jeremiah [name] is a 56 y.o. male who presents today for health maintenance and complete physical. He feels fairly well. He reports exercising none. He reports he is sleeping fairly well.  Immunization History  Administered Date(s) Administered  . Influenza, Seasonal, Injecte, Preservative Fre 07/01/2017  . Td 02/22/2003  . Tdap 07/02/2011   09/19/14 Colonoscopy, [place]ohl-Tubular adenoma  Review of Systems  Constitutional: Positive for fatigue.  HENT: Positive for ear discharge, rhinorrhea, sneezing and tinnitus.   Eyes: Positive for visual disturbance.  Respiratory: Negative.   Cardiovascular: Negative.   Gastrointestinal: Positive for abdominal pain.  Endocrine: Negative.   Genitourinary: Positive for testicular pain.  Musculoskeletal: Positive for back pain and neck stiffness.  Skin: Negative.   Allergic/Immunologic: Negative.   Neurological: Negative.   Hematological: Negative.   Psychiatric/Behavioral: Positive for decreased concentration and sleep disturbance.    Social History   Socioeconomic History  . Marital status: Married    Spouse name: Not on file  . Number of children: Not on file  . Years of education: Not on file  . Highest education level: Not on file  Social Needs  . Financial resource strain: Not on file  . Food insecurity - worry: Not on file  . Food insecurity - inability: Not on file  . Transportation needs - medical: Not on file  . Transportation needs - non-medical: Not on file  Occupational History  . Not on file  Tobacco Use  . Smoking status: Never Smoker  . Smokeless tobacco: Former Systems developer    Types: Chew  Substance and Sexual Activity  . Alcohol use: Yes    Alcohol/week: 3.6 - 4.8 oz    Types: 6 - 8 Cans of beer per week     Comment: 2 beers per day  . Drug use: No  . Sexual activity: Yes  Other Topics Concern  . Not on file  Social History Narrative  . Not on file    Patient Active Problem List   Diagnosis Date Noted  . Left inguinal hernia 09/08/2017  . Right inguinal hernia 09/08/2017  . Umbilical hernia without obstruction and without gangrene 09/08/2017  . Allergic rhinitis due to pollen 06/23/2017  . Chest pain 03/05/2016  . Pedal edema 03/08/2015  . History of repair of rotator cuff 02/17/2014  . Diabetes ([place]) 08/31/2013  . BP (high blood pressure) 08/31/2013  . Adult hypothyroidism 08/31/2013  . Obstructive apnea 08/31/2013  . Clinical depression 08/31/2013  . Diabetes mellitus ([place]) 08/31/2013  . Idiopathic localized osteoarthropathy 05/31/2013  . ABDOMINAL PAIN, LEFT UPPER QUADRANT 10/04/2009  . TARSAL TUNNEL SYNDROME, RIGHT 09/25/2009  . ANKLE PAIN, RIGHT 09/25/2009  . OTHER ANKLE SPRAIN AND STRAIN 09/25/2009  . BREAST MASS, RIGHT 01/07/2008  . INSOMNIA 01/07/2008  . GASTRIC ULCER, ACUTE 11/09/2007  . DUODENITIS 11/09/2007  . BLOOD IN STOOL, OCCULT 11/09/2007  . PERSONAL HISTORY OF COLONIC POLYPS 11/09/2007  . BACK PAIN, LUMBAR 10/12/2007  . HELICOBACTER PYLORI GASTRITIS 09/09/2007  . GASTRIC ULCER, ACUTE, HEMORRHAGE 09/09/2007  . Hiatal hernia 09/09/2007  . COLONIC POLYPS, ADENOMATOUS 08/19/2007  . INTERNAL HEMORRHOIDS 08/19/2007  . EXTERNAL HEMORRHOIDS WITHOUT MENTION COMP 08/07/2007  . Blood in stool 08/07/2007  . ABDOMINAL PAIN, LEFT LOWER QUADRANT 08/07/2007  . SINUSITIS, ACUTE  06/12/2007  . ABDOMINAL PAIN 06/12/2007  . DIABETES MELLITUS, TYPE II 03/19/2007  . GOUT 03/19/2007  . DISORDER, CIRCADIAN RHYTHM SLEEP, NONORGANIC 03/19/2007  . Depression 03/19/2007  . OSTEOARTHROSIS, GENERALIZED, MULTIPLE SITES 03/19/2007    Past Surgical History:  Procedure Laterality Date  . APPENDECTOMY    . COLONOSCOPY  Dec 2015   Dr Allen Norris  . EXCISIONAL HEMORRHOIDECTOMY  01/02/15  .  FOOT SURGERY     right foot 2012  . HERNIA REPAIR     Left inguinal  . HERNIA REPAIR     hiatal hernia at [place]  . MANDIBLE SURGERY     1983  . REPLACEMENT TOTAL KNEE BILATERAL     patient had knee replacement to his right side in 2015 and right 2014, Revision of R knee 12/04/16  . ROTATOR CUFF REPAIR     x 3  on right shoulder  . SHOULDER ARTHROSCOPY WITH ROTATOR CUFF REPAIR AND SUBACROMIAL DECOMPRESSION Left 03/04/2013   Procedure: LEFT SHOULDER ARTHROSCOPY WITH ROTATOR CUFF REPAIR AND SUBACROMIAL DECOMPRESSION;  Surgeon: Marin Shutter, MD;  Location: [facility];  Service: Orthopedics;  Laterality: Left;  . SHOULDER SURGERY     x 3 on left    His family history includes Angina in his mother; Arthritis in his mother; Asthma in his sister; Bipolar disorder in his sister; CAD in his mother; Cancer in his maternal grandmother; Colon polyps in his father; Diabetes in his father and maternal grandmother; Heart attack in his father and maternal grandfather; Heart disease in his father; Schizophrenia in his sister; Stomach cancer in his paternal grandmother.     Outpatient Encounter Medications as of 09/23/2017  Medication Sig Note  . allopurinol (ZYLOPRIM) 100 MG tablet TAKE 1 TABLET(100 MG) BY MOUTH DAILY   . amLODipine (NORVASC) 10 MG tablet Take 5 mg by mouth daily.   [name] buPROPion (WELLBUTRIN SR) 150 MG 12 hr tablet Take 1 tablet (150 mg total) daily by mouth.   . cyclobenzaprine (FLEXERIL) 10 MG tablet Take 1 tablet (10 mg total) by mouth at bedtime.   [name] EPIPEN 2-PAK 0.3 MG/0.3ML SOAJ injection  11/17/2015: Received from: External Pharmacy  . fluticasone (FLONASE) 50 MCG/ACT nasal spray Place 2 sprays into both nostrils daily.   [name] HYDROcodone-acetaminophen (NORCO) 10-325 MG tablet Take 1 tablet by mouth every 6 (six) hours as needed.   [name] levothyroxine (SYNTHROID, LEVOTHROID) 25 MCG tablet TAKE 1 TABLET(25 MCG) BY MOUTH DAILY BEFORE BREAKFAST   . lithium carbonate (ESKALITH) 450 MG CR tablet Take 1  tablet (450 mg total) every morning by mouth.   . loratadine (CLARITIN) 10 MG tablet Take 10 mg by mouth daily.   . Multiple Vitamin (MULTIVITAMIN WITH MINERALS) TABS Take 1 tablet by mouth daily.   [name] omeprazole (PRILOSEC) 20 MG capsule Take 20 mg by mouth daily.   [name] testosterone cypionate (DEPOTESTOTERONE CYPIONATE) 100 MG/ML injection Inject into the muscle every 14 (fourteen) days. Pt gets at doctor office; dose unknown.   . zolpidem (AMBIEN) 5 MG tablet Take 1 tablet (5 mg total) at bedtime as needed by mouth for sleep.    No facility-administered encounter medications on file as of 09/23/2017.    Functional Status Survey: Is the patient deaf or have difficulty hearing?: No Does the patient have difficulty seeing, even when wearing glasses/contacts?: Yes Does the patient have difficulty concentrating, remembering, or making decisions?: Yes Does the patient have difficulty walking or climbing stairs?: Yes Does the patient have difficulty dressing or bathing?:  No Does the patient have difficulty doing errands alone such as visiting a doctor's office or shopping?: No  Fall Risk  09/23/2017  Falls in the past year? No   Depression screen [facility] 2/9 09/23/2017 12/31/2016  Decreased Interest 2 1  Down, Depressed, Hopeless 1 1  PHQ - 2 Score 3 2  Altered sleeping 3 0  Tired, decreased energy 3 1  Change in appetite 0 0  Feeling bad or failure about yourself  1 1  Trouble concentrating 3 0  Moving slowly or fidgety/restless 0 0  Suicidal thoughts 1 1  PHQ-9 Score 14 5  Difficult doing work/chores Somewhat difficult -   Patient Care Team: Jerrol Banana., MD as PCP - General (Unknown Physician Specialty) Jerrol Banana., MD (Family Medicine) Christene Lye, MD (General Surgery)      Objective:   Vitals:  Vitals:   09/23/17 1403  BP: 138/82  Pulse: 92  Resp: 16  Temp: 97.8 F (36.6 C)  TempSrc: Oral  Weight: 207 lb (93.9 kg)  Height: 5\' 10"  (1.778 m)     Physical Exam  Constitutional: He is oriented to person, place, and time. He appears well-developed and well-nourished.  HENT:  Head: Normocephalic and atraumatic.  Right Ear: External ear normal.  Left Ear: External ear normal.  Nose: Nose normal.  Mouth/Throat: Oropharynx is clear and moist.  Eyes: Conjunctivae and EOM are normal. Pupils are equal, round, and reactive to light.  Neck: Normal range of motion. Neck supple.  Cardiovascular: Normal rate, regular rhythm, normal heart sounds and intact distal pulses.  Pulmonary/Chest: Effort normal and breath sounds normal.  Abdominal: Soft. Bowel sounds are normal.  Genitourinary: Penis normal.  Musculoskeletal: Normal range of motion.  Neurological: He is alert and oriented to person, place, and time.  Skin: Skin is warm and dry.  Psychiatric: He has a normal mood and affect. His behavior is normal. Judgment and thought content normal.      Assessment & Plan:     Routine Health Maintenance and Physical Exam  Exercise Activities and Dietary recommendations Goals    None      Immunization History  Administered Date(s) Administered  . Influenza, Seasonal, Injecte, Preservative Fre 07/01/2017  . Td 02/22/2003  . Tdap 07/02/2011    Health Maintenance  Topic Date Due  . Hepatitis C Screening  [birth year]  . PNEUMOCOCCAL POLYSACCHARIDE VACCINE (1) 11/29/1960  . FOOT EXAM  11/29/1968  . OPHTHALMOLOGY EXAM  11/29/1968  . URINE MICROALBUMIN  11/29/1968  . HIV Screening  11/29/1973  . HEMOGLOBIN A1C  03/12/2017  . COLONOSCOPY  09/20/2017  . TETANUS/TDAP  07/01/2021  . INFLUENZA VACCINE  Completed     Discussed health benefits of physical activity, and encouraged him to engage in regular exercise appropriate for his age and condition.  S/p 8 shoulder surgeries and 3 knee surgeries Bipolar Depression Tubular Adenoma    I have done the exam and reviewed the chart and it is accurate to the best of my knowledge. Risk manager has been used and  any errors in dictation or transcription are unintentional. Miguel Aschoff M.D. [facility]

## 2017-09-24 ENCOUNTER — Telehealth: Payer: Self-pay | Admitting: Emergency Medicine

## 2017-09-24 ENCOUNTER — Other Ambulatory Visit (INDEPENDENT_AMBULATORY_CARE_PROVIDER_SITE_OTHER): Payer: 59 | Admitting: Emergency Medicine

## 2017-09-24 DIAGNOSIS — K4091 Unilateral inguinal hernia, without obstruction or gangrene, recurrent: Secondary | ICD-10-CM | POA: Diagnosis not present

## 2017-09-24 DIAGNOSIS — R739 Hyperglycemia, unspecified: Secondary | ICD-10-CM | POA: Diagnosis not present

## 2017-09-24 DIAGNOSIS — E119 Type 2 diabetes mellitus without complications: Secondary | ICD-10-CM

## 2017-09-24 LAB — CBC WITH DIFFERENTIAL/PLATELET
Basophils Absolute: 39 cells/uL (ref 0–200)
Basophils Relative: 0.5 %
Eosinophils Absolute: 0 cells/uL — ABNORMAL LOW (ref 15–500)
Eosinophils Relative: 0 %
HCT: 39.3 % (ref 38.5–50.0)
Hemoglobin: 12.1 g/dL — ABNORMAL LOW (ref 13.2–17.1)
Lymphs Abs: 858 cells/uL (ref 850–3900)
MCH: 22.5 pg — ABNORMAL LOW (ref 27.0–33.0)
MCHC: 30.8 g/dL — ABNORMAL LOW (ref 32.0–36.0)
MCV: 73 fL — ABNORMAL LOW (ref 80.0–100.0)
MPV: 11.1 fL (ref 7.5–12.5)
Monocytes Relative: 6.8 %
Neutro Abs: 6373 cells/uL (ref 1500–7800)
Neutrophils Relative %: 81.7 %
Platelets: 257 10*3/uL (ref 140–400)
RBC: 5.38 10*6/uL (ref 4.20–5.80)
RDW: 19.5 % — ABNORMAL HIGH (ref 11.0–15.0)
Total Lymphocyte: 11 %
WBC mixed population: 530 cells/uL (ref 200–950)
WBC: 7.8 10*3/uL (ref 3.8–10.8)

## 2017-09-24 LAB — COMPLETE METABOLIC PANEL WITH GFR
AG Ratio: 1.6 (calc) (ref 1.0–2.5)
ALT: 39 U/L (ref 9–46)
AST: 42 U/L — ABNORMAL HIGH (ref 10–35)
Albumin: 4.5 g/dL (ref 3.6–5.1)
Alkaline phosphatase (APISO): 68 U/L (ref 40–115)
BUN/Creatinine Ratio: 13 (calc) (ref 6–22)
BUN: 21 mg/dL (ref 7–25)
CO2: 20 mmol/L (ref 20–32)
Calcium: 9.5 mg/dL (ref 8.6–10.3)
Chloride: 101 mmol/L (ref 98–110)
Creat: 1.59 mg/dL — ABNORMAL HIGH (ref 0.70–1.33)
GFR, Est African American: 55 mL/min/{1.73_m2} — ABNORMAL LOW (ref >=60)
GFR, Est Non African American: 47 mL/min/{1.73_m2} — ABNORMAL LOW (ref >=60)
Globulin: 2.9 g/dL (calc) (ref 1.9–3.7)
Glucose, Bld: 474 mg/dL — ABNORMAL HIGH (ref 65–99)
Potassium: 4.7 mmol/L (ref 3.5–5.3)
Sodium: 134 mmol/L — ABNORMAL LOW (ref 135–146)
Total Bilirubin: 0.9 mg/dL (ref 0.2–1.2)
Total Protein: 7.4 g/dL (ref 6.1–8.1)

## 2017-09-24 LAB — HEPATITIS C ANTIBODY
Hepatitis C Ab: NONREACTIVE
SIGNAL TO CUT-OFF: 0.03 (ref <1.00)

## 2017-09-24 LAB — GLUCOSE, POCT (MANUAL RESULT ENTRY): POC Glucose: 357 mg/dl — AB (ref 70–99)

## 2017-09-24 LAB — PSA: PSA: 0.8 ng/mL (ref <=4.0)

## 2017-09-24 LAB — LITHIUM LEVEL: Lithium Lvl: 0.3 mmol/L — ABNORMAL LOW (ref 0.6–1.2)

## 2017-09-24 LAB — TSH: TSH: 0.5 mIU/L (ref 0.40–4.50)

## 2017-09-24 LAB — POCT GLYCOSYLATED HEMOGLOBIN (HGB A1C): Hemoglobin A1C: 9.3

## 2017-09-24 MED ORDER — METFORMIN HCL 500 MG PO TABS: 500.0000 mg | ORAL_TABLET | Freq: Two times a day (BID) | ORAL | 3 refills | Status: DC

## 2017-09-24 NOTE — Telephone Encounter (Signed)
A1c was 9.3. Pt will call us back about when his hernia surgery to see if he needs to start insulin to get this in better control before his surgery.

## 2017-09-24 NOTE — Telephone Encounter (Signed)
Lab called with a critical lab. Pt blood sugar was 474. [name] is out of office. Could you look at this please? Thanks.

## 2017-09-24 NOTE — Telephone Encounter (Signed)
Tried to call pt. Unable to leave message.   [name] came by the office. Spoke with him. He wants pt to come back in for a finger stick and an A1c and go from there.

## 2017-09-24 NOTE — Telephone Encounter (Signed)
Spoke with pt. Informed him of results. He said that we would try to get by here today, if not it would be tomorrow.

## 2017-09-24 NOTE — Progress Notes (Signed)
Per [name] pt came by for a1c and finger stick. Metformin was sent in for new diagnoses of diabetes. Pt will call us about his up coming hernia operation to let us know when he his surgery is as we may need to start him on insulin to get it down for surgery.

## 2017-09-25 DIAGNOSIS — R1314 Dysphagia, pharyngoesophageal phase: Secondary | ICD-10-CM | POA: Diagnosis not present

## 2017-09-25 DIAGNOSIS — J301 Allergic rhinitis due to pollen: Secondary | ICD-10-CM | POA: Diagnosis not present

## 2017-10-05 ENCOUNTER — Other Ambulatory Visit: Payer: Self-pay | Admitting: Psychiatry

## 2017-10-10 DIAGNOSIS — J301 Allergic rhinitis due to pollen: Secondary | ICD-10-CM | POA: Diagnosis not present

## 2017-10-10 DIAGNOSIS — J3089 Other allergic rhinitis: Secondary | ICD-10-CM | POA: Diagnosis not present

## 2017-10-16 DIAGNOSIS — J3089 Other allergic rhinitis: Secondary | ICD-10-CM | POA: Diagnosis not present

## 2017-10-16 DIAGNOSIS — J301 Allergic rhinitis due to pollen: Secondary | ICD-10-CM | POA: Diagnosis not present

## 2017-10-20 ENCOUNTER — Telehealth: Payer: Self-pay

## 2017-10-20 ENCOUNTER — Other Ambulatory Visit: Payer: Self-pay

## 2017-10-20 DIAGNOSIS — Z1211 Encounter for screening for malignant neoplasm of colon: Secondary | ICD-10-CM

## 2017-10-20 NOTE — Telephone Encounter (Signed)
Gastroenterology Pre-Procedure Review  Request Date: 11/25/17 Requesting Physician: Dr. Allen Norris  PATIENT REVIEW QUESTIONS: The patient responded to the following health history questions as indicated:    1. Are you having any GI issues? no 2. Do you have a personal history of Polyps? yes (3-4 years ago) 3. Do you have a family history of Colon Cancer or Polyps? yes (dad polyps) 4. Diabetes Mellitus? yes (type 2) 5. Joint replacements in the past 12 months?yes (Knee Surgery February 2018, Hernia May 2018) 6. Major health problems in the past 3 months?no 7. Any artificial heart valves, MVP, or defibrillator?no    MEDICATIONS & ALLERGIES:    Patient reports the following regarding taking any anticoagulation/antiplatelet therapy:   Plavix, Coumadin, Eliquis, Xarelto, Lovenox, Pradaxa, Brilinta, or Effient? no Aspirin? no  Patient confirms/reports the following medications:  Current Outpatient Medications  Medication Sig Dispense Refill  . allopurinol (ZYLOPRIM) 100 MG tablet TAKE 1 TABLET(100 MG) BY MOUTH DAILY 30 tablet 11  . amLODipine (NORVASC) 10 MG tablet Take 5 mg by mouth daily.    [name] buPROPion (WELLBUTRIN SR) 150 MG 12 hr tablet Take 1 tablet (150 mg total) daily by mouth. 90 tablet 1  . cyclobenzaprine (FLEXERIL) 10 MG tablet Take 1 tablet (10 mg total) by mouth at bedtime. 14 tablet 0  . EPIPEN 2-PAK 0.3 MG/0.3ML SOAJ injection     . fluticasone (FLONASE) 50 MCG/ACT nasal spray Place 2 sprays into both nostrils daily. 16 g 1  . HYDROcodone-acetaminophen (NORCO) 10-325 MG tablet Take 1 tablet by mouth every 6 (six) hours as needed.    [name] levothyroxine (SYNTHROID, LEVOTHROID) 25 MCG tablet TAKE 1 TABLET(25 MCG) BY MOUTH DAILY BEFORE BREAKFAST 90 tablet 0  . lithium carbonate (ESKALITH) 450 MG CR tablet Take 1 tablet (450 mg total) every morning by mouth. 30 tablet 2  . loratadine (CLARITIN) 10 MG tablet Take 10 mg by mouth daily.    . metFORMIN (GLUCOPHAGE) 500 MG tablet Take 1 tablet  (500 mg total) by mouth 2 (two) times daily with a meal. 180 tablet 3  . Multiple Vitamin (MULTIVITAMIN WITH MINERALS) TABS Take 1 tablet by mouth daily.    [name] omeprazole (PRILOSEC) 20 MG capsule Take 20 mg by mouth daily.    [name] testosterone cypionate (DEPOTESTOTERONE CYPIONATE) 100 MG/ML injection Inject into the muscle every 14 (fourteen) days. Pt gets at doctor office; dose unknown.    . zolpidem (AMBIEN) 5 MG tablet Take 1 tablet (5 mg total) at bedtime as needed by mouth for sleep. 30 tablet 2   No current facility-administered medications for this visit.     Patient confirms/reports the following allergies:  Allergies  Allergen Reactions  . Tequin [Gatifloxacin] Anaphylaxis and Rash  . Amoxicillin Hives    No orders of the defined types were placed in this encounter.   AUTHORIZATION INFORMATION Primary Insurance: 1D#: Group #:  Secondary Insurance: 1D#: Group #:  SCHEDULE INFORMATION: Date: 11/25/17  Time: Location:[facility]

## 2017-10-21 DIAGNOSIS — J301 Allergic rhinitis due to pollen: Secondary | ICD-10-CM | POA: Diagnosis not present

## 2017-10-21 DIAGNOSIS — J3089 Other allergic rhinitis: Secondary | ICD-10-CM | POA: Diagnosis not present

## 2017-10-23 DIAGNOSIS — M5417 Radiculopathy, lumbosacral region: Secondary | ICD-10-CM | POA: Diagnosis not present

## 2017-10-27 ENCOUNTER — Other Ambulatory Visit: Payer: Self-pay

## 2017-10-27 ENCOUNTER — Encounter (HOSPITAL_BASED_OUTPATIENT_CLINIC_OR_DEPARTMENT_OTHER): Payer: Self-pay | Admitting: *Deleted

## 2017-10-27 ENCOUNTER — Encounter (HOSPITAL_BASED_OUTPATIENT_CLINIC_OR_DEPARTMENT_OTHER)
Admission: RE | Admit: 2017-10-27 | Discharge: 2017-10-27 | Disposition: A | Discharge status: Home / Self Care | Payer: 59 | Source: Ambulatory Visit | Attending: Surgery | Admitting: Surgery

## 2017-10-27 DIAGNOSIS — E039 Hypothyroidism, unspecified: Secondary | ICD-10-CM | POA: Insufficient documentation

## 2017-10-27 DIAGNOSIS — Z01812 Encounter for preprocedural laboratory examination: Secondary | ICD-10-CM | POA: Diagnosis present

## 2017-10-27 DIAGNOSIS — F518 Other sleep disorders not due to a substance or known physiological condition: Secondary | ICD-10-CM | POA: Diagnosis not present

## 2017-10-27 DIAGNOSIS — G4720 Circadian rhythm sleep disorder, unspecified type: Secondary | ICD-10-CM | POA: Insufficient documentation

## 2017-10-27 DIAGNOSIS — I10 Essential (primary) hypertension: Secondary | ICD-10-CM | POA: Insufficient documentation

## 2017-10-27 DIAGNOSIS — K429 Umbilical hernia without obstruction or gangrene: Secondary | ICD-10-CM | POA: Insufficient documentation

## 2017-10-27 DIAGNOSIS — Z01810 Encounter for preprocedural cardiovascular examination: Secondary | ICD-10-CM | POA: Diagnosis present

## 2017-10-27 DIAGNOSIS — E119 Type 2 diabetes mellitus without complications: Secondary | ICD-10-CM | POA: Diagnosis not present

## 2017-10-27 DIAGNOSIS — K4090 Unilateral inguinal hernia, without obstruction or gangrene, not specified as recurrent: Secondary | ICD-10-CM | POA: Diagnosis not present

## 2017-10-27 DIAGNOSIS — G4733 Obstructive sleep apnea (adult) (pediatric): Secondary | ICD-10-CM | POA: Insufficient documentation

## 2017-10-27 DIAGNOSIS — M109 Gout, unspecified: Secondary | ICD-10-CM | POA: Diagnosis not present

## 2017-10-27 LAB — BASIC METABOLIC PANEL
Anion gap: 11 (ref 5–15)
BUN: 11 mg/dL (ref 6–20)
CO2: 22 mmol/L (ref 22–32)
Calcium: 9.2 mg/dL (ref 8.9–10.3)
Chloride: 103 mmol/L (ref 101–111)
Creatinine, Ser: 0.99 mg/dL (ref 0.61–1.24)
GFR calc Af Amer: >60 mL/min (ref >60)
GFR calc non Af Amer: >60 mL/min (ref >60)
Glucose, Bld: 226 mg/dL — ABNORMAL HIGH (ref 65–99)
Potassium: 4.7 mmol/L (ref 3.5–5.1)
Sodium: 136 mmol/L (ref 135–145)

## 2017-10-27 NOTE — Progress Notes (Signed)
Patient went to PCP Dr Rosanna Randy on 09-23-17 for wellness visit and labs revealed Glu level 474 and HgbA1c 9.3. Patient was started on Metformin 500mg  bid. He will come in for BMET and EKG prior to surgery.

## 2017-10-28 DIAGNOSIS — J3089 Other allergic rhinitis: Secondary | ICD-10-CM | POA: Diagnosis not present

## 2017-10-28 DIAGNOSIS — J301 Allergic rhinitis due to pollen: Secondary | ICD-10-CM | POA: Diagnosis not present

## 2017-10-30 ENCOUNTER — Ambulatory Visit: Payer: Self-pay | Admitting: Surgery

## 2017-10-30 NOTE — H&P (Signed)
Jeremiah Jeremiah [name] Documented: 09/24/2017 4:17 PM Location: [place] Office Patient #: [id number] DOB: 17-Dec-1960 Married / Language: Jeremiah Jeremiah [name] / Race: White Male   History of Present Illness Jeremiah Jeremiah [name] B. Hassell Done MD; 09/24/2017 4:41 PM) The patient is a 57 year old male who presents with an inguinal hernia. The hernia(s) is/are located on both sides (The right is small but the left is a twice repaired recurrent symptomatic Left inguinal hernia).I saw him about this a few months ago. He is referred by Dr. Stacey Drain. He was having pain the other night and went to the emergency room. His last repair on the left side was laparoscopic and [name]. He was Jeremiah [name] earlier this year. I explained that an open report approach would be recommended and that he could possibly have an infarcted testicle as a result of this since this is the third surgery he's had. We'll proceed with open left inguinal hernia repair with mesh.   Allergies (Jeremiah Massenburg, LPN; 04/54/0981 1:91 PM) Tequin *FLUOROQUINOLONES*  Amoxicillin *PENICILLINS*  Allergies Reconciled   Medication History (Jeremiah Massenburg, LPN; 47/82/9562 1:30 PM) Allopurinol (100MG  Tablet, Oral) Active. AmLODIPine Besylate (10MG  Tablet, Oral) Active. BuPROPion HCl ER (SR) (150MG  Tablet ER 12HR, Oral) Active. EPINEPHrine (0.3MG /0.3ML Soln Auto-inj, Injection) Active. Fluticasone Propionate (50MCG/ACT Suspension, Nasal) Active. Levothyroxine Sodium (25MCG Tablet, Oral) Active. Lithium Carbonate ER (450MG  Tablet ER, Oral) Active. Zolpidem Tartrate (5MG  Tablet, Oral) Active. Loratadine (10MG  Tablet, Oral) Active. Multivitamin Adult (Oral) Active. Medications Reconciled  Vitals (Jeremiah Massenburg LPN; 86/57/8469 6:29 PM) 09/24/2017 4:17 PM Weight: 205.5 lb Height: 70in Body Surface Area: 2.11 m Body Mass Index: 29.49 kg/m  Temp.: 98.82F  Pulse: 96 (Regular)  Resp.: 18 (Unlabored)  P.OX: 97% (Room  air) BP: 126/82 (Sitting, Left Arm, Standard)       Physical Exam (Jeremiah Pfannenstiel B. Hassell Done MD; 09/24/2017 4:43 PM) General Note: WM NAD HEENT unremarkable Chest Heart Abdomen prior lap Nissen and lap LIH GU large left recurrent inguinal hernia   Male Genitourinary Note: small right inguinal hernia large bulging left recurrent inguinal hernia     Assessment & Plan Jeremiah Jeremiah [name] B. Hassell Done MD; 09/24/2017 4:44 PM) RECURRENT LEFT INGUINAL HERNIA (K40.91) Impression: We will schedule open left inguinal hernia repair with mesh under general anesthesia. I explained the risk and benefits of the procedure and he wants to go ahead and proceed with surgery.

## 2017-10-30 NOTE — Anesthesia Preprocedure Evaluation (Addendum)
Anesthesia Evaluation  Patient identified by MRN, date of birth, ID band Patient awake    Reviewed: Allergy & Precautions, H&P , NPO status   History of Anesthesia Complications (+) PONVNegative for: history of anesthetic complications  Airway Mallampati: II   Neck ROM: Full    Dental  (+) Loose,    Pulmonary sleep apnea and Continuous Positive Airway Pressure Ventilation ,    breath sounds clear to auscultation       Cardiovascular hypertension, Pt. on medications + Peripheral Vascular Disease   Rhythm:Regular Rate:Normal  ECG: NSR, rate 63   Neuro/Psych PSYCHIATRIC DISORDERS Anxiety Depression Bipolar Disorder    GI/Hepatic GERD  Medicated and Controlled,  Endo/Other  diabetes, Poorly ControlledHypothyroidism   Renal/GU      Musculoskeletal  (+) Arthritis ,   Abdominal   Peds  Hematology   Anesthesia Other Findings INCARCERATED LEFT INGUINAL HERNIA  Reproductive/Obstetrics                            Anesthesia Physical  Anesthesia Plan  ASA: III  Anesthesia Plan: General   Post-op Pain Management: GA combined w/ Regional for post-op pain   Induction: Intravenous  PONV Risk Score and Plan: 4 or greater and Midazolam, Dexamethasone, Ondansetron and Treatment may vary due to age or medical condition  Airway Management Planned: Oral ETT  Additional Equipment:   Intra-op Plan:   Post-operative Plan: Extubation in OR  Informed Consent: I have reviewed the patients History and Physical, chart, labs and discussed the procedure including the risks, benefits and alternatives for the proposed anesthesia with the patient or authorized representative who has indicated his/her understanding and acceptance.   Dental advisory given  Plan Discussed with: CRNA  Anesthesia Plan Comments:        Anesthesia Quick Evaluation

## 2017-10-31 ENCOUNTER — Other Ambulatory Visit: Payer: Self-pay

## 2017-10-31 ENCOUNTER — Encounter (HOSPITAL_BASED_OUTPATIENT_CLINIC_OR_DEPARTMENT_OTHER): Payer: Self-pay

## 2017-10-31 ENCOUNTER — Ambulatory Visit (HOSPITAL_BASED_OUTPATIENT_CLINIC_OR_DEPARTMENT_OTHER): Payer: 59 | Admitting: Anesthesiology

## 2017-10-31 ENCOUNTER — Encounter (HOSPITAL_BASED_OUTPATIENT_CLINIC_OR_DEPARTMENT_OTHER)
Admission: RE | Disposition: A | Discharge status: Home / Self Care | Payer: Self-pay | Source: Ambulatory Visit | Attending: Surgery

## 2017-10-31 ENCOUNTER — Ambulatory Visit (HOSPITAL_BASED_OUTPATIENT_CLINIC_OR_DEPARTMENT_OTHER)
Admission: RE | Admit: 2017-10-31 | Discharge: 2017-10-31 | Disposition: A | Discharge status: Home / Self Care | Payer: 59 | Source: Ambulatory Visit | Attending: Surgery | Admitting: Surgery

## 2017-10-31 DIAGNOSIS — E1165 Type 2 diabetes mellitus with hyperglycemia: Secondary | ICD-10-CM | POA: Insufficient documentation

## 2017-10-31 DIAGNOSIS — Z7951 Long term (current) use of inhaled steroids: Secondary | ICD-10-CM | POA: Diagnosis not present

## 2017-10-31 DIAGNOSIS — M170 Bilateral primary osteoarthritis of knee: Secondary | ICD-10-CM

## 2017-10-31 DIAGNOSIS — K429 Umbilical hernia without obstruction or gangrene: Secondary | ICD-10-CM | POA: Diagnosis not present

## 2017-10-31 DIAGNOSIS — K4090 Unilateral inguinal hernia, without obstruction or gangrene, not specified as recurrent: Secondary | ICD-10-CM | POA: Insufficient documentation

## 2017-10-31 DIAGNOSIS — G4730 Sleep apnea, unspecified: Secondary | ICD-10-CM | POA: Insufficient documentation

## 2017-10-31 DIAGNOSIS — E1151 Type 2 diabetes mellitus with diabetic peripheral angiopathy without gangrene: Secondary | ICD-10-CM | POA: Diagnosis not present

## 2017-10-31 DIAGNOSIS — K4091 Unilateral inguinal hernia, without obstruction or gangrene, recurrent: Secondary | ICD-10-CM | POA: Diagnosis not present

## 2017-10-31 DIAGNOSIS — Z79899 Other long term (current) drug therapy: Secondary | ICD-10-CM | POA: Diagnosis not present

## 2017-10-31 DIAGNOSIS — Z9989 Dependence on other enabling machines and devices: Secondary | ICD-10-CM | POA: Diagnosis not present

## 2017-10-31 DIAGNOSIS — I10 Essential (primary) hypertension: Secondary | ICD-10-CM | POA: Diagnosis not present

## 2017-10-31 HISTORY — DX: Adverse effect of unspecified anesthetic, initial encounter: T41.45XA

## 2017-10-31 HISTORY — DX: Other specified postprocedural states: Z98.890

## 2017-10-31 HISTORY — DX: Gastro-esophageal reflux disease without esophagitis: K21.9

## 2017-10-31 HISTORY — DX: Other complications of anesthesia, initial encounter: T88.59XA

## 2017-10-31 HISTORY — DX: Other specified postprocedural states: R11.2

## 2017-10-31 HISTORY — PX: INGUINAL HERNIA REPAIR: SHX194

## 2017-10-31 LAB — GLUCOSE, CAPILLARY
Glucose-Capillary: 179 mg/dL — ABNORMAL HIGH (ref 65–99)
Glucose-Capillary: 212 mg/dL — ABNORMAL HIGH (ref 65–99)

## 2017-10-31 SURGERY — REPAIR, HERNIA, INGUINAL, INCARCERATED
Anesthesia: General | Site: Inguinal | Laterality: Left

## 2017-10-31 MED ORDER — FENTANYL CITRATE (PF) 100 MCG/2ML IJ SOLN
INTRAMUSCULAR | Status: AC
  Filled 2017-10-31: qty 2

## 2017-10-31 MED ORDER — SUGAMMADEX SODIUM 200 MG/2ML IV SOLN
INTRAVENOUS | Status: AC
  Filled 2017-10-31: qty 2

## 2017-10-31 MED ORDER — CEFAZOLIN SODIUM-DEXTROSE 2-4 GM/100ML-% IV SOLN
2.0000 g | INTRAVENOUS | Status: AC
  Administered 2017-10-31: 2 g via INTRAVENOUS

## 2017-10-31 MED ORDER — GABAPENTIN 300 MG PO CAPS
ORAL_CAPSULE | ORAL | Status: AC
Start: 2017-10-31 — End: ?
  Filled 2017-10-31: qty 1

## 2017-10-31 MED ORDER — ONDANSETRON HCL 4 MG/2ML IJ SOLN
INTRAMUSCULAR | Status: DC | PRN
  Administered 2017-10-31: 4 mg via INTRAVENOUS

## 2017-10-31 MED ORDER — ROCURONIUM BROMIDE 100 MG/10ML IV SOLN
INTRAVENOUS | Status: DC | PRN
  Administered 2017-10-31: 40 mg via INTRAVENOUS

## 2017-10-31 MED ORDER — FENTANYL CITRATE (PF) 100 MCG/2ML IJ SOLN
INTRAMUSCULAR | Status: DC | PRN
  Administered 2017-10-31: 100 ug via INTRAVENOUS

## 2017-10-31 MED ORDER — SODIUM BICARBONATE 4 % IV SOLN
INTRAVENOUS | Status: AC
  Filled 2017-10-31: qty 10

## 2017-10-31 MED ORDER — MIDAZOLAM HCL 2 MG/2ML IJ SOLN
INTRAMUSCULAR | Status: AC
  Filled 2017-10-31: qty 2

## 2017-10-31 MED ORDER — FENTANYL CITRATE (PF) 100 MCG/2ML IJ SOLN
25.0000 ug | INTRAMUSCULAR | Status: DC | PRN
  Administered 2017-10-31 (×2): 50 ug via INTRAVENOUS

## 2017-10-31 MED ORDER — HYDROCODONE-ACETAMINOPHEN 10-325 MG PO TABS: 1.0000 | ORAL_TABLET | ORAL | 0 refills | Status: AC | PRN

## 2017-10-31 MED ORDER — ONDANSETRON HCL 4 MG/2ML IJ SOLN
INTRAMUSCULAR | Status: AC
  Filled 2017-10-31: qty 2

## 2017-10-31 MED ORDER — PHENYLEPHRINE 40 MCG/ML (10ML) SYRINGE FOR IV PUSH (FOR BLOOD PRESSURE SUPPORT)
PREFILLED_SYRINGE | INTRAVENOUS | Status: AC
  Filled 2017-10-31: qty 10

## 2017-10-31 MED ORDER — MIDAZOLAM HCL 5 MG/5ML IJ SOLN
INTRAMUSCULAR | Status: DC | PRN
  Administered 2017-10-31: 2 mg via INTRAVENOUS

## 2017-10-31 MED ORDER — SCOPOLAMINE 1 MG/3DAYS TD PT72: 1.0000 | MEDICATED_PATCH | Freq: Once | TRANSDERMAL | Status: DC | PRN

## 2017-10-31 MED ORDER — PROPOFOL 500 MG/50ML IV EMUL
INTRAVENOUS | Status: AC
  Filled 2017-10-31: qty 50

## 2017-10-31 MED ORDER — DEXAMETHASONE SODIUM PHOSPHATE 10 MG/ML IJ SOLN
INTRAMUSCULAR | Status: AC
  Filled 2017-10-31: qty 1

## 2017-10-31 MED ORDER — LIDOCAINE 2% (20 MG/ML) 5 ML SYRINGE
INTRAMUSCULAR | Status: AC
  Filled 2017-10-31: qty 5

## 2017-10-31 MED ORDER — FENTANYL CITRATE (PF) 100 MCG/2ML IJ SOLN
INTRAMUSCULAR | Status: AC
Start: 2017-10-31 — End: ?
  Filled 2017-10-31: qty 2

## 2017-10-31 MED ORDER — CHLORHEXIDINE GLUCONATE CLOTH 2 % EX PADS: 6.0000 | MEDICATED_PAD | Freq: Once | CUTANEOUS | Status: DC

## 2017-10-31 MED ORDER — SODIUM CHLORIDE 0.9 % IJ SOLN
INTRAMUSCULAR | Status: AC
  Filled 2017-10-31: qty 10

## 2017-10-31 MED ORDER — DEXAMETHASONE SODIUM PHOSPHATE 4 MG/ML IJ SOLN
INTRAMUSCULAR | Status: DC | PRN
  Administered 2017-10-31: 10 mg via INTRAVENOUS

## 2017-10-31 MED ORDER — SUGAMMADEX SODIUM 200 MG/2ML IV SOLN
INTRAVENOUS | Status: DC | PRN
  Administered 2017-10-31: 200 mg via INTRAVENOUS

## 2017-10-31 MED ORDER — CELECOXIB 200 MG PO CAPS
ORAL_CAPSULE | ORAL | Status: AC
  Filled 2017-10-31: qty 1

## 2017-10-31 MED ORDER — OXYCODONE HCL 5 MG PO TABS
ORAL_TABLET | ORAL | Status: AC
  Filled 2017-10-31: qty 1

## 2017-10-31 MED ORDER — MIDAZOLAM HCL 2 MG/2ML IJ SOLN: 1.0000 mg | INTRAMUSCULAR | Status: DC | PRN

## 2017-10-31 MED ORDER — METHYLENE BLUE 0.5 % INJ SOLN
INTRAVENOUS | Status: AC
  Filled 2017-10-31: qty 10

## 2017-10-31 MED ORDER — FENTANYL CITRATE (PF) 100 MCG/2ML IJ SOLN: 50.0000 ug | INTRAMUSCULAR | Status: DC | PRN

## 2017-10-31 MED ORDER — GABAPENTIN 300 MG PO CAPS
300.0000 mg | ORAL_CAPSULE | ORAL | Status: AC
  Administered 2017-10-31: 300 mg via ORAL

## 2017-10-31 MED ORDER — PHENYLEPHRINE HCL 10 MG/ML IJ SOLN
INTRAMUSCULAR | Status: DC | PRN
  Administered 2017-10-31 (×3): 80 ug via INTRAVENOUS

## 2017-10-31 MED ORDER — BUPIVACAINE LIPOSOME 1.3 % IJ SUSP
INTRAMUSCULAR | Status: DC | PRN
  Administered 2017-10-31: 20 mL

## 2017-10-31 MED ORDER — LIDOCAINE HCL (PF) 1 % IJ SOLN
INTRAMUSCULAR | Status: AC
  Filled 2017-10-31: qty 60

## 2017-10-31 MED ORDER — OXYCODONE HCL 5 MG PO TABS
5.0000 mg | ORAL_TABLET | ORAL | Status: DC | PRN
  Administered 2017-10-31: 5 mg via ORAL

## 2017-10-31 MED ORDER — ONDANSETRON HCL 4 MG/2ML IJ SOLN: 4.0000 mg | Freq: Once | INTRAMUSCULAR | Status: DC | PRN

## 2017-10-31 MED ORDER — ACETAMINOPHEN 500 MG PO TABS
1000.0000 mg | ORAL_TABLET | ORAL | Status: AC
  Administered 2017-10-31: 1000 mg via ORAL

## 2017-10-31 MED ORDER — ROPIVACAINE HCL 5 MG/ML IJ SOLN
INTRAMUSCULAR | Status: DC | PRN
  Administered 2017-10-31: 20 mL via PERINEURAL

## 2017-10-31 MED ORDER — CEFAZOLIN SODIUM-DEXTROSE 2-4 GM/100ML-% IV SOLN
INTRAVENOUS | Status: AC
  Filled 2017-10-31: qty 100

## 2017-10-31 MED ORDER — LACTATED RINGERS IV SOLN
INTRAVENOUS | Status: DC
  Administered 2017-10-31 (×3): via INTRAVENOUS

## 2017-10-31 MED ORDER — HEPARIN SODIUM (PORCINE) 5000 UNIT/ML IJ SOLN
5000.0000 [IU] | Freq: Once | INTRAMUSCULAR | Status: AC
  Administered 2017-10-31: 5000 [IU] via SUBCUTANEOUS

## 2017-10-31 MED ORDER — LIDOCAINE HCL (CARDIAC) 20 MG/ML IV SOLN
INTRAVENOUS | Status: DC | PRN
  Administered 2017-10-31: 10 mg via INTRAVENOUS

## 2017-10-31 MED ORDER — ACETAMINOPHEN 500 MG PO TABS
ORAL_TABLET | ORAL | Status: AC
  Filled 2017-10-31: qty 2

## 2017-10-31 MED ORDER — HEPARIN SODIUM (PORCINE) 5000 UNIT/ML IJ SOLN
INTRAMUSCULAR | Status: AC
  Filled 2017-10-31: qty 1

## 2017-10-31 MED ORDER — CELECOXIB 200 MG PO CAPS
200.0000 mg | ORAL_CAPSULE | ORAL | Status: AC
  Administered 2017-10-31: 200 mg via ORAL

## 2017-10-31 MED ORDER — ROCURONIUM BROMIDE 10 MG/ML (PF) SYRINGE
PREFILLED_SYRINGE | INTRAVENOUS | Status: AC
Start: 2017-10-31 — End: ?
  Filled 2017-10-31: qty 5

## 2017-10-31 MED ORDER — BUPIVACAINE HCL (PF) 0.25 % IJ SOLN
INTRAMUSCULAR | Status: AC
  Filled 2017-10-31: qty 60

## 2017-10-31 SURGICAL SUPPLY — 52 items
ADH SKN CLS APL DERMABOND .7 (GAUZE/BANDAGES/DRESSINGS)
APL SKNCLS STERI-STRIP NONHPOA (GAUZE/BANDAGES/DRESSINGS)
BENZOIN TINCTURE PRP APPL 2/3 (GAUZE/BANDAGES/DRESSINGS) IMPLANT
BLADE CLIPPER SURG (BLADE) IMPLANT
BLADE SURG 15 STRL LF DISP TIS (BLADE) ×1 IMPLANT
BLADE SURG 15 STRL SS (BLADE) ×2
CANISTER SUCT 1200ML W/VALVE (MISCELLANEOUS) ×2 IMPLANT
CLEANER CAUTERY TIP 5X5 PAD (MISCELLANEOUS) ×1 IMPLANT
COVER BACK TABLE 60X90IN (DRAPES) ×2 IMPLANT
COVER MAYO STAND STRL (DRAPES) ×2 IMPLANT
DECANTER SPIKE VIAL GLASS SM (MISCELLANEOUS) ×2 IMPLANT
DERMABOND ADVANCED (GAUZE/BANDAGES/DRESSINGS)
DERMABOND ADVANCED .7 DNX12 (GAUZE/BANDAGES/DRESSINGS) IMPLANT
DRAIN PENROSE 1/2X12 LTX STRL (WOUND CARE) ×2 IMPLANT
DRAPE LAPAROTOMY T 102X78X121 (DRAPES) ×2 IMPLANT
ELECT REM PT RETURN 9FT ADLT (ELECTROSURGICAL) ×2
ELECTRODE REM PT RTRN 9FT ADLT (ELECTROSURGICAL) ×1 IMPLANT
GAUZE SPONGE 4X4 12PLY STRL LF (GAUZE/BANDAGES/DRESSINGS) ×4 IMPLANT
GAUZE SPONGE 4X4 16PLY XRAY LF (GAUZE/BANDAGES/DRESSINGS) IMPLANT
GLOVE BIO SURGEON STRL SZ7 (GLOVE) ×1 IMPLANT
GLOVE BIO SURGEON STRL SZ8 (GLOVE) ×2 IMPLANT
GLOVE BIOGEL PI IND STRL 8 (GLOVE) IMPLANT
GLOVE BIOGEL PI INDICATOR 8 (GLOVE) ×1
GOWN STRL REUS W/ TWL LRG LVL3 (GOWN DISPOSABLE) ×1 IMPLANT
GOWN STRL REUS W/ TWL XL LVL3 (GOWN DISPOSABLE) ×1 IMPLANT
GOWN STRL REUS W/TWL LRG LVL3 (GOWN DISPOSABLE) ×2
GOWN STRL REUS W/TWL XL LVL3 (GOWN DISPOSABLE) ×2
MESH HERNIA 3X6 (Mesh General) ×1 IMPLANT
NDL HYPO 25X1 1.5 SAFETY (NEEDLE) IMPLANT
NEEDLE HYPO 25X1 1.5 SAFETY (NEEDLE) IMPLANT
NS IRRIG 1000ML POUR BTL (IV SOLUTION) ×2 IMPLANT
PACK BASIN DAY SURGERY FS (CUSTOM PROCEDURE TRAY) ×2 IMPLANT
PAD CLEANER CAUTERY TIP 5X5 (MISCELLANEOUS) ×1
PENCIL BUTTON HOLSTER BLD 10FT (ELECTRODE) ×2 IMPLANT
SLEEVE SCD COMPRESS KNEE MED (MISCELLANEOUS) IMPLANT
STAPLER VISISTAT 35W (STAPLE) IMPLANT
STRIP CLOSURE SKIN 1/2X4 (GAUZE/BANDAGES/DRESSINGS) IMPLANT
SUT MON AB 5-0 PS2 18 (SUTURE) IMPLANT
SUT PROLENE 2 0 CT2 30 (SUTURE) ×4 IMPLANT
SUT SILK 2 0 SH (SUTURE) IMPLANT
SUT VIC AB 2-0 SH 27 (SUTURE) ×2
SUT VIC AB 2-0 SH 27XBRD (SUTURE) ×1 IMPLANT
SUT VIC AB 4-0 SH 18 (SUTURE) ×2 IMPLANT
SUT VIC AB 5-0 P-3 18X BRD (SUTURE) IMPLANT
SUT VIC AB 5-0 P3 18 (SUTURE)
SUT VICRYL 4-0 PS2 18IN ABS (SUTURE) IMPLANT
SYR BULB 3OZ (MISCELLANEOUS) ×2 IMPLANT
SYR CONTROL 10ML LL (SYRINGE) IMPLANT
TOWEL OR 17X24 6PK STRL BLUE (TOWEL DISPOSABLE) ×4 IMPLANT
TRAY DSU PREP LF (CUSTOM PROCEDURE TRAY) ×2 IMPLANT
TUBE CONNECTING 20X1/4 (TUBING) ×2 IMPLANT
YANKAUER SUCT BULB TIP NO VENT (SUCTIONS) ×2 IMPLANT

## 2017-10-31 NOTE — Op Note (Signed)
Surgeon: Kaylyn Lim, MD, FACS  Asst:  none  Anes:  general  Preop Dx: Thrice recurrent left inguinal hernia Postop Dx: Large direct recurrence  Procedure: Open dissection of inguinal anatomy and delineation of anatomy; excision of hernia sac, primary repair of the floor incorporating the Ultrapro mesh that was placed initially and repair of the floor with Marlex type mesh Location Surgery: CDS # 8 Complications: None noted  EBL:   15 cc  Drains: none  Description of Procedure:  The patient was taken to OR 8 .  After anesthesia was administered and the patient was prepped a timeout was performed.  The previous oblique incision was excised and the subcut dissected down to the large bulge that exited the external ring.  After reduction it appeared to be about 2 fingerbreaths.  After opening the external oblique, I freed the large sac from the surrounding dense scar tissue and  I opened the sac and with my finger in the abdomen I could feel the tacs and mesh that appeared to have rolled up allowing the recurrent hernia.  The colon was palpated at the edge of the sac and at that level I sewed the sac closed with a horizontal mattress of 2-0 silk and excised the excess sac.  I placed a penrose drain around the cord structures as I found them and attempted to preserve them all.  Next I dissected the Ultrapro mesh from the underlying scar.    I identified the scar inferiorly and I sutured the edge of the Ultrapro to this scar tissue to effectively close the floor defect.  I dissected the inguinal ligament medially and laterally and then sutured a piece of Marlex type mesh to the ligament with a running 2-0 prolene and then cut it to fit the floor.  I sutured it medially with another running prolene 2-0.  I took the tails around the cord structures and sutured to itself.    20 cc of Exparel was injected into the surrounding tissues after I closed the external oblique with a running 2-0 Vicryl.  The  wound was irrigated with saline and closed in layers with 4-0 Vicryl and running 4-0 Monocryl.  Dermabond was applied.    The patient tolerated the procedure well and was taken to the PACU in stable condition.     Matt B. Hassell Done, [place], [facility], [place]

## 2017-10-31 NOTE — Anesthesia Procedure Notes (Addendum)
Anesthesia Regional Block: TAP block   Pre-Anesthetic Checklist: ,, timeout performed, Correct Patient, Correct Site, Correct Laterality, Correct Procedure,, site marked, risks and benefits discussed, Surgical consent,  Pre-op evaluation,  At surgeon's request and post-op pain management  Laterality: Left  Prep: chloraprep       Needles:  Injection technique: Single-shot  Needle Type: Echogenic Stimulator Needle     Needle Length: 9cm  Needle Gauge: 21     Additional Needles:   Procedures:,,,, ultrasound used (permanent image in chart),,,,  Narrative:  Start time: 10/31/2017 7:45 AM End time: 10/31/2017 7:55 AM Injection made incrementally with aspirations every 5 mL.  Performed by: Personally  Anesthesiologist: Murvin Natal, MD  Additional Notes: Functioning IV was confirmed and monitors were applied.  A 36mm 21ga Arrow echogenic stimulator needle was used. Sterile prep, hand hygiene and sterile gloves were used.  Negative aspiration and negative test dose prior to incremental administration of local anesthetic. The patient tolerated the procedure well.

## 2017-10-31 NOTE — Anesthesia Postprocedure Evaluation (Signed)
Anesthesia Post Note  Patient: Jeremiah [name]  Procedure(s) Performed: OPEN LEFT INGUINAL RECURRENT HERNIA REPAIR (Left Inguinal)     Patient location during evaluation: PACU Anesthesia Type: General and Regional Level of consciousness: awake and alert Pain management: pain level controlled Vital Signs Assessment: post-procedure vital signs reviewed and stable Respiratory status: spontaneous breathing, nonlabored ventilation, respiratory function stable and patient connected to nasal cannula oxygen Cardiovascular status: blood pressure returned to baseline and stable Postop Assessment: no apparent nausea or vomiting Anesthetic complications: no    Last Vitals:  Vitals:   10/31/17 1200 10/31/17 1213  BP:  (!) 141/90  Pulse: 72 77  Resp:  18  Temp:  (!) 36.4 C  SpO2: 96% 96%    Last Pain:  Vitals:   10/31/17 1213  TempSrc:   PainSc: 3                  Jeff Mccallum P Brailey Buescher

## 2017-10-31 NOTE — Interval H&P Note (Signed)
History and Physical Interval Note:  10/31/2017 7:33 AM  Jeremiah [name]  has presented today for surgery, with the diagnosis of INCARCERATED LEFT INGUINAL HERNIA  The various methods of treatment have been discussed with the patient and family. After consideration of risks, benefits and other options for treatment, the patient has consented to  Procedure(s): OPEN LEFT INGUINAL RECURRENT HERNIA REPAIR (Left) as a surgical intervention .  The patient's history has been reviewed, patient examined, no change in status, stable for surgery.  I have reviewed the patient's chart and labs.  Questions were answered to the patient's satisfaction.     Pedro Earls

## 2017-10-31 NOTE — Transfer of Care (Signed)
Immediate Anesthesia Transfer of Care Note  Patient: Jeremiah [name]  Procedure(s) Performed: OPEN LEFT INGUINAL RECURRENT HERNIA REPAIR (Left Inguinal)  Patient Location: PACU  Anesthesia Type:GA combined with regional for post-op pain  Level of Consciousness: awake and patient cooperative  Airway & Oxygen Therapy: Patient Spontanous Breathing and Patient connected to face mask oxygen  Post-op Assessment: Report given to RN and Post -op Vital signs reviewed and stable  Post vital signs: Reviewed and stable  Last Vitals:  Vitals:   10/31/17 0634  BP: 135/79  Pulse: 66  Resp: 20  Temp: 36.6 C  SpO2: 99%    Last Pain:  Vitals:   10/31/17 0634  TempSrc: Oral  PainSc: 4       Patients Stated Pain Goal: 2 (50/72/25 7505)  Complications: No apparent anesthesia complications

## 2017-10-31 NOTE — Discharge Instructions (Signed)
OXYCODONE 5 MG PO GIVEN at 11:28 am      Inguinal Hernia, Adult An inguinal hernia is when fat or the intestines push through the area where the leg meets the lower abdomen (groin) and create a rounded lump (bulge). This condition develops over time. There are three types of inguinal hernias. These types include:  Hernias that can be pushed back into the belly (are reducible).  Hernias that are not reducible (are incarcerated).  Hernias that are not reducible and lose their blood supply (are strangulated). This type of hernia requires emergency surgery.  What are the causes? This condition is caused by having a weak spot in the muscles or tissue. This weakness lets the hernia poke through. This condition can be triggered by:  Suddenly straining the muscles of the lower abdomen.  Lifting heavy objects.  Straining to have a bowel movement. Difficult bowel movements (constipation) can lead to this.  Coughing.  What increases the risk? This condition is more likely to develop in:  Men.  Pregnant women.  People who: ? Are overweight. ? Work in jobs that require long periods of standing or heavy lifting. ? Have had an inguinal hernia before. ? Smoke or have lung disease. These factors can lead to long-lasting (chronic) coughing.  What are the signs or symptoms? Symptoms can depend on the size of the hernia. Often, a small inguinal hernia has no symptoms. Symptoms of a larger hernia include:  A lump in the groin. This is easier to see when the person is standing. It might not be visible when he or she is lying down.  Pain or burning in the groin. This occurs especially when lifting, straining, or coughing.  A dull ache or a feeling of pressure in the groin.  A lump in the scrotum in men.  Symptoms of a strangulated inguinal hernia can include:  A bulge in the groin that is very painful and tender to the touch.  A bulge that turns red or purple.  Fever, nausea, and  vomiting.  The inability to have a bowel movement or to pass gas.  How is this diagnosed? This condition is diagnosed with a medical history and physical exam. Your health care provider may feel your groin area and ask you to cough. How is this treated? Treatment for this condition varies depending on the size of your hernia and whether you have symptoms. If you do not have symptoms, your health care provider may have you watch your hernia carefully and come in for follow-up visits. If your hernia is larger or if you have symptoms, your treatment will include surgery. Follow these instructions at home: Lifestyle  Drink enough fluid to keep your urine clear or pale yellow.  Eat a diet that includes a lot of fiber. Eat plenty of fruits, vegetables, and whole grains. Talk with your health care provider if you have questions.  Avoid lifting heavy objects.  Avoid standing for long periods of time.  Do not use tobacco products, including cigarettes, chewing tobacco, or e-cigarettes. If you need help quitting, ask your health care provider.  Maintain a healthy weight. General instructions  Do not try to force the hernia back in.  Watch your hernia for any changes in color or size. Let your health care provider know if any changes occur.  Take over-the-counter and prescription medicines only as told by your health care provider.  Keep all follow-up visits as told by your health care provider. This is important. Contact  a health care provider if:  You have a fever.  You have new symptoms.  Your symptoms get worse. Get help right away if:  You have pain in the groin that suddenly gets worse.  A bulge in the groin gets bigger suddenly and does not go down.  You are a man and you have a sudden pain in the scrotum, or the size of your scrotum suddenly changes.  A bulge in the groin area becomes red or purple and is painful to the touch.  You have nausea or vomiting that does not go  away.  You feel your heart beating a lot more quickly than normal.  You cannot have a bowel movement or pass gas. This information is not intended to replace advice given to you by your health care provider. Make sure you discuss any questions you have with your health care provider. Document Released: 02/09/2009 Document Revised: 02/29/2016 Document Reviewed: 08/03/2014 Elsevier Interactive Patient Education  2018 [place] [facility] Care Instructions  Activity: Get plenty of rest for the remainder of the day. A responsible individual must stay with you for 24 hours following the procedure.  For the next 24 hours, DO NOT: -Drive a car -Paediatric nurse -Drink alcoholic beverages -Take any medication unless instructed by your physician -Make any legal decisions or sign important papers.  Meals: Start with liquid foods such as gelatin or soup. Progress to regular foods as tolerated. Avoid greasy, spicy, heavy foods. If nausea and/or vomiting occur, drink only clear liquids until the nausea and/or vomiting subsides. Call your physician if vomiting continues.  Special Instructions/Symptoms: Your throat may feel dry or sore from the anesthesia or the breathing tube placed in your throat during surgery. If this causes discomfort, gargle with warm salt water. The discomfort should disappear within 24 hours.  If you had a scopolamine patch placed behind your ear for the management of post- operative nausea and/or vomiting:  1. The medication in the patch is effective for 72 hours, after which it should be removed.  Wrap patch in a tissue and discard in the trash. Wash hands thoroughly with soap and water. 2. You may remove the patch earlier than 72 hours if you experience unpleasant side effects which may include dry mouth, dizziness or visual disturbances. 3. Avoid touching the patch. Wash your hands with soap and water after contact with the patch.     Information for  Discharge Teaching: EXPAREL (bupivacaine liposome injectable suspension)   Your surgeon gave you EXPAREL(bupivacaine) in your surgical incision to help control your pain after surgery.   EXPAREL is a local anesthetic that provides pain relief by numbing the tissue around the surgical site.  EXPAREL is designed to release pain medication over time and can control pain for up to 72 hours.  Depending on how you respond to EXPAREL, you may require less pain medication during your recovery.  Possible side effects:  Temporary loss of sensation or ability to move in the area where bupivacaine was injected.  Nausea, vomiting, constipation  Rarely, numbness and tingling in your mouth or lips, lightheadedness, or anxiety may occur.  Call your doctor right away if you think you may be experiencing any of these sensations, or if you have other questions regarding possible side effects.  Follow all other discharge instructions given to you by your surgeon or nurse. Eat a healthy diet and drink plenty of water or other fluids.  If you return to the hospital for any  reason within 96 hours following the administration of EXPAREL, please inform your health care providers.

## 2017-10-31 NOTE — Anesthesia Procedure Notes (Signed)
Procedure Name: Intubation Date/Time: 10/31/2017 7:48 AM Performed by: Marrianne Mood, CRNA Pre-anesthesia Checklist: Patient identified, Emergency Drugs available, Suction available, Patient being monitored and Timeout performed Patient Re-evaluated:Patient Re-evaluated prior to induction Oxygen Delivery Method: Circle system utilized Preoxygenation: Pre-oxygenation with 100% oxygen Induction Type: IV induction Ventilation: Mask ventilation without difficulty Laryngoscope Size: Glidescope, 3 and Mac Grade View: Grade III Tube type: Oral Tube size: 8.0 mm Number of attempts: 1 Airway Equipment and Method: Stylet and Oral airway Placement Confirmation: ETT inserted through vocal cords under direct vision,  positive ETCO2 and breath sounds checked- equal and bilateral Secured at: 22 cm Tube secured with: Tape Dental Injury: Teeth and Oropharynx as per pre-operative assessment  Difficulty Due To: Difficult Airway- due to large tongue, Difficulty was anticipated and Difficult Airway- due to dentition

## 2017-11-03 ENCOUNTER — Encounter (HOSPITAL_BASED_OUTPATIENT_CLINIC_OR_DEPARTMENT_OTHER): Payer: Self-pay | Admitting: Surgery

## 2017-11-13 DIAGNOSIS — J301 Allergic rhinitis due to pollen: Secondary | ICD-10-CM | POA: Diagnosis not present

## 2017-11-13 DIAGNOSIS — J3089 Other allergic rhinitis: Secondary | ICD-10-CM | POA: Diagnosis not present

## 2017-11-19 ENCOUNTER — Ambulatory Visit (INDEPENDENT_AMBULATORY_CARE_PROVIDER_SITE_OTHER): Payer: 59

## 2017-11-19 DIAGNOSIS — E291 Testicular hypofunction: Secondary | ICD-10-CM | POA: Diagnosis not present

## 2017-11-19 MED ORDER — TESTOSTERONE UNDECANOATE 750 MG/3ML IM SOLN
750.0000 mg | Freq: Once | INTRAMUSCULAR | Status: AC
  Administered 2017-11-19: 750 mg via INTRAMUSCULAR

## 2017-11-19 NOTE — Progress Notes (Signed)
Aveed Injection  Due to Hypogonadism patient is present today for a Aveed Injection.  Medication: Aveed Dose: 750mg / 69ml EOF:[id number] Exp:05/2021 Location: right upper outer buttocks Injection of medication was given over a 60 second period of time.  Patient tolerated well, no complications were noted Patient remained in the office for 30 minutes post injection for monitoring and will report any side effects.  Preformed by: Fonnie Jarvis, CMA  Additional notes/ Follow up: 10wk

## 2017-11-25 ENCOUNTER — Ambulatory Visit
Admission: RE | Admit: 2017-11-25 | Discharge: 2017-11-25 | Disposition: A | Discharge status: Home / Self Care | Payer: 59 | Source: Ambulatory Visit | Attending: Gastroenterology | Admitting: Gastroenterology

## 2017-11-25 ENCOUNTER — Ambulatory Visit: Payer: 59 | Admitting: Anesthesiology

## 2017-11-25 ENCOUNTER — Encounter
Admission: RE | Disposition: A | Discharge status: Home / Self Care | Payer: Self-pay | Source: Ambulatory Visit | Attending: Gastroenterology

## 2017-11-25 ENCOUNTER — Encounter: Payer: Self-pay | Admitting: Certified Registered Nurse Anesthetist

## 2017-11-25 DIAGNOSIS — I10 Essential (primary) hypertension: Secondary | ICD-10-CM | POA: Insufficient documentation

## 2017-11-25 DIAGNOSIS — K529 Noninfective gastroenteritis and colitis, unspecified: Secondary | ICD-10-CM | POA: Diagnosis not present

## 2017-11-25 DIAGNOSIS — G4730 Sleep apnea, unspecified: Secondary | ICD-10-CM | POA: Diagnosis not present

## 2017-11-25 DIAGNOSIS — M109 Gout, unspecified: Secondary | ICD-10-CM | POA: Diagnosis not present

## 2017-11-25 DIAGNOSIS — K648 Other hemorrhoids: Secondary | ICD-10-CM | POA: Insufficient documentation

## 2017-11-25 DIAGNOSIS — F319 Bipolar disorder, unspecified: Secondary | ICD-10-CM | POA: Diagnosis not present

## 2017-11-25 DIAGNOSIS — Z96653 Presence of artificial knee joint, bilateral: Secondary | ICD-10-CM | POA: Insufficient documentation

## 2017-11-25 DIAGNOSIS — Z7984 Long term (current) use of oral hypoglycemic drugs: Secondary | ICD-10-CM | POA: Diagnosis not present

## 2017-11-25 DIAGNOSIS — Z1211 Encounter for screening for malignant neoplasm of colon: Secondary | ICD-10-CM

## 2017-11-25 DIAGNOSIS — D120 Benign neoplasm of cecum: Secondary | ICD-10-CM

## 2017-11-25 DIAGNOSIS — Z86010 Personal history of colonic polyps: Secondary | ICD-10-CM

## 2017-11-25 DIAGNOSIS — K219 Gastro-esophageal reflux disease without esophagitis: Secondary | ICD-10-CM | POA: Insufficient documentation

## 2017-11-25 DIAGNOSIS — D122 Benign neoplasm of ascending colon: Secondary | ICD-10-CM | POA: Diagnosis not present

## 2017-11-25 DIAGNOSIS — Z79899 Other long term (current) drug therapy: Secondary | ICD-10-CM | POA: Insufficient documentation

## 2017-11-25 DIAGNOSIS — Z79890 Hormone replacement therapy (postmenopausal): Secondary | ICD-10-CM | POA: Insufficient documentation

## 2017-11-25 DIAGNOSIS — K5730 Diverticulosis of large intestine without perforation or abscess without bleeding: Secondary | ICD-10-CM | POA: Diagnosis not present

## 2017-11-25 DIAGNOSIS — E119 Type 2 diabetes mellitus without complications: Secondary | ICD-10-CM | POA: Diagnosis not present

## 2017-11-25 DIAGNOSIS — E039 Hypothyroidism, unspecified: Secondary | ICD-10-CM | POA: Diagnosis not present

## 2017-11-25 DIAGNOSIS — K635 Polyp of colon: Secondary | ICD-10-CM | POA: Insufficient documentation

## 2017-11-25 DIAGNOSIS — D123 Benign neoplasm of transverse colon: Secondary | ICD-10-CM | POA: Diagnosis not present

## 2017-11-25 DIAGNOSIS — F419 Anxiety disorder, unspecified: Secondary | ICD-10-CM | POA: Insufficient documentation

## 2017-11-25 HISTORY — PX: COLONOSCOPY WITH PROPOFOL: SHX5780

## 2017-11-25 LAB — GLUCOSE, CAPILLARY: Glucose-Capillary: 173 mg/dL — ABNORMAL HIGH (ref 65–99)

## 2017-11-25 SURGERY — COLONOSCOPY WITH PROPOFOL
Anesthesia: General

## 2017-11-25 MED ORDER — SODIUM CHLORIDE 0.9 % IV SOLN
INTRAVENOUS | Status: DC
  Administered 2017-11-25: 1000 mL via INTRAVENOUS

## 2017-11-25 MED ORDER — LIDOCAINE HCL (CARDIAC) 20 MG/ML IV SOLN
INTRAVENOUS | Status: DC | PRN
  Administered 2017-11-25: 50 mg via INTRAVENOUS

## 2017-11-25 MED ORDER — PROPOFOL 500 MG/50ML IV EMUL
INTRAVENOUS | Status: AC
  Filled 2017-11-25: qty 50

## 2017-11-25 MED ORDER — PROPOFOL 10 MG/ML IV BOLUS
INTRAVENOUS | Status: DC | PRN
  Administered 2017-11-25: 100 mg via INTRAVENOUS

## 2017-11-25 MED ORDER — PROPOFOL 500 MG/50ML IV EMUL
INTRAVENOUS | Status: DC | PRN
  Administered 2017-11-25: 130 ug/kg/min via INTRAVENOUS

## 2017-11-25 MED ORDER — LIDOCAINE HCL (PF) 2 % IJ SOLN
INTRAMUSCULAR | Status: AC
  Filled 2017-11-25: qty 10

## 2017-11-25 NOTE — Anesthesia Post-op Follow-up Note (Signed)
Anesthesia QCDR form completed.        

## 2017-11-25 NOTE — Anesthesia Preprocedure Evaluation (Signed)
Anesthesia Evaluation  Patient identified by MRN, date of birth, ID band Patient awake    Reviewed: Allergy & Precautions, H&P , NPO status , Patient's Chart, lab work & pertinent test results, reviewed documented beta blocker date and time   History of Anesthesia Complications (+) PONV and history of anesthetic complications  Airway Mallampati: III   Neck ROM: full    Dental  (+) Poor Dentition, Teeth Intact   Pulmonary neg pulmonary ROS, sleep apnea ,    Pulmonary exam normal        Cardiovascular Exercise Tolerance: Good hypertension, On Medications negative cardio ROS Normal cardiovascular exam Rhythm:regular Rate:Normal     Neuro/Psych PSYCHIATRIC DISORDERS  Neuromuscular disease negative neurological ROS  negative psych ROS   GI/Hepatic negative GI ROS, Neg liver ROS, hiatal hernia, PUD, GERD  Medicated,  Endo/Other  negative endocrine ROSdiabetes, Well Controlled, Type 2, Oral Hypoglycemic AgentsHypothyroidism   Renal/GU negative Renal ROS  negative genitourinary   Musculoskeletal   Abdominal   Peds  Hematology negative hematology ROS (+)   Anesthesia Other Findings Past Medical History: No date: Anxiety No date: Arthritis     Comment:  bil knees No date: Bipolar disorder [facility]) Dec 2015: Colon polyp     Comment:  Dr Allen Norris No date: Complication of anesthesia No date: Depression     Comment:  dx 2004 No date: Diabetes mellitus without complication ([place])     Comment:  type ll  controlled by weight & diet No date: GERD (gastroesophageal reflux disease) No date: Gout No date: H/O hiatal hernia     Comment:  had surgery for that 2012  [facility] No date: Heartburn No date: Hypertension No date: Hypothyroidism No date: PONV (postoperative nausea and vomiting) No date: Sleep apnea     Comment:  dx 1.5 yr ago...study @ [facility], does not use CPAP Past Surgical History: No date: APPENDECTOMY Dec 2015:  COLONOSCOPY     Comment:  Dr Allen Norris 01/02/15: EXCISIONAL HEMORRHOIDECTOMY No date: FOOT SURGERY     Comment:  right foot 2012 No date: HERNIA REPAIR     Comment:  Left inguinal No date: HERNIA REPAIR     Comment:  hiatal hernia at [facility] 10/31/2017: [place]; Left     Comment:  Procedure: OPEN LEFT INGUINAL RECURRENT HERNIA REPAIR;                Surgeon: Johnathan Hausen, MD;  Location: [place];  Service: General;  Laterality: Left; No date: MANDIBLE SURGERY     Comment:  1983 No date: REPLACEMENT TOTAL KNEE BILATERAL     Comment:  patient had knee replacement to his right side in 2015               and right 2014, Revision of R knee 12/04/16 No date: ROTATOR CUFF REPAIR     Comment:  x 3  on right shoulder 03/04/2013: SHOULDER ARTHROSCOPY WITH ROTATOR CUFF REPAIR AND  SUBACROMIAL DECOMPRESSION; Left     Comment:  Procedure: LEFT SHOULDER ARTHROSCOPY WITH ROTATOR CUFF               REPAIR AND SUBACROMIAL DECOMPRESSION;  Surgeon: Marin Shutter, MD;  Location: [place];  Service: Orthopedics;  Laterality: Left; No date: SHOULDER SURGERY     Comment:  x 3 on left BMI    Body Mass Index:  28.70 kg/m     Reproductive/Obstetrics negative OB ROS                             Anesthesia Physical Anesthesia Plan  ASA: III  Anesthesia Plan: General   Post-op Pain Management:    Induction:   PONV Risk Score and Plan:   Airway Management Planned:   Additional Equipment:   Intra-op Plan:   Post-operative Plan:   Informed Consent: I have reviewed the patients History and Physical, chart, labs and discussed the procedure including the risks, benefits and alternatives for the proposed anesthesia with the patient or authorized representative who has indicated his/her understanding and acceptance.   Dental Advisory Given  Plan Discussed with: CRNA  Anesthesia Plan Comments:          Anesthesia Quick Evaluation

## 2017-11-25 NOTE — Op Note (Signed)
[facility] Gastroenterology Patient Name: Jeremiah [name] Procedure Date: 11/25/2017 8:57 AM MRN: [id number] Account #: [id number] Date of Birth: 11-05-1960 Admit Type: Outpatient Age: 57 Room: [facility] ENDO ROOM 4 Gender: Male Note Status: Finalized Procedure:            Colonoscopy Indications:          High risk colon cancer surveillance: Personal history                        of colonic polyps Providers:            Lucilla Lame MD, MD Referring MD:         Janine Ores. Rosanna Randy, MD (Referring MD) Medicines:            Propofol per Anesthesia Complications:        No immediate complications. Procedure:            Pre-Anesthesia Assessment:                       - Prior to the procedure, a History and Physical was                        performed, and patient medications and allergies were                        reviewed. The patient's tolerance of previous                        anesthesia was also reviewed. The risks and benefits of                        the procedure and the sedation options and risks were                        discussed with the patient. All questions were                        answered, and informed consent was obtained. Prior                        Anticoagulants: The patient has taken no previous                        anticoagulant or antiplatelet agents. ASA Grade                        Assessment: II - A patient with mild systemic disease.                        After reviewing the risks and benefits, the patient was                        deemed in satisfactory condition to undergo the                        procedure.                       After obtaining informed consent, the colonoscope was  passed under direct vision. Throughout the procedure,                        the patient's blood pressure, pulse, and oxygen                        saturations were monitored continuously. The                        Colonoscope  was introduced through the anus and                        advanced to the the cecum, identified by appendiceal                        orifice and ileocecal valve. The colonoscopy was                        performed without difficulty. The patient tolerated the                        procedure well. The quality of the bowel preparation                        was excellent. Findings:      The perianal and digital rectal examinations were normal.      A 4 mm polyp was found in the cecum. The polyp was sessile. The polyp       was removed with a cold snare. Resection and retrieval were complete.      A 2 mm polyp was found in the ascending colon. The polyp was sessile.       The polyp was removed with a cold biopsy forceps. Resection and       retrieval were complete.      A 3 mm polyp was found in the transverse colon. The polyp was sessile.       The polyp was removed with a cold biopsy forceps. Resection and       retrieval were complete.      Non-bleeding internal hemorrhoids were found during retroflexion. The       hemorrhoids were Grade II (internal hemorrhoids that prolapse but reduce       spontaneously).      A few small-mouthed diverticula were found in the sigmoid colon. Impression:           - One 4 mm polyp in the cecum, removed with a cold                        snare. Resected and retrieved.                       - One 2 mm polyp in the ascending colon, removed with a                        cold biopsy forceps. Resected and retrieved.                       - One 3 mm polyp in the transverse colon, removed with  a cold biopsy forceps. Resected and retrieved.                       - Non-bleeding internal hemorrhoids.                       - Diverticulosis in the sigmoid colon. Recommendation:       - Discharge patient to home.                       - Resume previous diet.                       - Continue present medications.                       -  Await pathology results.                       - Repeat colonoscopy in 5 years for surveillance. Procedure Code(s):    --- Professional ---                       [phone], Colonoscopy, flexible; with removal of tumor(s),                        polyp(s), or other lesion(s) by snare technique                       [id number], 28, Colonoscopy, flexible; with biopsy, single                        or multiple Diagnosis Code(s):    --- Professional ---                       Z86.010, Personal history of colonic polyps                       D12.0, Benign neoplasm of cecum                       D12.2, Benign neoplasm of ascending colon                       D12.3, Benign neoplasm of transverse colon (hepatic                        flexure or splenic flexure) CPT copyright 2016 American Medical Association. All rights reserved. The codes documented in this report are preliminary and upon coder review may  be revised to meet current compliance requirements. Lucilla Lame MD, MD 11/25/2017 9:20:20 AM This report has been signed electronically. Number of Addenda: 0 Note Initiated On: 11/25/2017 8:57 AM Scope Withdrawal Time: 0 hours 7 minutes 23 seconds  Total Procedure Duration: 0 hours 12 minutes 50 seconds       [facility]

## 2017-11-25 NOTE — H&P (Signed)
Jeremiah Lame, MD [address], [place] [address] Phone:[phone] Fax : [phone]  Primary Care Physician:  Jeremiah [name]., MD Primary Gastroenterologist:  Dr. Allen Norris  Pre-Procedure History & Physical: HPI:  Jeremiah [name] is a 57 y.o. male is here for an colonoscopy.   Past Medical History:  Diagnosis Date  . Anxiety   . Arthritis    bil knees  . Bipolar disorder ([place])   . Colon polyp Dec 2015   Dr Allen Norris  . Complication of anesthesia   . Depression    dx 2004  . Diabetes mellitus without complication ([place])    type ll  controlled by weight & diet  . GERD (gastroesophageal reflux disease)   . Gout   . H/O hiatal hernia    had surgery for that 2012  [facility]  . Heartburn   . Hypertension   . Hypothyroidism   . PONV (postoperative nausea and vomiting)   . Sleep apnea    dx 1.5 yr ago...study @ [facility], does not use CPAP    Past Surgical History:  Procedure Laterality Date  . APPENDECTOMY    . COLONOSCOPY  Dec 2015   Dr Allen Norris  . EXCISIONAL HEMORRHOIDECTOMY  01/02/15  . FOOT SURGERY     right foot 2012  . HERNIA REPAIR     Left inguinal  . HERNIA REPAIR     hiatal hernia at [place]  . INGUINAL HERNIA REPAIR Left 10/31/2017   Procedure: OPEN LEFT INGUINAL RECURRENT HERNIA REPAIR;  Surgeon: Johnathan Hausen, MD;  Location: [place];  Service: General;  Laterality: Left;  [name] MANDIBLE SURGERY     1983  . REPLACEMENT TOTAL KNEE BILATERAL     patient had knee replacement to his right side in 2015 and right 2014, Revision of R knee 12/04/16  . ROTATOR CUFF REPAIR     x 3  on right shoulder  . SHOULDER ARTHROSCOPY WITH ROTATOR CUFF REPAIR AND SUBACROMIAL DECOMPRESSION Left 03/04/2013   Procedure: LEFT SHOULDER ARTHROSCOPY WITH ROTATOR CUFF REPAIR AND SUBACROMIAL DECOMPRESSION;  Surgeon: Marin Shutter, MD;  Location: [place];  Service: Orthopedics;  Laterality: Left;  . SHOULDER SURGERY     x 3 on left    Prior to Admission  medications   Medication Sig Start Date End Date Taking? Authorizing Provider  allopurinol (ZYLOPRIM) 100 MG tablet TAKE 1 TABLET(100 MG) BY MOUTH DAILY 08/19/17  Yes Jeremiah [name]., MD  amLODipine (NORVASC) 10 MG tablet Take 5 mg by mouth daily.   Yes [provider]  buPROPion (WELLBUTRIN SR) 150 MG 12 hr tablet Take 1 tablet (150 mg total) daily by mouth. 08/18/17  Yes Rainey Pines, MD  EPIPEN 2-PAK 0.3 MG/0.3ML SOAJ injection  11/07/15  Yes [provider]  fluticasone (FLONASE) 50 MCG/ACT nasal spray Place 2 sprays into both nostrils daily. 06/23/17  Yes Carmon Ginsberg, PA  HYDROcodone-acetaminophen (NORCO) 10-325 MG tablet Take 1 tablet by mouth every 4 (four) hours as needed for severe pain. 10/31/17  Yes Johnathan Hausen, MD  levothyroxine (SYNTHROID, LEVOTHROID) 25 MCG tablet TAKE 1 TABLET(25 MCG) BY MOUTH DAILY BEFORE BREAKFAST 09/04/17  Yes Jeremiah [name]., MD  lithium carbonate (ESKALITH) 450 MG CR tablet Take 1 tablet (450 mg total) every morning by mouth. 08/18/17  Yes Rainey Pines, MD  loratadine (CLARITIN) 10 MG tablet Take 10 mg by mouth daily.   Yes [provider]  metFORMIN (GLUCOPHAGE) 500  MG tablet Take 1 tablet (500 mg total) by mouth 2 (two) times daily with a meal. 09/24/17  Yes Jeremiah [name]., MD  Multiple Vitamin (MULTIVITAMIN WITH MINERALS) TABS Take 1 tablet by mouth daily.   Yes [provider]  omeprazole (PRILOSEC) 20 MG capsule Take 20 mg by mouth daily.   Yes [provider]  testosterone cypionate (DEPOTESTOTERONE CYPIONATE) 100 MG/ML injection Inject into the muscle every 14 (fourteen) days. Pt gets at doctor office; dose unknown.   Yes [provider]  zolpidem (AMBIEN) 5 MG tablet Take 1 tablet (5 mg total) at bedtime as needed by mouth for sleep. 08/18/17  Yes Rainey Pines, MD    Allergies as of 10/20/2017 - Review Complete 09/23/2017  Allergen Reaction Noted  . Tequin [gatifloxacin]  Anaphylaxis and Rash 01/14/2013  . Amoxicillin Hives 02/23/2001    Family History  Problem Relation Age of Onset  . CAD Mother   . Arthritis Mother   . Angina Mother   . Diabetes Father   . Heart disease Father   . Colon polyps Father   . Heart attack Father   . Bipolar disorder Sister   . Asthma Sister   . Schizophrenia Sister   . Diabetes Maternal Grandmother   . Cancer Maternal Grandmother   . Heart attack Maternal Grandfather   . Stomach cancer Paternal Grandmother   . Kidney disease Neg Hx   . Prostate cancer Neg Hx     Social History   Socioeconomic History  . Marital status: Married    Spouse name: Not on file  . Number of children: Not on file  . Years of education: Not on file  . Highest education level: Not on file  Social Needs  . Financial resource strain: Not on file  . Food insecurity - worry: Not on file  . Food insecurity - inability: Not on file  . Transportation needs - medical: Not on file  . Transportation needs - non-medical: Not on file  Occupational History  . Not on file  Tobacco Use  . Smoking status: Never Smoker  . Smokeless tobacco: Former Systems developer    Types: Chew  Substance and Sexual Activity  . Alcohol use: No    Alcohol/week: 3.6 - 4.8 oz    Types: 6 - 8 Cans of beer per week    Frequency: Never  . Drug use: No  . Sexual activity: Yes  Other Topics Concern  . Not on file  Social History Narrative  . Not on file    Review of Systems: See HPI, otherwise negative ROS  Physical Exam: BP 124/89   Pulse 77   Temp (!) 96.4 F (35.8 C) (Tympanic)   Resp 16   Ht 5\' 10"  (1.778 m)   Wt 200 lb (90.7 kg)   SpO2 99%   BMI 28.70 kg/m  General:   Alert,  pleasant and cooperative in NAD Head:  Normocephalic and atraumatic. Neck:  Supple; no masses or thyromegaly. Lungs:  Clear throughout to auscultation.    Heart:  Regular rate and rhythm. Abdomen:  Soft, nontender and nondistended. Normal bowel sounds, without guarding, and  without rebound.   Neurologic:  Alert and  oriented x4;  grossly normal neurologically.  Impression/Plan: Jeremiah [name] is here for an colonoscopy to be performed for history of colon polyps  Risks, benefits, limitations, and alternatives regarding  colonoscopy have been reviewed with the patient.  Questions have been answered.  All parties agreeable.  Jeremiah Lame, MD  11/25/2017, 8:52 AM

## 2017-11-25 NOTE — Transfer of Care (Signed)
Immediate Anesthesia Transfer of Care Note  Patient: Jeremiah [name]  Procedure(s) Performed: COLONOSCOPY WITH PROPOFOL (N/A )  Patient Location: PACU and Endoscopy Unit  Anesthesia Type:General  Level of Consciousness: drowsy  Airway & Oxygen Therapy: Patient Spontanous Breathing and Patient connected to nasal cannula oxygen  Post-op Assessment: Report given to RN and Post -op Vital signs reviewed and stable  Post vital signs: Reviewed and stable  Last Vitals:  Vitals:   11/25/17 0753 11/25/17 0922  BP: 124/89 (!) 89/62  Pulse: 77 84  Resp: 16 14  Temp: (!) 35.8 C 37.1 C  SpO2: 99% 91%    Last Pain:  Vitals:   11/25/17 0922  TempSrc: Tympanic         Complications: No apparent anesthesia complications

## 2017-11-26 LAB — SURGICAL PATHOLOGY

## 2017-11-26 NOTE — Anesthesia Postprocedure Evaluation (Signed)
Anesthesia Post Note  Patient: Jeremiah [name]  Procedure(s) Performed: COLONOSCOPY WITH PROPOFOL (N/A )  Patient location during evaluation: PACU Anesthesia Type: General Level of consciousness: awake and alert Pain management: pain level controlled Vital Signs Assessment: post-procedure vital signs reviewed and stable Respiratory status: spontaneous breathing, nonlabored ventilation, respiratory function stable and patient connected to nasal cannula oxygen Cardiovascular status: blood pressure returned to baseline and stable Postop Assessment: no apparent nausea or vomiting Anesthetic complications: no     Last Vitals:  Vitals:   11/25/17 0942 11/25/17 0952  BP: 103/78 108/84  Pulse: 83 77  Resp: 19 20  Temp:    SpO2: 94% 99%    Last Pain:  Vitals:   11/25/17 0922  TempSrc: Tympanic                 Molli Barrows

## 2017-11-27 ENCOUNTER — Other Ambulatory Visit: Payer: Self-pay | Admitting: Family Medicine

## 2017-11-27 ENCOUNTER — Other Ambulatory Visit: Payer: Self-pay | Admitting: Psychiatry

## 2017-11-28 DIAGNOSIS — J3089 Other allergic rhinitis: Secondary | ICD-10-CM | POA: Diagnosis not present

## 2017-11-28 DIAGNOSIS — J301 Allergic rhinitis due to pollen: Secondary | ICD-10-CM | POA: Diagnosis not present

## 2017-12-01 ENCOUNTER — Encounter: Payer: Self-pay | Admitting: Gastroenterology

## 2017-12-01 NOTE — Telephone Encounter (Signed)
refilled 

## 2017-12-02 DIAGNOSIS — J301 Allergic rhinitis due to pollen: Secondary | ICD-10-CM | POA: Diagnosis not present

## 2017-12-03 DIAGNOSIS — Z96651 Presence of right artificial knee joint: Secondary | ICD-10-CM | POA: Diagnosis not present

## 2017-12-03 DIAGNOSIS — M6281 Muscle weakness (generalized): Secondary | ICD-10-CM | POA: Diagnosis not present

## 2017-12-03 DIAGNOSIS — M25561 Pain in right knee: Secondary | ICD-10-CM | POA: Diagnosis not present

## 2017-12-04 DIAGNOSIS — J3089 Other allergic rhinitis: Secondary | ICD-10-CM | POA: Diagnosis not present

## 2017-12-04 DIAGNOSIS — J301 Allergic rhinitis due to pollen: Secondary | ICD-10-CM | POA: Diagnosis not present

## 2017-12-09 DIAGNOSIS — M5136 Other intervertebral disc degeneration, lumbar region: Secondary | ICD-10-CM | POA: Insufficient documentation

## 2017-12-11 DIAGNOSIS — J301 Allergic rhinitis due to pollen: Secondary | ICD-10-CM | POA: Diagnosis not present

## 2017-12-11 DIAGNOSIS — J3089 Other allergic rhinitis: Secondary | ICD-10-CM | POA: Diagnosis not present

## 2017-12-17 DIAGNOSIS — M25561 Pain in right knee: Secondary | ICD-10-CM | POA: Diagnosis not present

## 2017-12-17 DIAGNOSIS — Z96651 Presence of right artificial knee joint: Secondary | ICD-10-CM | POA: Diagnosis not present

## 2017-12-18 DIAGNOSIS — J3089 Other allergic rhinitis: Secondary | ICD-10-CM | POA: Diagnosis not present

## 2017-12-18 DIAGNOSIS — J301 Allergic rhinitis due to pollen: Secondary | ICD-10-CM | POA: Diagnosis not present

## 2017-12-22 ENCOUNTER — Ambulatory Visit: Payer: 59 | Admitting: Psychiatry

## 2017-12-23 DIAGNOSIS — J301 Allergic rhinitis due to pollen: Secondary | ICD-10-CM | POA: Diagnosis not present

## 2017-12-23 DIAGNOSIS — J3089 Other allergic rhinitis: Secondary | ICD-10-CM | POA: Diagnosis not present

## 2017-12-25 DIAGNOSIS — R0981 Nasal congestion: Secondary | ICD-10-CM | POA: Diagnosis not present

## 2017-12-25 DIAGNOSIS — R05 Cough: Secondary | ICD-10-CM | POA: Diagnosis not present

## 2017-12-25 DIAGNOSIS — M545 Low back pain: Secondary | ICD-10-CM | POA: Diagnosis not present

## 2017-12-29 ENCOUNTER — Ambulatory Visit: Payer: Self-pay | Admitting: Family Medicine

## 2017-12-29 ENCOUNTER — Ambulatory Visit: Payer: 59 | Admitting: Psychiatry

## 2017-12-29 DIAGNOSIS — I10 Essential (primary) hypertension: Secondary | ICD-10-CM | POA: Diagnosis not present

## 2017-12-30 DIAGNOSIS — J3089 Other allergic rhinitis: Secondary | ICD-10-CM | POA: Diagnosis not present

## 2017-12-30 DIAGNOSIS — J301 Allergic rhinitis due to pollen: Secondary | ICD-10-CM | POA: Diagnosis not present

## 2018-01-08 DIAGNOSIS — J301 Allergic rhinitis due to pollen: Secondary | ICD-10-CM | POA: Diagnosis not present

## 2018-01-08 DIAGNOSIS — J3089 Other allergic rhinitis: Secondary | ICD-10-CM | POA: Diagnosis not present

## 2018-01-12 ENCOUNTER — Ambulatory Visit: Payer: 59 | Admitting: Psychiatry

## 2018-01-12 ENCOUNTER — Other Ambulatory Visit: Payer: Self-pay

## 2018-01-12 ENCOUNTER — Encounter: Payer: Self-pay | Admitting: Psychiatry

## 2018-01-12 VITALS — BP 149/101 | HR 98 | Temp 99.0°F | Wt 196.8 lb

## 2018-01-12 DIAGNOSIS — F319 Bipolar disorder, unspecified: Secondary | ICD-10-CM | POA: Diagnosis not present

## 2018-01-12 MED ORDER — BUPROPION HCL ER (XL) 150 MG PO TB24: 150.0000 mg | ORAL_TABLET | Freq: Every day | ORAL | 1 refills | Status: DC

## 2018-01-12 MED ORDER — ZOLPIDEM TARTRATE 5 MG PO TABS: 5.0000 mg | ORAL_TABLET | Freq: Every evening | ORAL | 2 refills | Status: DC | PRN

## 2018-01-12 MED ORDER — LITHIUM CARBONATE ER 300 MG PO TBCR: 300.0000 mg | EXTENDED_RELEASE_TABLET | Freq: Every morning | ORAL | 1 refills | Status: DC

## 2018-01-12 NOTE — Progress Notes (Signed)
Psychiatric MD Progress Note   Patient Identification: Jeremiah [name] MRN:  [id number] Date of Evaluation:  01/12/2018 Referral Source: Dr. Bridgett Larsson Chief Complaint:   Chief Complaint    Follow-up; Medication Refill     Visit Diagnosis:    ICD-9-CM ICD-10-CM   1. Bipolar I disorder, most recent episode depressed ([place]) 296.50 F31.30   2. Alcohol dependence   303.93 F10.21    Diagnosis:   Patient Active Problem List   Diagnosis Date Noted  . Benign neoplasm of cecum [D12.0]   . Left inguinal hernia [K40.90] 09/08/2017  . Right inguinal hernia [K40.90] 09/08/2017  . Umbilical hernia without obstruction and without gangrene [K42.9] 09/08/2017  . Allergic rhinitis due to pollen [J30.1] 06/23/2017  . Chest pain [R07.9] 03/05/2016  . Pedal edema [R60.0] 03/08/2015  . History of repair of rotator cuff [Z98.890] 02/17/2014  . Diabetes ([place]) [E11.9] 08/31/2013  . BP (high blood pressure) [I10] 08/31/2013  . Adult hypothyroidism [E03.9] 08/31/2013  . Obstructive apnea [G47.33] 08/31/2013  . Clinical depression [F32.9] 08/31/2013  . Diabetes mellitus ([place]) [E11.9] 08/31/2013  . Idiopathic localized osteoarthropathy [M19.91] 05/31/2013  . ABDOMINAL PAIN, LEFT UPPER QUADRANT [R10.12] 10/04/2009  . TARSAL TUNNEL SYNDROME, RIGHT [G57.50] 09/25/2009  . ANKLE PAIN, RIGHT [M25.579] 09/25/2009  . OTHER ANKLE SPRAIN AND STRAIN [P53.614E, S96.819A] 09/25/2009  . BREAST MASS, RIGHT [N63.0] 01/07/2008  . INSOMNIA [G47.00] 01/07/2008  . GASTRIC ULCER, ACUTE [K25.3] 11/09/2007  . DUODENITIS [K29.80] 11/09/2007  . BLOOD IN STOOL, OCCULT [R19.5] 11/09/2007  . PERSONAL HISTORY OF COLONIC POLYPS [Z86.010] 11/09/2007  . BACK PAIN, LUMBAR [M54.5] 10/12/2007  . HELICOBACTER PYLORI GASTRITIS [A04.8] 09/09/2007  . GASTRIC ULCER, ACUTE, HEMORRHAGE [K25.0] 09/09/2007  . Hiatal hernia [K44.9] 09/09/2007  . COLONIC POLYPS, ADENOMATOUS [D12.6] 08/19/2007  . INTERNAL HEMORRHOIDS [K64.8] 08/19/2007  . EXTERNAL  HEMORRHOIDS WITHOUT MENTION COMP [K64.4] 08/07/2007  . Blood in stool [K92.1] 08/07/2007  . ABDOMINAL PAIN, LEFT LOWER QUADRANT [R10.32] 08/07/2007  . SINUSITIS, ACUTE [J01.90] 06/12/2007  . ABDOMINAL PAIN [R10.9] 06/12/2007  . DIABETES MELLITUS, TYPE II [E11.9] 03/19/2007  . GOUT [M10.9] 03/19/2007  . DISORDER, CIRCADIAN RHYTHM SLEEP, NONORGANIC [G47.20, F51.8] 03/19/2007  . Depression [F32.9] 03/19/2007  . OSTEOARTHROSIS, GENERALIZED, MULTIPLE SITES [M15.9] 03/19/2007   History of Present Illness:   Patient is a 57 -year-old married male who presented for Follow-up. He reported that he has been doing well and has been compliant with his medications.  He reported that he has been feeling better since the weather has changed and he enjoys the summer season.  He stated that he wants to have his medications adjusted as he has been taking lithium for the past 6 years and has never been admitted to the psychiatric facility.  We discussed about adjusting the dose of his medication and he agreed with the plan.  He reported that he worked long hours on a daily basis.  He currently denied having any suicidal or homicidal ideations or plans.  He reported that he feels well on his current combination of medication and does not feel depressed hopeless or helpless.  He denied having any mood swings.  He was happy and laughing inappropriately during the interview.    He is sleeping well at night and takes Ambien on a as needed basis.       Past Medical History:  Past Medical History:  Diagnosis Date  . Anxiety   . Arthritis    bil knees  . Bipolar disorder ([place])   . Colon  polyp Dec 2015   Dr Allen Norris  . Complication of anesthesia   . Depression    dx 2004  . Diabetes mellitus without complication ([place])    type ll  controlled by weight & diet  . GERD (gastroesophageal reflux disease)   . Gout   . H/O hiatal hernia    had surgery for that 2012  [facility]  . Heartburn   . Hypertension   .  Hypothyroidism   . PONV (postoperative nausea and vomiting)   . Sleep apnea    dx 1.5 yr ago...study @ [facility], does not use CPAP    Past Surgical History:  Procedure Laterality Date  . APPENDECTOMY    . COLONOSCOPY  Dec 2015   Dr Allen Norris  . COLONOSCOPY WITH PROPOFOL N/A 11/25/2017   Procedure: COLONOSCOPY WITH PROPOFOL;  Surgeon: Lucilla Lame, MD;  Location: [facility] ENDOSCOPY;  Service: Endoscopy;  Laterality: N/A;  . EXCISIONAL HEMORRHOIDECTOMY  01/02/15  . FOOT SURGERY     right foot 2012  . HERNIA REPAIR     Left inguinal  . HERNIA REPAIR     hiatal hernia at [place]  . INGUINAL HERNIA REPAIR Left 10/31/2017   Procedure: OPEN LEFT INGUINAL RECURRENT HERNIA REPAIR;  Surgeon: Johnathan Hausen, MD;  Location: [place];  Service: General;  Laterality: Left;  [name] MANDIBLE SURGERY     1983  . REPLACEMENT TOTAL KNEE BILATERAL     patient had knee replacement to his right side in 2015 and right 2014, Revision of R knee 12/04/16  . ROTATOR CUFF REPAIR     x 3  on right shoulder  . SHOULDER ARTHROSCOPY WITH ROTATOR CUFF REPAIR AND SUBACROMIAL DECOMPRESSION Left 03/04/2013   Procedure: LEFT SHOULDER ARTHROSCOPY WITH ROTATOR CUFF REPAIR AND SUBACROMIAL DECOMPRESSION;  Surgeon: Marin Shutter, MD;  Location: [place];  Service: Orthopedics;  Laterality: Left;  . SHOULDER SURGERY     x 3 on left   Family History:  Family History  Problem Relation Age of Onset  . CAD Mother   . Arthritis Mother   . Angina Mother   . Diabetes Father   . Heart disease Father   . Colon polyps Father   . Heart attack Father   . Bipolar disorder Sister   . Asthma Sister   . Schizophrenia Sister   . Diabetes Maternal Grandmother   . Cancer Maternal Grandmother   . Heart attack Maternal Grandfather   . Stomach cancer Paternal Grandmother   . Kidney disease Neg Hx   . Prostate cancer Neg Hx    Social History:   Social History   Socioeconomic History  . Marital status: Married    Spouse name: Not on  file  . Number of children: Not on file  . Years of education: Not on file  . Highest education level: Not on file  Occupational History  . Not on file  Social Needs  . Financial resource strain: Not on file  . Food insecurity:    Worry: Not on file    Inability: Not on file  . Transportation needs:    Medical: Not on file    Non-medical: Not on file  Tobacco Use  . Smoking status: Never Smoker  . Smokeless tobacco: Former Systems developer    Types: Chew  Substance and Sexual Activity  . Alcohol use: No    Alcohol/week: 3.6 - 4.8 oz    Types: 6 - 8 Cans of beer per week  Frequency: Never  . Drug use: No  . Sexual activity: Yes  Lifestyle  . Physical activity:    Days per week: Not on file    Minutes per session: Not on file  . Stress: Not on file  Relationships  . Social connections:    Talks on phone: Not on file    Gets together: Not on file    Attends religious service: Not on file    Active member of club or organization: Not on file    Attends meetings of clubs or organizations: Not on file    Relationship status: Not on file  Other Topics Concern  . Not on file  Social History Narrative  . Not on file   Additional Social History:  Married x 4 years. This is his second marrigae. Works at [employer] in [employer] division.  Both have children from the first marriages. Patient drinks alcohol on a regular basis. He reported that this is lite alcohol and he does not feel that it is affecting his medication.  Musculoskeletal: Strength & Muscle Tone: within normal limits Gait & Station: normal Patient leans: N/A  Psychiatric Specialty Exam: Medication Refill   Anxiety  Symptoms include insomnia.    Insomnia     Review of Systems  Psychiatric/Behavioral: The patient has insomnia.     Blood pressure (!) 149/101, pulse 98, temperature 99 F (37.2 C), temperature source Oral, weight 196 lb 12.8 oz (89.3 kg).Body mass index is 28.24 kg/m.  General Appearance: Casual  Eye  Contact:  Fair  Speech:  Clear and Coherent and Normal Rate  Volume:  Normal  Mood:  Anxious  Affect:  Congruent  Thought Process:  Coherent and Goal Directed  Orientation:  Full (Time, Place, and Person)  Thought Content:  WDL  Suicidal Thoughts:  No  Homicidal Thoughts:  No  Memory:  Immediate;   Fair  Judgement:  Fair  Insight:  Fair  Psychomotor Activity:  Normal  Concentration:  Fair  Recall:  [employer] [place]  Language: Fair  Akathisia:  No  Handed:  Right  AIMS (if indicated):    Assets:  Communication Skills Desire for Improvement Housing Intimacy Physical Health Social Support  ADL's:  Intact  Cognition: WNL  Sleep:  7-8   Is the patient at risk to self?  No. Has the patient been a risk to self in the past 6 months?  No. Has the patient been a risk to self within the distant past?  No. Is the patient a risk to others?  No. Has the patient been a risk to others in the past 6 months?  No. Has the patient been a risk to others within the distant past?  No.  Allergies:   Allergies  Allergen Reactions  . Tequin [Gatifloxacin] Anaphylaxis and Rash  . Amoxicillin Hives   Current Medications: Current Outpatient Medications  Medication Sig Dispense Refill  . allopurinol (ZYLOPRIM) 100 MG tablet TAKE 1 TABLET(100 MG) BY MOUTH DAILY 30 tablet 11  . amLODipine (NORVASC) 10 MG tablet Take 5 mg by mouth daily.    [name] buPROPion (WELLBUTRIN XL) 300 MG 24 hr tablet Take by mouth.    . EPIPEN 2-PAK 0.3 MG/0.3ML SOAJ injection     . fluticasone (FLONASE) 50 MCG/ACT nasal spray Place 2 sprays into both nostrils daily. 16 g 1  . HYDROcodone-acetaminophen (NORCO) 10-325 MG tablet Take 1 tablet by mouth every 4 (four) hours as needed for severe pain. 20 tablet 0  .  hydrOXYzine (ATARAX/VISTARIL) 10 MG tablet hydroxyzine HCl 10 mg tablet    . levothyroxine (SYNTHROID, LEVOTHROID) 25 MCG tablet TAKE 1 TABLET BY MOUTH DAILY BEFORE BREAKFAST 90 tablet 0  . lithium  carbonate (ESKALITH) 450 MG CR tablet TAKE 1 TABLET(450 MG) BY MOUTH EVERY MORNING 30 tablet 0  . loratadine (CLARITIN) 10 MG tablet Take 10 mg by mouth daily.    . metFORMIN (GLUCOPHAGE) 500 MG tablet Take 1 tablet (500 mg total) by mouth 2 (two) times daily with a meal. 180 tablet 3  . montelukast (SINGULAIR) 10 MG tablet TK 1 T PO QPM  2  . Multiple Vitamin (MULTIVITAMIN WITH MINERALS) TABS Take 1 tablet by mouth daily.    [name] omeprazole (PRILOSEC) 20 MG capsule Take 20 mg by mouth daily.    [name] testosterone cypionate (DEPOTESTOTERONE CYPIONATE) 100 MG/ML injection Inject into the muscle every 14 (fourteen) days. Pt gets at doctor office; dose unknown.    . zolpidem (AMBIEN) 5 MG tablet Take 1 tablet (5 mg total) at bedtime as needed by mouth for sleep. 30 tablet 2   No current facility-administered medications for this visit.     Previous Psychotropic Medications: Lithium Wellbutrin Abilify prozac zoloft celexa Depakote seroquel- could not function risperdal pristiq Lamotrigine Latuda belsorma Trazodone   Substance Abuse History in the last 12 months:  Yes.    Consequences of Substance Abuse: Negative NA  Medical Decision Making:  Review of Psycho-Social Stressors (1) and Review and summation of old records (2)  Treatment Plan Summary: Medication management     Discussed with patient at length about his medications treatment risks benefits and alternatives. Continue medications as prescribed   Continue  Wellbutrin XL 150 mg in the morning.  I will change lithium 300 mg p.o. nightly. We will be given a prescription of Ambien 5 mg 10 pills on a as needed basis  Follow-up in 1 month or earlier    More than 50% of the time spent in psychoeducation, counseling and coordination of care.     This note was generated in part or whole with voice recognition software. Voice regonition is usually quite accurate but there are transcription errors that can and very often do  occur. I apologize for any typographical errors that were not detected and corrected.    Rainey Pines, MD    4/8/20193:13 PM

## 2018-01-13 DIAGNOSIS — J3089 Other allergic rhinitis: Secondary | ICD-10-CM | POA: Diagnosis not present

## 2018-01-13 DIAGNOSIS — J301 Allergic rhinitis due to pollen: Secondary | ICD-10-CM | POA: Diagnosis not present

## 2018-01-19 DIAGNOSIS — J3089 Other allergic rhinitis: Secondary | ICD-10-CM | POA: Diagnosis not present

## 2018-01-20 DIAGNOSIS — J301 Allergic rhinitis due to pollen: Secondary | ICD-10-CM | POA: Diagnosis not present

## 2018-01-20 DIAGNOSIS — J3089 Other allergic rhinitis: Secondary | ICD-10-CM | POA: Diagnosis not present

## 2018-01-28 ENCOUNTER — Ambulatory Visit (INDEPENDENT_AMBULATORY_CARE_PROVIDER_SITE_OTHER): Payer: 59

## 2018-01-28 DIAGNOSIS — E291 Testicular hypofunction: Secondary | ICD-10-CM | POA: Diagnosis not present

## 2018-01-28 MED ORDER — TESTOSTERONE UNDECANOATE 750 MG/3ML IM SOLN
Freq: Once | INTRAMUSCULAR | Status: AC
  Administered 2018-01-28: 15:00:00 via INTRAMUSCULAR

## 2018-01-28 NOTE — Progress Notes (Signed)
Aveed Injection  Due to Hypogonadism patient is present today for a Aveed Injection.  Medication: Aveed Dose: 750mg / 53ml MGQ:[id number] Exp:05/2021 Location: right upper outer buttocks Injection of medication was given over a 60 second period of time.  Patient tolerated well, no complications were noted Patient remained in the office for 30 minutes post injection for monitoring and will report any side effects.  Preformed by: Toniann Fail, LPN   Additional notes/ Follow up: 10 weeks

## 2018-01-29 DIAGNOSIS — J3089 Other allergic rhinitis: Secondary | ICD-10-CM | POA: Diagnosis not present

## 2018-01-29 DIAGNOSIS — J301 Allergic rhinitis due to pollen: Secondary | ICD-10-CM | POA: Diagnosis not present

## 2018-02-05 ENCOUNTER — Other Ambulatory Visit: Payer: Self-pay | Admitting: Psychiatry

## 2018-02-05 DIAGNOSIS — J3089 Other allergic rhinitis: Secondary | ICD-10-CM | POA: Diagnosis not present

## 2018-02-05 DIAGNOSIS — J301 Allergic rhinitis due to pollen: Secondary | ICD-10-CM | POA: Diagnosis not present

## 2018-02-09 ENCOUNTER — Ambulatory Visit: Payer: 59 | Admitting: Psychiatry

## 2018-02-10 DIAGNOSIS — J301 Allergic rhinitis due to pollen: Secondary | ICD-10-CM | POA: Diagnosis not present

## 2018-02-10 DIAGNOSIS — J3089 Other allergic rhinitis: Secondary | ICD-10-CM | POA: Diagnosis not present

## 2018-02-13 DIAGNOSIS — J301 Allergic rhinitis due to pollen: Secondary | ICD-10-CM | POA: Diagnosis not present

## 2018-02-13 DIAGNOSIS — J3089 Other allergic rhinitis: Secondary | ICD-10-CM | POA: Diagnosis not present

## 2018-02-17 DIAGNOSIS — J3089 Other allergic rhinitis: Secondary | ICD-10-CM | POA: Diagnosis not present

## 2018-02-17 DIAGNOSIS — J301 Allergic rhinitis due to pollen: Secondary | ICD-10-CM | POA: Diagnosis not present

## 2018-02-19 ENCOUNTER — Ambulatory Visit: Payer: Self-pay | Admitting: Psychiatry

## 2018-02-20 DIAGNOSIS — J301 Allergic rhinitis due to pollen: Secondary | ICD-10-CM | POA: Diagnosis not present

## 2018-02-20 DIAGNOSIS — J3089 Other allergic rhinitis: Secondary | ICD-10-CM | POA: Diagnosis not present

## 2018-02-26 ENCOUNTER — Other Ambulatory Visit: Payer: Self-pay | Admitting: Family Medicine

## 2018-02-27 DIAGNOSIS — J301 Allergic rhinitis due to pollen: Secondary | ICD-10-CM | POA: Diagnosis not present

## 2018-02-27 DIAGNOSIS — J3089 Other allergic rhinitis: Secondary | ICD-10-CM | POA: Diagnosis not present

## 2018-03-04 ENCOUNTER — Other Ambulatory Visit: Payer: 59

## 2018-03-04 ENCOUNTER — Other Ambulatory Visit: Payer: Self-pay | Admitting: Family Medicine

## 2018-03-04 DIAGNOSIS — E291 Testicular hypofunction: Secondary | ICD-10-CM

## 2018-03-05 LAB — TESTOSTERONE: Testosterone: 327 ng/dL (ref 264–916)

## 2018-03-06 ENCOUNTER — Other Ambulatory Visit: Payer: Self-pay | Admitting: Psychiatry

## 2018-03-06 ENCOUNTER — Telehealth: Payer: Self-pay

## 2018-03-06 DIAGNOSIS — M9901 Segmental and somatic dysfunction of cervical region: Secondary | ICD-10-CM | POA: Diagnosis not present

## 2018-03-06 DIAGNOSIS — E291 Testicular hypofunction: Secondary | ICD-10-CM

## 2018-03-06 DIAGNOSIS — J301 Allergic rhinitis due to pollen: Secondary | ICD-10-CM | POA: Diagnosis not present

## 2018-03-06 DIAGNOSIS — J3089 Other allergic rhinitis: Secondary | ICD-10-CM | POA: Diagnosis not present

## 2018-03-06 DIAGNOSIS — N138 Other obstructive and reflux uropathy: Secondary | ICD-10-CM

## 2018-03-06 DIAGNOSIS — M5430 Sciatica, unspecified side: Secondary | ICD-10-CM | POA: Diagnosis not present

## 2018-03-06 DIAGNOSIS — N401 Enlarged prostate with lower urinary tract symptoms: Secondary | ICD-10-CM

## 2018-03-06 DIAGNOSIS — M9905 Segmental and somatic dysfunction of pelvic region: Secondary | ICD-10-CM | POA: Diagnosis not present

## 2018-03-06 NOTE — Telephone Encounter (Signed)
lmom for pt to call office

## 2018-03-06 NOTE — Telephone Encounter (Signed)
-----   Message from Nori Riis, PA-C sent at 03/05/2018  8:22 AM EDT ----- Please let Jeremiah [name] know that his PSA has not reached therapeutic levels with the AVEED.  He needs an office visit for an exam and PSA, HCT and HBG prior to the appointment at this time.  We can discuss alternatives at that appointment.

## 2018-03-09 ENCOUNTER — Ambulatory Visit: Payer: 59 | Admitting: Psychiatry

## 2018-03-09 ENCOUNTER — Other Ambulatory Visit: Payer: Self-pay

## 2018-03-09 ENCOUNTER — Encounter: Payer: Self-pay | Admitting: Psychiatry

## 2018-03-09 VITALS — BP 138/85 | HR 86 | Temp 97.4°F | Wt 207.4 lb

## 2018-03-09 DIAGNOSIS — F319 Bipolar disorder, unspecified: Secondary | ICD-10-CM | POA: Diagnosis not present

## 2018-03-09 MED ORDER — LITHIUM CARBONATE ER 300 MG PO TBCR: 300.0000 mg | EXTENDED_RELEASE_TABLET | Freq: Every morning | ORAL | 1 refills | Status: DC

## 2018-03-09 MED ORDER — BUPROPION HCL ER (XL) 150 MG PO TB24: 150.0000 mg | ORAL_TABLET | Freq: Every day | ORAL | 1 refills | Status: DC

## 2018-03-09 MED ORDER — ZOLPIDEM TARTRATE 10 MG PO TABS: 10.0000 mg | ORAL_TABLET | Freq: Every evening | ORAL | 1 refills | Status: DC | PRN

## 2018-03-09 NOTE — Progress Notes (Signed)
Psychiatric MD Progress Note   Patient Identification: Jeremiah [name] MRN:  [id number] Date of Evaluation:  03/09/2018 Referral Source: Dr. Bridgett Larsson Chief Complaint:   Chief Complaint    Follow-up; Medication Refill     Visit Diagnosis:    ICD-9-CM ICD-10-CM   1. Bipolar I disorder, most recent episode depressed ([place]) 296.50 F31.30   2.       Diagnosis:   Patient Active Problem List   Diagnosis Date Noted  . Benign neoplasm of cecum [D12.0]   . Left inguinal hernia [K40.90] 09/08/2017  . Right inguinal hernia [K40.90] 09/08/2017  . Umbilical hernia without obstruction and without gangrene [K42.9] 09/08/2017  . Allergic rhinitis due to pollen [J30.1] 06/23/2017  . Chest pain [R07.9] 03/05/2016  . Pedal edema [R60.0] 03/08/2015  . History of repair of rotator cuff [Z98.890] 02/17/2014  . Diabetes ([place]) [E11.9] 08/31/2013  . BP (high blood pressure) [I10] 08/31/2013  . Adult hypothyroidism [E03.9] 08/31/2013  . Obstructive apnea [G47.33] 08/31/2013  . Clinical depression [F32.9] 08/31/2013  . Diabetes mellitus ([place]) [E11.9] 08/31/2013  . Idiopathic localized osteoarthropathy [M19.91] 05/31/2013  . ABDOMINAL PAIN, LEFT UPPER QUADRANT [R10.12] 10/04/2009  . TARSAL TUNNEL SYNDROME, RIGHT [G57.50] 09/25/2009  . ANKLE PAIN, RIGHT [M25.579] 09/25/2009  . OTHER ANKLE SPRAIN AND STRAIN [P61.950D, S96.819A] 09/25/2009  . BREAST MASS, RIGHT [N63.0] 01/07/2008  . INSOMNIA [G47.00] 01/07/2008  . GASTRIC ULCER, ACUTE [K25.3] 11/09/2007  . DUODENITIS [K29.80] 11/09/2007  . BLOOD IN STOOL, OCCULT [R19.5] 11/09/2007  . PERSONAL HISTORY OF COLONIC POLYPS [Z86.010] 11/09/2007  . BACK PAIN, LUMBAR [M54.5] 10/12/2007  . HELICOBACTER PYLORI GASTRITIS [A04.8] 09/09/2007  . GASTRIC ULCER, ACUTE, HEMORRHAGE [K25.0] 09/09/2007  . Hiatal hernia [K44.9] 09/09/2007  . COLONIC POLYPS, ADENOMATOUS [D12.6] 08/19/2007  . INTERNAL HEMORRHOIDS [K64.8] 08/19/2007  . EXTERNAL HEMORRHOIDS WITHOUT MENTION COMP  [K64.4] 08/07/2007  . Blood in stool [K92.1] 08/07/2007  . ABDOMINAL PAIN, LEFT LOWER QUADRANT [R10.32] 08/07/2007  . SINUSITIS, ACUTE [J01.90] 06/12/2007  . ABDOMINAL PAIN [R10.9] 06/12/2007  . DIABETES MELLITUS, TYPE II [E11.9] 03/19/2007  . GOUT [M10.9] 03/19/2007  . DISORDER, CIRCADIAN RHYTHM SLEEP, NONORGANIC [G47.20, F51.8] 03/19/2007  . Depression [F32.9] 03/19/2007  . OSTEOARTHROSIS, GENERALIZED, MULTIPLE SITES [M15.9] 03/19/2007   History of Present Illness:   Patient is a 57 -year-old married male who presented for Follow-up. He reported that he has been doing well and has been compliant with his medications.  He has noticed some improvement in his symptoms since the medications have been adjusted.  He reported that he was having issues with his wife as she has opened up some credit cards to get help from the consolidation companies.  He is trying to pay the loans.  He was discussing that in detail.  He reported that he has to start paying the loans with the help of his wife and it will take them at least 4 years.  He stated that she is supportive.  Patient reported that he has been sleeping well at night.  He reported that he feels that the lithium has been helpful and his mood swings are getting better.  He denied having any suicidal or homicidal ideations or plans.  He does not want to adjust her medications at this time.  He appeared more calm and alert during the interview.    Denied having any perceptual disturbances.  He denied having any thoughts to hurt anybody else at this time.       He is sleeping well at night and  takes Ambien on a as needed basis.       Past Medical History:  Past Medical History:  Diagnosis Date  . Anxiety   . Arthritis    bil knees  . Bipolar disorder ([place])   . Colon polyp Dec 2015   Dr Allen Norris  . Complication of anesthesia   . Depression    dx 2004  . Diabetes mellitus without complication ([place])    type ll  controlled by weight & diet  .  GERD (gastroesophageal reflux disease)   . Gout   . H/O hiatal hernia    had surgery for that 2012  [facility]  . Heartburn   . Hypertension   . Hypothyroidism   . PONV (postoperative nausea and vomiting)   . Sleep apnea    dx 1.5 yr ago...study @ [facility], does not use CPAP    Past Surgical History:  Procedure Laterality Date  . APPENDECTOMY    . COLONOSCOPY  Dec 2015   Dr Allen Norris  . COLONOSCOPY WITH PROPOFOL N/A 11/25/2017   Procedure: COLONOSCOPY WITH PROPOFOL;  Surgeon: Lucilla Lame, MD;  Location[facility] ENDOSCOPY;  Service: Endoscopy;  Laterality: N/A;  . EXCISIONAL HEMORRHOIDECTOMY  01/02/15  . FOOT SURGERY     right foot 2012  . HERNIA REPAIR     Left inguinal  . HERNIA REPAIR     hiatal hernia at [place]  . INGUINAL HERNIA REPAIR Left 10/31/2017   Procedure: OPEN LEFT INGUINAL RECURRENT HERNIA REPAIR;  Surgeon: Johnathan Hausen, MD;  Location: [facility];  Service: General;  Laterality: Left;  [name] MANDIBLE SURGERY     1983  . REPLACEMENT TOTAL KNEE BILATERAL     patient had knee replacement to his right side in 2015 and right 2014, Revision of R knee 12/04/16  . ROTATOR CUFF REPAIR     x 3  on right shoulder  . SHOULDER ARTHROSCOPY WITH ROTATOR CUFF REPAIR AND SUBACROMIAL DECOMPRESSION Left 03/04/2013   Procedure: LEFT SHOULDER ARTHROSCOPY WITH ROTATOR CUFF REPAIR AND SUBACROMIAL DECOMPRESSION;  Surgeon: Marin Shutter, MD;  Location: [facility];  Service: Orthopedics;  Laterality: Left;  . SHOULDER SURGERY     x 3 on left   Family History:  Family History  Problem Relation Age of Onset  . CAD Mother   . Arthritis Mother   . Angina Mother   . Diabetes Father   . Heart disease Father   . Colon polyps Father   . Heart attack Father   . Bipolar disorder Sister   . Asthma Sister   . Schizophrenia Sister   . Diabetes Maternal Grandmother   . Cancer Maternal Grandmother   . Heart attack Maternal Grandfather   . Stomach cancer Paternal Grandmother   . Kidney disease  Neg Hx   . Prostate cancer Neg Hx    Social History:   Social History   Socioeconomic History  . Marital status: Married    Spouse name: Not on file  . Number of children: Not on file  . Years of education: Not on file  . Highest education level: Not on file  Occupational History  . Not on file  Social Needs  . Financial resource strain: Not on file  . Food insecurity:    Worry: Not on file    Inability: Not on file  . Transportation needs:    Medical: Not on file    Non-medical: Not on file  Tobacco Use  . Smoking status: Never  Smoker  . Smokeless tobacco: Former Systems developer    Types: Chew  Substance and Sexual Activity  . Alcohol use: No    Alcohol/week: 3.6 - 4.8 oz    Types: 6 - 8 Cans of beer per week    Frequency: Never  . Drug use: No  . Sexual activity: Yes  Lifestyle  . Physical activity:    Days per week: Not on file    Minutes per session: Not on file  . Stress: Not on file  Relationships  . Social connections:    Talks on phone: Not on file    Gets together: Not on file    Attends religious service: Not on file    Active member of club or organization: Not on file    Attends meetings of clubs or organizations: Not on file    Relationship status: Not on file  Other Topics Concern  . Not on file  Social History Narrative  . Not on file   Additional Social History:  Married x 4 years. This is his second marrigae. Works at [employer] in [employer] division.  Both have children from the first marriages. Patient drinks alcohol on a regular basis. He reported that this is lite alcohol and he does not feel that it is affecting his medication.  Musculoskeletal: Strength & Muscle Tone: within normal limits Gait & Station: normal Patient leans: N/A  Psychiatric Specialty Exam: Medication Refill   Anxiety  Symptoms include insomnia.    Insomnia     Review of Systems  Psychiatric/Behavioral: The patient has insomnia.     Blood pressure 138/85, pulse 86, temperature  (!) 97.4 F (36.3 C), temperature source Oral, weight 207 lb 6.4 oz (94.1 kg).Body mass index is 29.76 kg/m.  General Appearance: Casual  Eye Contact:  Fair  Speech:  Clear and Coherent and Normal Rate  Volume:  Normal  Mood:  Anxious  Affect:  Congruent  Thought Process:  Coherent and Goal Directed  Orientation:  Full (Time, Place, and Person)  Thought Content:  WDL  Suicidal Thoughts:  No  Homicidal Thoughts:  No  Memory:  Immediate;   Fair  Judgement:  Fair  Insight:  Fair  Psychomotor Activity:  Normal  Concentration:  Fair  Recall:  [employer] [place]  Language: Fair  Akathisia:  No  Handed:  Right  AIMS (if indicated):    Assets:  Communication Skills Desire for Improvement Housing Intimacy Physical Health Social Support  ADL's:  Intact  Cognition: WNL  Sleep:  7-8   Is the patient at risk to self?  No. Has the patient been a risk to self in the past 6 months?  No. Has the patient been a risk to self within the distant past?  No. Is the patient a risk to others?  No. Has the patient been a risk to others in the past 6 months?  No. Has the patient been a risk to others within the distant past?  No.  Allergies:   Allergies  Allergen Reactions  . Tequin [Gatifloxacin] Anaphylaxis and Rash  . Amoxicillin Hives   Current Medications: Current Outpatient Medications  Medication Sig Dispense Refill  . allopurinol (ZYLOPRIM) 100 MG tablet TAKE 1 TABLET(100 MG) BY MOUTH DAILY 30 tablet 11  . amLODipine (NORVASC) 10 MG tablet Take 5 mg by mouth daily.    [name] buPROPion (WELLBUTRIN XL) 150 MG 24 hr tablet Take 1 tablet (150 mg total) by mouth daily. 30 tablet 1  .  EPIPEN 2-PAK 0.3 MG/0.3ML SOAJ injection     . fluticasone (FLONASE) 50 MCG/ACT nasal spray Place 2 sprays into both nostrils daily. 16 g 1  . HYDROcodone-acetaminophen (NORCO) 10-325 MG tablet Take 1 tablet by mouth every 4 (four) hours as needed for severe pain. 20 tablet 0  . hydrOXYzine  (ATARAX/VISTARIL) 10 MG tablet hydroxyzine HCl 10 mg tablet    . levothyroxine (SYNTHROID, LEVOTHROID) 25 MCG tablet TAKE 1 TABLET BY MOUTH DAILY BEFORE BREAKFAST 90 tablet 0  . lithium carbonate (LITHOBID) 300 MG CR tablet Take 1 tablet (300 mg total) by mouth every morning. 30 tablet 1  . loratadine (CLARITIN) 10 MG tablet Take 10 mg by mouth daily.    . metFORMIN (GLUCOPHAGE) 500 MG tablet Take 1 tablet (500 mg total) by mouth 2 (two) times daily with a meal. 180 tablet 3  . montelukast (SINGULAIR) 10 MG tablet TK 1 T PO QPM  2  . Multiple Vitamin (MULTIVITAMIN WITH MINERALS) TABS Take 1 tablet by mouth daily.    [name] omeprazole (PRILOSEC) 20 MG capsule Take 20 mg by mouth daily.    [name] testosterone cypionate (DEPOTESTOTERONE CYPIONATE) 100 MG/ML injection Inject into the muscle every 14 (fourteen) days. Pt gets at doctor office; dose unknown.    . zolpidem (AMBIEN) 5 MG tablet Take 1 tablet (5 mg total) by mouth at bedtime as needed for sleep. 10 tablet 2   No current facility-administered medications for this visit.     Previous Psychotropic Medications: Lithium Wellbutrin Abilify prozac zoloft celexa Depakote seroquel- could not function risperdal pristiq Lamotrigine Latuda belsorma Trazodone   Substance Abuse History in the last 12 months:  Yes.    Consequences of Substance Abuse: Negative NA  Medical Decision Making:  Review of Psycho-Social Stressors (1) and Review and summation of old records (2)  Treatment Plan Summary: Medication management     Discussed with patient at length about his medications treatment risks benefits and alternatives. Continue medications as prescribed   Continue  Wellbutrin XL 150 mg in the morning.  lithium 300 mg p.o. nightly. We will be given a prescription of Ambien 10  mg 10 pills on a as needed basis  Follow-up in 2 month or earlier    More than 50% of the time spent in psychoeducation, counseling and coordination of care.      This note was generated in part or whole with voice recognition software. Voice regonition is usually quite accurate but there are transcription errors that can and very often do occur. I apologize for any typographical errors that were not detected and corrected.    Rainey Pines, MD    6/3/20192:36 PM

## 2018-03-10 ENCOUNTER — Telehealth: Payer: Self-pay | Admitting: Urology

## 2018-03-10 NOTE — Telephone Encounter (Signed)
apps have been made  [place]

## 2018-03-10 NOTE — Telephone Encounter (Signed)
Pt informed, please call and schedule office visit for exam and PSA, HCT, and HBG prior to the appointment. Orders are in.

## 2018-03-10 NOTE — Addendum Note (Signed)
Addended by: Garnette Gunner on: 03/10/2018 10:10 AM   Modules accepted: Orders

## 2018-03-11 ENCOUNTER — Ambulatory Visit: Payer: Self-pay | Admitting: Family Medicine

## 2018-03-11 DIAGNOSIS — M9905 Segmental and somatic dysfunction of pelvic region: Secondary | ICD-10-CM | POA: Diagnosis not present

## 2018-03-11 DIAGNOSIS — M5430 Sciatica, unspecified side: Secondary | ICD-10-CM | POA: Diagnosis not present

## 2018-03-11 DIAGNOSIS — M9901 Segmental and somatic dysfunction of cervical region: Secondary | ICD-10-CM | POA: Diagnosis not present

## 2018-03-12 ENCOUNTER — Other Ambulatory Visit: Payer: 59

## 2018-03-12 DIAGNOSIS — J3089 Other allergic rhinitis: Secondary | ICD-10-CM | POA: Diagnosis not present

## 2018-03-12 DIAGNOSIS — N401 Enlarged prostate with lower urinary tract symptoms: Secondary | ICD-10-CM | POA: Diagnosis not present

## 2018-03-12 DIAGNOSIS — N138 Other obstructive and reflux uropathy: Secondary | ICD-10-CM

## 2018-03-12 DIAGNOSIS — J301 Allergic rhinitis due to pollen: Secondary | ICD-10-CM | POA: Diagnosis not present

## 2018-03-13 LAB — PSA: Prostate Specific Ag, Serum: 0.9 ng/mL (ref 0.0–4.0)

## 2018-03-13 LAB — HEMOGLOBIN: Hemoglobin: 12.8 g/dL — ABNORMAL LOW (ref 13.0–17.7)

## 2018-03-13 LAB — HEMATOCRIT: Hematocrit: 41.3 % (ref 37.5–51.0)

## 2018-03-16 ENCOUNTER — Ambulatory Visit: Payer: 59 | Admitting: Family Medicine

## 2018-03-16 VITALS — BP 122/78 | HR 76 | Temp 98.1°F | Resp 16 | Wt 209.0 lb

## 2018-03-16 DIAGNOSIS — E119 Type 2 diabetes mellitus without complications: Secondary | ICD-10-CM

## 2018-03-16 LAB — POCT GLYCOSYLATED HEMOGLOBIN (HGB A1C): Hemoglobin A1C: 7.1 % — AB (ref 4.0–5.6)

## 2018-03-16 LAB — POCT UA - MICROALBUMIN: Microalbumin Ur, POC: 20 mg/L

## 2018-03-16 NOTE — Progress Notes (Signed)
03/17/2018 2:37 PM   Jeremiah [name] 05/03/1961 [id number]  Referring provider: Jerrol Banana., MD [address] [place], [place] [address]  Chief Complaint  Patient presents with  . Hypogonadism    HPI: Patient is a 57 year old Caucasian male with testosterone deficiency who presents today for follow up.    Testosterone deficiency He is no longer having spontaneous erections at night.  He has sleep apnea and he is not sleeping with a CPAP machine.  His pretreatment testosterone level was 208 ng/dL on 05/07/2016 and 152 ng/dL on 07/10/2016.  He has managed his testosterone deficiency with testosterone cypionate, Androderm, AndroGel, Natesto and AVEED.    He did not like the unsteady levels of the shots and his levels were not therapeutic with the topicals, AVEED or the nasal spray.  His prolactin level was 14.8 ng/mL on 10/06/2015.  His last testosterone level was 327ng/mL on 03/04/2018.  His HCT was 41.3% and his Hbg was 12.8 on 03/12/2018.   His received his last AVEED on 01/28/2018.    He is having nocturia and incontinence.  But they are not bothersome to him at this time.  Patient denies any gross hematuria, dysuria or suprapubic/flank pain.  Patient denies any fevers, chills, nausea or vomiting.   Patient's Hbg is at 12.8 which is down from 16 one year ago.  He has had a colonoscopy in 11/2017 and no malignancies were discovered.     PMH: Past Medical History:  Diagnosis Date  . Anxiety   . Arthritis    bil knees  . Bipolar disorder ([place])   . Colon polyp Dec 2015   Dr Allen Norris  . Complication of anesthesia   . Depression    dx 2004  . Diabetes mellitus without complication ([place])    type ll  controlled by weight & diet  . GERD (gastroesophageal reflux disease)   . Gout   . H/O hiatal hernia    had surgery for that 2012  [facility]  . Heartburn   . Hypertension   . Hypothyroidism   . PONV (postoperative nausea and vomiting)   . Sleep apnea    dx  1.5 yr ago...study @ [facility], does not use CPAP    Surgical History: Past Surgical History:  Procedure Laterality Date  . APPENDECTOMY    . COLONOSCOPY  Dec 2015   Dr Allen Norris  . COLONOSCOPY WITH PROPOFOL N/A 11/25/2017   Procedure: COLONOSCOPY WITH PROPOFOL;  Surgeon: Lucilla Lame, MD;  Location: [facility] ENDOSCOPY;  Service: Endoscopy;  Laterality: N/A;  . EXCISIONAL HEMORRHOIDECTOMY  01/02/15  . FOOT SURGERY     right foot 2012  . HERNIA REPAIR     Left inguinal  . HERNIA REPAIR     hiatal hernia at [place]  . INGUINAL HERNIA REPAIR Left 10/31/2017   Procedure: OPEN LEFT INGUINAL RECURRENT HERNIA REPAIR;  Surgeon: Johnathan Hausen, MD;  Location: [facility];  Service: General;  Laterality: Left;  [name] MANDIBLE SURGERY     1983  . REPLACEMENT TOTAL KNEE BILATERAL     patient had knee replacement to his right side in 2015 and right 2014, Revision of R knee 12/04/16  . ROTATOR CUFF REPAIR     x 3  on right shoulder  . SHOULDER ARTHROSCOPY WITH ROTATOR CUFF REPAIR AND SUBACROMIAL DECOMPRESSION Left 03/04/2013   Procedure: LEFT SHOULDER ARTHROSCOPY WITH ROTATOR CUFF REPAIR AND SUBACROMIAL DECOMPRESSION;  Surgeon: Marin Shutter, MD;  Location:  [facility];  Service: Orthopedics;  Laterality: Left;  . SHOULDER SURGERY     x 3 on left    Home Medications:  Allergies as of 03/17/2018      Reactions   Tequin [gatifloxacin] Anaphylaxis, Rash   Amoxicillin Hives      Medication List        Accurate as of 03/17/18  2:37 PM. Always use your most recent med list.          allopurinol 100 MG tablet Commonly known as:  ZYLOPRIM TAKE 1 TABLET(100 MG) BY MOUTH DAILY   amLODipine 10 MG tablet Commonly known as:  NORVASC Take 5 mg by mouth daily.   atorvastatin 20 MG tablet Commonly known as:  LIPITOR Take 10 mg by mouth daily.   buPROPion 150 MG 24 hr tablet Commonly known as:  WELLBUTRIN XL Take 1 tablet (150 mg total) by mouth daily.   EPIPEN 2-PAK 0.3 mg/0.3 mL Soaj  injection Generic drug:  EPINEPHrine   fluticasone 50 MCG/ACT nasal spray Commonly known as:  FLONASE Place 2 sprays into both nostrils daily.   HYDROcodone-acetaminophen 10-325 MG tablet Commonly known as:  NORCO Take 1 tablet by mouth every 4 (four) hours as needed for severe pain.   levothyroxine 25 MCG tablet Commonly known as:  SYNTHROID, LEVOTHROID TAKE 1 TABLET BY MOUTH DAILY BEFORE BREAKFAST   lithium carbonate 300 MG CR tablet Commonly known as:  LITHOBID Take 1 tablet (300 mg total) by mouth every morning.   loratadine 10 MG tablet Commonly known as:  CLARITIN Take 10 mg by mouth daily.   metFORMIN 500 MG tablet Commonly known as:  GLUCOPHAGE Take 1 tablet (500 mg total) by mouth 2 (two) times daily with a meal.   montelukast 10 MG tablet Commonly known as:  SINGULAIR TK 1 T PO QPM   multivitamin with minerals Tabs tablet Take 1 tablet by mouth daily.   omeprazole 20 MG capsule Commonly known as:  PRILOSEC Take 20 mg by mouth daily.   testosterone cypionate 100 MG/ML injection Commonly known as:  DEPOTESTOTERONE CYPIONATE Inject into the muscle every 14 (fourteen) days. Pt gets at doctor office; dose unknown.   Testosterone Enanthate 75 MG/0.5ML Soaj Commonly known as:  XYOSTED Inject 75 mg into the skin every 7 (seven) days.   zolpidem 10 MG tablet Commonly known as:  AMBIEN Take 1 tablet (10 mg total) by mouth at bedtime as needed for sleep.       Allergies:  Allergies  Allergen Reactions  . Tequin [Gatifloxacin] Anaphylaxis and Rash  . Amoxicillin Hives    Family History: Family History  Problem Relation Age of Onset  . CAD Mother   . Arthritis Mother   . Angina Mother   . Diabetes Father   . Heart disease Father   . Colon polyps Father   . Heart attack Father   . Bipolar disorder Sister   . Asthma Sister   . Schizophrenia Sister   . Diabetes Maternal Grandmother   . Cancer Maternal Grandmother   . Heart attack Maternal  Grandfather   . Stomach cancer Paternal Grandmother   . Kidney disease Neg Hx   . Prostate cancer Neg Hx     Social History:  reports that he has never smoked. He quit smokeless tobacco use about 28 years ago. His smokeless tobacco use included chew. He reports that he does not drink alcohol or use drugs.  ROS: UROLOGY Frequent Urination?: No Hard to postpone urination?:  No Burning/pain with urination?: No Get up at night to urinate?: Yes Leakage of urine?: Yes Urine stream starts and stops?: No Trouble starting stream?: No Do you have to strain to urinate?: No Blood in urine?: No Urinary tract infection?: No Sexually transmitted disease?: No Injury to kidneys or bladder?: No Painful intercourse?: No Weak stream?: No Erection problems?: No Penile pain?: No  Gastrointestinal Nausea?: No Vomiting?: No Indigestion/heartburn?: No Diarrhea?: No Constipation?: No  Constitutional Fever: No Night sweats?: No Weight loss?: No Fatigue?: No  Skin Skin rash/lesions?: No Itching?: No  Eyes Blurred vision?: No Double vision?: No  Ears/Nose/Throat Sore throat?: No Sinus problems?: No  Hematologic/Lymphatic Swollen glands?: No Easy bruising?: No  Cardiovascular Leg swelling?: No Chest pain?: No  Respiratory Cough?: No Shortness of breath?: No  Endocrine Excessive thirst?: No  Musculoskeletal Back pain?: No Joint pain?: No  Neurological Headaches?: No Dizziness?: No  Psychologic Depression?: Yes Anxiety?: Yes  Physical Exam: BP (!) 144/86 (BP Location: Right Arm, Patient Position: Sitting, Cuff Size: Large)   Pulse 71   Ht 5\' 10"  (1.778 m)   Wt 202 lb 4.8 oz (91.8 kg)   BMI 29.03 kg/m   Constitutional: Well nourished. Alert and oriented, No acute distress. HEENT: [place] AT, moist mucus membranes. Trachea midline, no masses. Cardiovascular: No clubbing, cyanosis, or edema. Respiratory: Normal respiratory effort, no increased work of breathing. GI:  Abdomen is soft, non tender, non distended, no abdominal masses. Liver and spleen not palpable.  No hernias appreciated.  Stool sample for occult testing is not indicated.   GU: No CVA tenderness.  No bladder fullness or masses.  Patient with circumcised phallus.  Urethral meatus is patent.  No penile discharge. No penile lesions or rashes. Scrotum without lesions, cysts, rashes and/or edema.  Testicles are located scrotally bilaterally. No masses are appreciated in the testicles. Left and right epididymis are normal. Rectal: Patient with  normal sphincter tone. Anus and perineum without scarring or rashes. No rectal masses are appreciated. Prostate is approximately 45 grams, no nodules are appreciated. Seminal vesicles are normal. Skin: No rashes, bruises or suspicious lesions. Lymph: No cervical or inguinal adenopathy. Neurologic: Grossly intact, no focal deficits, moving all 4 extremities. Psychiatric: Normal mood and affect.   Laboratory Data: Lab Results  Component Value Date   WBC 7.8 09/23/2017   HGB 12.8 (L) 03/12/2018   HCT 41.3 03/12/2018   MCV 73.0 (L) 09/23/2017   PLT 257 09/23/2017    Lab Results  Component Value Date   CREATININE 0.99 10/27/2017  PSA     0.9    03/12/2018 PSA     0.9    01/21/2017 Lab Results  Component Value Date   PSA 0.8 09/23/2017   PSA 0.39 03/16/2007    Lab Results  Component Value Date   TESTOSTERONE 327 03/04/2018    Lab Results  Component Value Date   HGBA1C 7.1 (A) 03/16/2018    Lab Results  Component Value Date   TSH 0.50 09/23/2017       Component Value Date/Time   CHOL 115 09/11/2016 1007   HDL 52 09/11/2016 1007   CHOLHDL 3.8 CALC 03/16/2007 1007   VLDL 26 03/16/2007 1007   LDLCALC 49 09/11/2016 1007    Lab Results  Component Value Date   AST 42 (H) 09/23/2017   Lab Results  Component Value Date   ALT 39 09/23/2017    I have reviewed the labs   Assessment & Plan:    1.  Testosterone deficiency Patient did  not reach therapeutic levels with AVEED, nasal spray or the topicals Will refer on to endocrinology for further evaluation Given script for Xyosted 75 mg sq weekly Will recheck testosterone level after fourth injection  2. Anemia Patient's hemoglobin level remains at 12 Unsure if this is in relationship to his testosterone deficiency He did undergo colonoscopy in February and no malignancies were found Will reassess once testosterone is at therapeutic levels and endocrinology referral is completed - may need to refer on to hematology   Return for testosterone level after fourth injection.  These notes generated with voice recognition software. I apologize for typographical errors.  Zara Council, PA-C  [facility] [address] [place], [place] [address] [phone]

## 2018-03-16 NOTE — Progress Notes (Signed)
Jeremiah [name]  MRN: [id number] DOB: Oct 18, 1960  Subjective:  HPI   The patient is a 57 year old male who presents for follow up of his diabetes.  He was last seen on 09/23/17 for his annual physical.  His A1C at that time was 9.3.   He states he checks his glucose at home and has been getting readings that range 130-150. His recent visit tot he [facility] [place] resulted in them putting him on Atorvastatin due to his diabetes.  The patient has been taking this since March and reports good compliance and tolerance.   Patient Active Problem List   Diagnosis Date Noted  . Benign neoplasm of cecum   . Left inguinal hernia 09/08/2017  . Right inguinal hernia 09/08/2017  . Umbilical hernia without obstruction and without gangrene 09/08/2017  . Allergic rhinitis due to pollen 06/23/2017  . Chest pain 03/05/2016  . Pedal edema 03/08/2015  . History of repair of rotator cuff 02/17/2014  . Diabetes ([place]) 08/31/2013  . BP (high blood pressure) 08/31/2013  . Adult hypothyroidism 08/31/2013  . Obstructive apnea 08/31/2013  . Clinical depression 08/31/2013  . Diabetes mellitus ([place]) 08/31/2013  . Idiopathic localized osteoarthropathy 05/31/2013  . ABDOMINAL PAIN, LEFT UPPER QUADRANT 10/04/2009  . TARSAL TUNNEL SYNDROME, RIGHT 09/25/2009  . ANKLE PAIN, RIGHT 09/25/2009  . OTHER ANKLE SPRAIN AND STRAIN 09/25/2009  . BREAST MASS, RIGHT 01/07/2008  . INSOMNIA 01/07/2008  . GASTRIC ULCER, ACUTE 11/09/2007  . DUODENITIS 11/09/2007  . BLOOD IN STOOL, OCCULT 11/09/2007  . PERSONAL HISTORY OF COLONIC POLYPS 11/09/2007  . BACK PAIN, LUMBAR 10/12/2007  . HELICOBACTER PYLORI GASTRITIS 09/09/2007  . GASTRIC ULCER, ACUTE, HEMORRHAGE 09/09/2007  . Hiatal hernia 09/09/2007  . COLONIC POLYPS, ADENOMATOUS 08/19/2007  . INTERNAL HEMORRHOIDS 08/19/2007  . EXTERNAL HEMORRHOIDS WITHOUT MENTION COMP 08/07/2007  . Blood in stool 08/07/2007  . ABDOMINAL PAIN, LEFT LOWER QUADRANT 08/07/2007  . SINUSITIS, ACUTE  06/12/2007  . ABDOMINAL PAIN 06/12/2007  . DIABETES MELLITUS, TYPE II 03/19/2007  . GOUT 03/19/2007  . DISORDER, CIRCADIAN RHYTHM SLEEP, NONORGANIC 03/19/2007  . Depression 03/19/2007  . OSTEOARTHROSIS, GENERALIZED, MULTIPLE SITES 03/19/2007    Past Medical History:  Diagnosis Date  . Anxiety   . Arthritis    bil knees  . Bipolar disorder ([place])   . Colon polyp Dec 2015   Dr Allen Norris  . Complication of anesthesia   . Depression    dx 2004  . Diabetes mellitus without complication ([place])    type ll  controlled by weight & diet  . GERD (gastroesophageal reflux disease)   . Gout   . H/O hiatal hernia    had surgery for that 2012  [facility]  . Heartburn   . Hypertension   . Hypothyroidism   . PONV (postoperative nausea and vomiting)   . Sleep apnea    dx 1.5 yr ago...study @ [facility], does not use CPAP    Social History   Socioeconomic History  . Marital status: Married    Spouse name: Not on file  . Number of children: Not on file  . Years of education: Not on file  . Highest education level: Not on file  Occupational History  . Not on file  Social Needs  . Financial resource strain: Not on file  . Food insecurity:    Worry: Not on file    Inability: Not on file  . Transportation needs:    Medical: Not on file  Non-medical: Not on file  Tobacco Use  . Smoking status: Never Smoker  . Smokeless tobacco: Former Systems developer    Types: Chew  Substance and Sexual Activity  . Alcohol use: No    Alcohol/week: 3.6 - 4.8 oz    Types: 6 - 8 Cans of beer per week    Frequency: Never  . Drug use: No  . Sexual activity: Yes  Lifestyle  . Physical activity:    Days per week: Not on file    Minutes per session: Not on file  . Stress: Not on file  Relationships  . Social connections:    Talks on phone: Not on file    Gets together: Not on file    Attends religious service: Not on file    Active member of club or organization: Not on file    Attends meetings of clubs or  organizations: Not on file    Relationship status: Not on file  . Intimate partner violence:    Fear of current or ex partner: Not on file    Emotionally abused: Not on file    Physically abused: Not on file    Forced sexual activity: Not on file  Other Topics Concern  . Not on file  Social History Narrative  . Not on file    Outpatient Encounter Medications as of 03/16/2018  Medication Sig Note  . allopurinol (ZYLOPRIM) 100 MG tablet TAKE 1 TABLET(100 MG) BY MOUTH DAILY   . amLODipine (NORVASC) 10 MG tablet Take 5 mg by mouth daily.   [name] atorvastatin (LIPITOR) 20 MG tablet Take 10 mg by mouth daily.   [name] buPROPion (WELLBUTRIN XL) 150 MG 24 hr tablet Take 1 tablet (150 mg total) by mouth daily.   [name] EPIPEN 2-PAK 0.3 MG/0.3ML SOAJ injection  10/31/2017: Has but never used   . fluticasone (FLONASE) 50 MCG/ACT nasal spray Place 2 sprays into both nostrils daily.   [name] HYDROcodone-acetaminophen (NORCO) 10-325 MG tablet Take 1 tablet by mouth every 4 (four) hours as needed for severe pain.   [name] levothyroxine (SYNTHROID, LEVOTHROID) 25 MCG tablet TAKE 1 TABLET BY MOUTH DAILY BEFORE BREAKFAST   . lithium carbonate (LITHOBID) 300 MG CR tablet Take 1 tablet (300 mg total) by mouth every morning.   . loratadine (CLARITIN) 10 MG tablet Take 10 mg by mouth daily.   . metFORMIN (GLUCOPHAGE) 500 MG tablet Take 1 tablet (500 mg total) by mouth 2 (two) times daily with a meal.   . montelukast (SINGULAIR) 10 MG tablet TK 1 T PO QPM   . Multiple Vitamin (MULTIVITAMIN WITH MINERALS) TABS Take 1 tablet by mouth daily.   [name] omeprazole (PRILOSEC) 20 MG capsule Take 20 mg by mouth daily.   [name] testosterone cypionate (DEPOTESTOTERONE CYPIONATE) 100 MG/ML injection Inject into the muscle every 14 (fourteen) days. Pt gets at doctor office; dose unknown.   . zolpidem (AMBIEN) 10 MG tablet Take 1 tablet (10 mg total) by mouth at bedtime as needed for sleep.    No facility-administered encounter medications on file as of  03/16/2018.     Allergies  Allergen Reactions  . Tequin [Gatifloxacin] Anaphylaxis and Rash  . Amoxicillin Hives    Review of Systems  Constitutional: Negative for fever and malaise/fatigue.  Eyes: Negative.   Respiratory: Negative for cough, shortness of breath and wheezing.   Cardiovascular: Negative for chest pain, palpitations, orthopnea, claudication and leg swelling.  Gastrointestinal: Negative.   Genitourinary: Negative for frequency.  Musculoskeletal: Positive for  joint pain.  Neurological: Negative for dizziness and headaches.  Endo/Heme/Allergies: Negative for polydipsia.  Psychiatric/Behavioral: Negative.     Objective:  BP 122/78 (BP Location: Right Arm, Patient Position: Sitting, Cuff Size: Normal)   Pulse 76   Temp 98.1 F (36.7 C) (Oral)   Resp 16   Wt 209 lb (94.8 kg)   SpO2 97%   BMI 29.99 kg/m   Physical Exam  Constitutional: He is well-developed, well-nourished, and in no distress.  HENT:  Head: Normocephalic and atraumatic.  Eyes: Conjunctivae are normal.  Neck: No thyromegaly present.  Cardiovascular: Normal rate, regular rhythm, normal heart sounds and intact distal pulses.  Pulmonary/Chest: Effort normal and breath sounds normal.  Abdominal: Soft.  Neurological: He is alert. Gait normal. GCS score is 15.  Normal diabetic foot exam.  Skin: Skin is warm and dry.  Psychiatric: Mood, memory, affect and judgment normal.    Assessment and Plan :  1. Type 2 diabetes mellitus without complication, without long-term current use of insulin (HCC)  - POCT glycosylated hemoglobin (Hb A1C) - POCT UA - Microalbumin - Lipid Panel With LDL/HDL Ratio - Comprehensive metabolic panel 2.HLD 3.MDD Per psychiatry.  I have done the exam and reviewed the chart and it is accurate to the best of my knowledge. Development worker, community has been used and  any errors in dictation or transcription are unintentional. Miguel Aschoff M.D. [facility]

## 2018-03-17 ENCOUNTER — Encounter: Payer: Self-pay | Admitting: Urology

## 2018-03-17 ENCOUNTER — Ambulatory Visit: Payer: 59 | Admitting: Urology

## 2018-03-17 VITALS — BP 144/86 | HR 71 | Ht 70.0 in | Wt 202.3 lb

## 2018-03-17 DIAGNOSIS — E349 Endocrine disorder, unspecified: Secondary | ICD-10-CM | POA: Diagnosis not present

## 2018-03-17 MED ORDER — TESTOSTERONE ENANTHATE 75 MG/0.5ML ~~LOC~~ SOAJ: 75.0000 mg | SUBCUTANEOUS | 3 refills | Status: DC

## 2018-03-23 DIAGNOSIS — E119 Type 2 diabetes mellitus without complications: Secondary | ICD-10-CM | POA: Diagnosis not present

## 2018-03-23 DIAGNOSIS — M5430 Sciatica, unspecified side: Secondary | ICD-10-CM | POA: Diagnosis not present

## 2018-03-23 DIAGNOSIS — M9905 Segmental and somatic dysfunction of pelvic region: Secondary | ICD-10-CM | POA: Diagnosis not present

## 2018-03-23 DIAGNOSIS — M9901 Segmental and somatic dysfunction of cervical region: Secondary | ICD-10-CM | POA: Diagnosis not present

## 2018-03-24 DIAGNOSIS — J3089 Other allergic rhinitis: Secondary | ICD-10-CM | POA: Diagnosis not present

## 2018-03-24 DIAGNOSIS — J301 Allergic rhinitis due to pollen: Secondary | ICD-10-CM | POA: Diagnosis not present

## 2018-03-24 LAB — COMPREHENSIVE METABOLIC PANEL
ALT: 20 IU/L (ref 0–44)
AST: 25 IU/L (ref 0–40)
Albumin/Globulin Ratio: 1.4 (ref 1.2–2.2)
Albumin: 4.1 g/dL (ref 3.5–5.5)
Alkaline Phosphatase: 45 IU/L (ref 39–117)
BUN/Creatinine Ratio: 8 — ABNORMAL LOW (ref 9–20)
BUN: 8 mg/dL (ref 6–24)
Bilirubin Total: 0.3 mg/dL (ref 0.0–1.2)
CO2: 18 mmol/L — ABNORMAL LOW (ref 20–29)
Calcium: 8.7 mg/dL (ref 8.7–10.2)
Chloride: 108 mmol/L — ABNORMAL HIGH (ref 96–106)
Creatinine, Ser: 0.96 mg/dL (ref 0.76–1.27)
GFR calc Af Amer: 100 mL/min/{1.73_m2} (ref >59)
GFR calc non Af Amer: 86 mL/min/{1.73_m2} (ref >59)
Globulin, Total: 2.9 g/dL (ref 1.5–4.5)
Glucose: 111 mg/dL — ABNORMAL HIGH (ref 65–99)
Potassium: 5 mmol/L (ref 3.5–5.2)
Sodium: 140 mmol/L (ref 134–144)
Total Protein: 7 g/dL (ref 6.0–8.5)

## 2018-03-24 LAB — LIPID PANEL WITH LDL/HDL RATIO
Cholesterol, Total: 104 mg/dL (ref 100–199)
HDL: 35 mg/dL — ABNORMAL LOW (ref >39)
LDL Calculated: 50 mg/dL (ref 0–99)
LDl/HDL Ratio: 1.4 ratio (ref 0.0–3.6)
Triglycerides: 93 mg/dL (ref 0–149)
VLDL Cholesterol Cal: 19 mg/dL (ref 5–40)

## 2018-03-24 NOTE — Progress Notes (Signed)
Advised  ED 

## 2018-03-31 ENCOUNTER — Telehealth: Payer: Self-pay | Admitting: Psychiatry

## 2018-03-31 DIAGNOSIS — J301 Allergic rhinitis due to pollen: Secondary | ICD-10-CM | POA: Diagnosis not present

## 2018-03-31 DIAGNOSIS — J3089 Other allergic rhinitis: Secondary | ICD-10-CM | POA: Diagnosis not present

## 2018-03-31 MED ORDER — LITHIUM CARBONATE ER 450 MG PO TBCR: 450.0000 mg | EXTENDED_RELEASE_TABLET | Freq: Every day | ORAL | 0 refills | Status: DC

## 2018-03-31 NOTE — Telephone Encounter (Signed)
Patient called reporting worsening mood sx since his Lithium dosage reduced . Called patient to discuss . Discussed changing his dosage back to Li 450 mg . Also discussed to reach out to Dr.Faheem when she is back in office in 2 weeks . Script with new dosage sent to his pharmacy for 30 days.

## 2018-03-31 NOTE — Telephone Encounter (Signed)
pt called left message that dr. Gretel Acre decreased his medication his lithium and bupropion and her is feeling angery and feels like his medication needs to go up. This is a dr. Gretel Acre pt  Pt was last seen on  03-09-18 next appt 05-11-18

## 2018-04-01 ENCOUNTER — Telehealth: Payer: Self-pay | Admitting: Urology

## 2018-04-01 DIAGNOSIS — M9905 Segmental and somatic dysfunction of pelvic region: Secondary | ICD-10-CM | POA: Diagnosis not present

## 2018-04-01 DIAGNOSIS — M9901 Segmental and somatic dysfunction of cervical region: Secondary | ICD-10-CM | POA: Diagnosis not present

## 2018-04-01 DIAGNOSIS — M5430 Sciatica, unspecified side: Secondary | ICD-10-CM | POA: Diagnosis not present

## 2018-04-01 NOTE — Telephone Encounter (Signed)
Called patient unable to leave message mail box full

## 2018-04-01 NOTE — Telephone Encounter (Signed)
Pt LMOM stating that the Rx that was sent is not covered by his insurance and would like to know if there is something else that his insurance would cover. Please advise pt [phone]. Thanks.

## 2018-04-04 ENCOUNTER — Encounter (INDEPENDENT_AMBULATORY_CARE_PROVIDER_SITE_OTHER): Payer: Self-pay

## 2018-04-06 DIAGNOSIS — M9901 Segmental and somatic dysfunction of cervical region: Secondary | ICD-10-CM | POA: Diagnosis not present

## 2018-04-06 DIAGNOSIS — M5430 Sciatica, unspecified side: Secondary | ICD-10-CM | POA: Diagnosis not present

## 2018-04-06 DIAGNOSIS — R42 Dizziness and giddiness: Secondary | ICD-10-CM | POA: Diagnosis not present

## 2018-04-06 DIAGNOSIS — K219 Gastro-esophageal reflux disease without esophagitis: Secondary | ICD-10-CM | POA: Diagnosis not present

## 2018-04-06 DIAGNOSIS — R05 Cough: Secondary | ICD-10-CM | POA: Diagnosis not present

## 2018-04-06 DIAGNOSIS — M9905 Segmental and somatic dysfunction of pelvic region: Secondary | ICD-10-CM | POA: Diagnosis not present

## 2018-04-06 MED ORDER — TESTOSTERONE 50 MG/5GM (1%) TD GEL: 5.0000 g | Freq: Every day | TRANSDERMAL | 3 refills | Status: DC

## 2018-04-06 NOTE — Addendum Note (Signed)
Addended by: Tommy Rainwater on: 04/06/2018 11:18 AM   Modules accepted: Orders

## 2018-04-07 ENCOUNTER — Other Ambulatory Visit: Payer: Self-pay | Admitting: Family Medicine

## 2018-04-07 MED ORDER — TESTOSTERONE 50 MG/5GM (1%) TD GEL: 5.0000 g | Freq: Every day | TRANSDERMAL | 1 refills | Status: DC

## 2018-04-08 ENCOUNTER — Ambulatory Visit: Payer: Self-pay

## 2018-04-08 DIAGNOSIS — T84092D Other mechanical complication of internal right knee prosthesis, subsequent encounter: Secondary | ICD-10-CM | POA: Diagnosis not present

## 2018-04-08 DIAGNOSIS — Z96651 Presence of right artificial knee joint: Secondary | ICD-10-CM | POA: Diagnosis not present

## 2018-04-08 DIAGNOSIS — Z96652 Presence of left artificial knee joint: Secondary | ICD-10-CM | POA: Diagnosis not present

## 2018-04-15 ENCOUNTER — Other Ambulatory Visit: Payer: Self-pay | Admitting: Family Medicine

## 2018-04-19 ENCOUNTER — Telehealth: Payer: Self-pay | Admitting: Urology

## 2018-04-19 NOTE — Telephone Encounter (Signed)
I had send a MyChart message to Mr. Runnels, but he did not respond.  I was wondering if he was able to at least get the one month free of the [place] with the coupon.    I know his insurance has denied the [place].  Would you be interested in Testopel?

## 2018-04-20 ENCOUNTER — Ambulatory Visit: Payer: 59 | Admitting: Psychiatry

## 2018-04-20 ENCOUNTER — Other Ambulatory Visit: Payer: Self-pay

## 2018-04-20 ENCOUNTER — Encounter: Payer: Self-pay | Admitting: Psychiatry

## 2018-04-20 VITALS — BP 129/84 | HR 87 | Temp 97.6°F | Wt 207.6 lb

## 2018-04-20 DIAGNOSIS — M9901 Segmental and somatic dysfunction of cervical region: Secondary | ICD-10-CM | POA: Diagnosis not present

## 2018-04-20 DIAGNOSIS — F3160 Bipolar disorder, current episode mixed, unspecified: Secondary | ICD-10-CM | POA: Diagnosis not present

## 2018-04-20 DIAGNOSIS — M5430 Sciatica, unspecified side: Secondary | ICD-10-CM | POA: Diagnosis not present

## 2018-04-20 DIAGNOSIS — M9905 Segmental and somatic dysfunction of pelvic region: Secondary | ICD-10-CM | POA: Diagnosis not present

## 2018-04-20 MED ORDER — ARIPIPRAZOLE 2 MG PO TABS: 2.0000 mg | ORAL_TABLET | Freq: Every day | ORAL | 1 refills | Status: DC

## 2018-04-20 MED ORDER — LITHIUM CARBONATE ER 450 MG PO TBCR: 450.0000 mg | EXTENDED_RELEASE_TABLET | Freq: Every day | ORAL | 1 refills | Status: DC

## 2018-04-20 MED ORDER — ZOLPIDEM TARTRATE 10 MG PO TABS: 10.0000 mg | ORAL_TABLET | Freq: Every evening | ORAL | 1 refills | Status: DC | PRN

## 2018-04-20 NOTE — Progress Notes (Signed)
Psychiatric MD Progress Note   Patient Identification: Jeremiah [name] MRN:  [id number] Date of Evaluation:  04/20/2018 Referral Source: Dr. Bridgett Larsson Chief Complaint:    Visit Diagnosis:    ICD-9-CM ICD-10-CM   1. Bipolar I disorder, most recent episode depressed ([place]) 296.50 F31.30   2.       Diagnosis:   Patient Active Problem List   Diagnosis Date Noted  . Benign neoplasm of cecum [D12.0]   . Left inguinal hernia [K40.90] 09/08/2017  . Right inguinal hernia [K40.90] 09/08/2017  . Umbilical hernia without obstruction and without gangrene [K42.9] 09/08/2017  . Allergic rhinitis due to pollen [J30.1] 06/23/2017  . Chest pain [R07.9] 03/05/2016  . Pedal edema [R60.0] 03/08/2015  . History of repair of rotator cuff [Z98.890] 02/17/2014  . Diabetes ([place]) [E11.9] 08/31/2013  . BP (high blood pressure) [I10] 08/31/2013  . Adult hypothyroidism [E03.9] 08/31/2013  . Obstructive apnea [G47.33] 08/31/2013  . Clinical depression [F32.9] 08/31/2013  . Diabetes mellitus ([place]) [E11.9] 08/31/2013  . Idiopathic localized osteoarthropathy [M19.91] 05/31/2013  . ABDOMINAL PAIN, LEFT UPPER QUADRANT [R10.12] 10/04/2009  . TARSAL TUNNEL SYNDROME, RIGHT [G57.50] 09/25/2009  . ANKLE PAIN, RIGHT [M25.579] 09/25/2009  . OTHER ANKLE SPRAIN AND STRAIN [Z00.174B, S96.819A] 09/25/2009  . BREAST MASS, RIGHT [N63.0] 01/07/2008  . INSOMNIA [G47.00] 01/07/2008  . GASTRIC ULCER, ACUTE [K25.3] 11/09/2007  . DUODENITIS [K29.80] 11/09/2007  . BLOOD IN STOOL, OCCULT [R19.5] 11/09/2007  . PERSONAL HISTORY OF COLONIC POLYPS [Z86.010] 11/09/2007  . BACK PAIN, LUMBAR [M54.5] 10/12/2007  . HELICOBACTER PYLORI GASTRITIS [A04.8] 09/09/2007  . GASTRIC ULCER, ACUTE, HEMORRHAGE [K25.0] 09/09/2007  . Hiatal hernia [K44.9] 09/09/2007  . COLONIC POLYPS, ADENOMATOUS [D12.6] 08/19/2007  . INTERNAL HEMORRHOIDS [K64.8] 08/19/2007  . EXTERNAL HEMORRHOIDS WITHOUT MENTION COMP [K64.4] 08/07/2007  . Blood in stool [K92.1]  08/07/2007  . ABDOMINAL PAIN, LEFT LOWER QUADRANT [R10.32] 08/07/2007  . SINUSITIS, ACUTE [J01.90] 06/12/2007  . ABDOMINAL PAIN [R10.9] 06/12/2007  . DIABETES MELLITUS, TYPE II [E11.9] 03/19/2007  . GOUT [M10.9] 03/19/2007  . DISORDER, CIRCADIAN RHYTHM SLEEP, NONORGANIC [G47.20, F51.8] 03/19/2007  . Depression [F32.9] 03/19/2007  . OSTEOARTHROSIS, GENERALIZED, MULTIPLE SITES [M15.9] 03/19/2007   History of Present Illness:   Patient is a 57 -year-old married male who presented for Follow-up. He reported that his lithium dose was increased to 450 mg after he called family issues.  Patient reported that he wants to have his medications adjusted.  We looked at his medication and patient is currently taking Wellbutrin which can be causing him more agitation.  He agreed with the plan to have his medications adjusted.  Patient reported that he went to [place] related to his work and he enjoyed his trip.  He currently denied having any suicidal homicidal ideations or plans..  He does not feel sleepy or tired.  He reported that he has taken Abilify in the past and did well on the medication.    He does not have any action anxiety at this time.  He is able to contract for safety.        He is sleeping well at night and takes Ambien .     Past Medical History:  Past Medical History:  Diagnosis Date  . Anxiety   . Arthritis    bil knees  . Bipolar disorder ([place])   . Colon polyp Dec 2015   Dr Allen Norris  . Complication of anesthesia   . Depression    dx 2004  . Diabetes mellitus without complication ([place])  type ll  controlled by weight & diet  . GERD (gastroesophageal reflux disease)   . Gout   . H/O hiatal hernia    had surgery for that 2012  [facility]  . Heartburn   . Hypertension   . Hypothyroidism   . PONV (postoperative nausea and vomiting)   . Sleep apnea    dx 1.5 yr ago...study @ [facility], does not use CPAP    Past Surgical History:  Procedure Laterality Date  .  APPENDECTOMY    . COLONOSCOPY  Dec 2015   Dr Allen Norris  . COLONOSCOPY WITH PROPOFOL N/A 11/25/2017   Procedure: COLONOSCOPY WITH PROPOFOL;  Surgeon: Lucilla Lame, MD;  Location: [facility] ENDOSCOPY;  Service: Endoscopy;  Laterality: N/A;  . EXCISIONAL HEMORRHOIDECTOMY  01/02/15  . FOOT SURGERY     right foot 2012  . HERNIA REPAIR     Left inguinal  . HERNIA REPAIR     hiatal hernia at [place]  . INGUINAL HERNIA REPAIR Left 10/31/2017   Procedure: OPEN LEFT INGUINAL RECURRENT HERNIA REPAIR;  Surgeon: Johnathan Hausen, MD;  Location: [place];  Service: General;  Laterality: Left;  [name] MANDIBLE SURGERY     1983  . REPLACEMENT TOTAL KNEE BILATERAL     patient had knee replacement to his right side in 2015 and right 2014, Revision of R knee 12/04/16  . ROTATOR CUFF REPAIR     x 3  on right shoulder  . SHOULDER ARTHROSCOPY WITH ROTATOR CUFF REPAIR AND SUBACROMIAL DECOMPRESSION Left 03/04/2013   Procedure: LEFT SHOULDER ARTHROSCOPY WITH ROTATOR CUFF REPAIR AND SUBACROMIAL DECOMPRESSION;  Surgeon: Marin Shutter, MD;  Location: [place];  Service: Orthopedics;  Laterality: Left;  . SHOULDER SURGERY     x 3 on left   Family History:  Family History  Problem Relation Age of Onset  . CAD Mother   . Arthritis Mother   . Angina Mother   . Diabetes Father   . Heart disease Father   . Colon polyps Father   . Heart attack Father   . Bipolar disorder Sister   . Asthma Sister   . Schizophrenia Sister   . Diabetes Maternal Grandmother   . Cancer Maternal Grandmother   . Heart attack Maternal Grandfather   . Stomach cancer Paternal Grandmother   . Kidney disease Neg Hx   . Prostate cancer Neg Hx    Social History:   Social History   Socioeconomic History  . Marital status: Married    Spouse name: Not on file  . Number of children: Not on file  . Years of education: Not on file  . Highest education level: Not on file  Occupational History  . Not on file  Social Needs  . Financial resource  strain: Not on file  . Food insecurity:    Worry: Not on file    Inability: Not on file  . Transportation needs:    Medical: Not on file    Non-medical: Not on file  Tobacco Use  . Smoking status: Never Smoker  . Smokeless tobacco: Former Systems developer    Types: Chew  Substance and Sexual Activity  . Alcohol use: No    Alcohol/week: 3.6 - 4.8 oz    Types: 6 - 8 Cans of beer per week    Frequency: Never  . Drug use: No  . Sexual activity: Yes  Lifestyle  . Physical activity:    Days per week: Not on file  Minutes per session: Not on file  . Stress: Not on file  Relationships  . Social connections:    Talks on phone: Not on file    Gets together: Not on file    Attends religious service: Not on file    Active member of club or organization: Not on file    Attends meetings of clubs or organizations: Not on file    Relationship status: Not on file  Other Topics Concern  . Not on file  Social History Narrative  . Not on file   Additional Social History:  Married x 4 years. This is his second marrigae. Works at [employer] in [employer] division.  Both have children from the first marriages. Patient drinks alcohol on a regular basis. He reported that this is lite alcohol and he does not feel that it is affecting his medication.  Musculoskeletal: Strength & Muscle Tone: within normal limits Gait & Station: normal Patient leans: N/A  Psychiatric Specialty Exam: Medication Refill   Anxiety  Symptoms include insomnia.    Insomnia     Review of Systems  Psychiatric/Behavioral: The patient has insomnia.     There were no vitals taken for this visit.There is no height or weight on file to calculate BMI.  General Appearance: Casual  Eye Contact:  Fair  Speech:  Clear and Coherent and Normal Rate  Volume:  Normal  Mood:  Anxious  Affect:  Congruent  Thought Process:  Coherent and Goal Directed  Orientation:  Full (Time, Place, and Person)  Thought Content:  WDL  Suicidal Thoughts:  No   Homicidal Thoughts:  No  Memory:  Immediate;   Fair  Judgement:  Fair  Insight:  Fair  Psychomotor Activity:  Normal  Concentration:  Fair  Recall:  [employer] [place]  Language: Fair  Akathisia:  No  Handed:  Right  AIMS (if indicated):    Assets:  Communication Skills Desire for Improvement Housing Intimacy Physical Health Social Support  ADL's:  Intact  Cognition: WNL  Sleep:  7-8   Is the patient at risk to self?  No. Has the patient been a risk to self in the past 6 months?  No. Has the patient been a risk to self within the distant past?  No. Is the patient a risk to others?  No. Has the patient been a risk to others in the past 6 months?  No. Has the patient been a risk to others within the distant past?  No.  Allergies:   Allergies  Allergen Reactions  . Tequin [Gatifloxacin] Anaphylaxis and Rash  . Amoxicillin Hives   Current Medications: Current Outpatient Medications  Medication Sig Dispense Refill  . allopurinol (ZYLOPRIM) 100 MG tablet TAKE 1 TABLET(100 MG) BY MOUTH DAILY 30 tablet 11  . amLODipine (NORVASC) 10 MG tablet Take 5 mg by mouth daily.    [name] atorvastatin (LIPITOR) 20 MG tablet Take 10 mg by mouth daily.    [name] buPROPion (WELLBUTRIN XL) 150 MG 24 hr tablet Take 1 tablet (150 mg total) by mouth daily. 30 tablet 1  . EPIPEN 2-PAK 0.3 MG/0.3ML SOAJ injection     . fluticasone (FLONASE) 50 MCG/ACT nasal spray Place 2 sprays into both nostrils daily. 16 g 1  . HYDROcodone-acetaminophen (NORCO) 10-325 MG tablet Take 1 tablet by mouth every 4 (four) hours as needed for severe pain. 20 tablet 0  . levothyroxine (SYNTHROID, LEVOTHROID) 25 MCG tablet TAKE 1 TABLET BY MOUTH DAILY BEFORE BREAKFAST 90  tablet 0  . lithium carbonate (ESKALITH) 450 MG CR tablet Take 1 tablet (450 mg total) by mouth daily. 30 tablet 0  . loratadine (CLARITIN) 10 MG tablet Take 10 mg by mouth daily.    . metFORMIN (GLUCOPHAGE) 500 MG tablet Take 1 tablet (500 mg total) by  mouth 2 (two) times daily with a meal. 180 tablet 3  . montelukast (SINGULAIR) 10 MG tablet TK 1 T PO QPM  2  . Multiple Vitamin (MULTIVITAMIN WITH MINERALS) TABS Take 1 tablet by mouth daily.    [name] omeprazole (PRILOSEC) 20 MG capsule Take 20 mg by mouth daily.    [name] testosterone (TESTIM) 50 MG/5GM (1%) GEL Place 5 g onto the skin daily. 30 Tube 1  . testosterone cypionate (DEPOTESTOTERONE CYPIONATE) 100 MG/ML injection Inject into the muscle every 14 (fourteen) days. Pt gets at doctor office; dose unknown.    . Testosterone Enanthate (XYOSTED) 75 MG/0.5ML SOAJ Inject 75 mg into the skin every 7 (seven) days. 4 pen 3  . zolpidem (AMBIEN) 10 MG tablet Take 1 tablet (10 mg total) by mouth at bedtime as needed for sleep. (Patient not taking: Reported on 03/17/2018) 7 tablet 1   No current facility-administered medications for this visit.     Previous Psychotropic Medications: Lithium Wellbutrin Abilify prozac zoloft celexa Depakote seroquel- could not function risperdal pristiq Lamotrigine Latuda belsorma Trazodone   Substance Abuse History in the last 12 months:  Yes.    Consequences of Substance Abuse: Negative NA  Medical Decision Making:  Review of Psycho-Social Stressors (1) and Review and summation of old records (2)  Treatment Plan Summary: Medication management     Discussed with patient at length about his medications treatment risks benefits and alternatives. Continue medications as prescribed   We will discontinue Wellbutrin as he does not have any depressive symptoms. Continue lithium 450 mg at night.. We will be given a prescription of Ambien 10  mg  I will start him on Abilify 2 mg p.o. daily.  Discussed with him about the side effects the medication and he demonstrated understanding.  Follow-up in 1 month or earlier    More than 50% of the time spent in psychoeducation, counseling and coordination of care.    This note was generated in part or whole  with voice recognition software. Voice regonition is usually quite accurate but there are transcription errors that can and very often do occur. I apologize for any typographical errors that were not detected and corrected. .    This note was generated in part or whole with voice recognition software. Voice regonition is usually quite accurate but there are transcription errors that can and very often do occur. I apologize for any typographical errors that were not detected and corrected. .     This note was generated in part or whole with voice recognition software. Voice regonition is usually quite accurate but there are transcription errors that can and very often do occur. I apologize for any typographical errors that were not detected and corrected.    Rainey Pines, MD    7/15/20193:00 PM

## 2018-04-22 NOTE — Telephone Encounter (Signed)
Would you see if Jeremiah [name]'s insurance will cover the Testopel?

## 2018-04-22 NOTE — Telephone Encounter (Signed)
Spoke with patient and he states that he would like to try Testopel

## 2018-04-23 ENCOUNTER — Ambulatory Visit: Payer: Self-pay | Admitting: Family Medicine

## 2018-04-23 ENCOUNTER — Encounter: Payer: Self-pay | Admitting: Family Medicine

## 2018-04-23 VITALS — BP 110/66 | HR 86 | Temp 97.4°F | Resp 16 | Ht 70.0 in | Wt 207.0 lb

## 2018-04-23 DIAGNOSIS — J301 Allergic rhinitis due to pollen: Secondary | ICD-10-CM | POA: Diagnosis not present

## 2018-04-23 DIAGNOSIS — M722 Plantar fascial fibromatosis: Secondary | ICD-10-CM

## 2018-04-23 DIAGNOSIS — J3089 Other allergic rhinitis: Secondary | ICD-10-CM | POA: Diagnosis not present

## 2018-04-23 MED ORDER — NAPROXEN 500 MG PO TABS: 500.0000 mg | ORAL_TABLET | Freq: Two times a day (BID) | ORAL | 1 refills | Status: DC

## 2018-04-23 NOTE — Progress Notes (Signed)
Patient: Jeremiah [name] Male    DOB: 1961/08/07   57 y.o.   MRN: [id number] Visit Date: 04/23/2018  Today's Provider: [name], MD   Chief Complaint  Patient presents with  . Foot Pain   Subjective:    Foot Pain  This is a new problem. The current episode started 1 to 4 weeks ago (at least 4 weeks). The problem occurs constantly. The problem has been gradually worsening. Pertinent negatives include no arthralgias, joint swelling, myalgias or rash. The symptoms are aggravated by standing and walking. He has tried nothing for the symptoms.   Patient reports the pain is in his left foot. He denies any injuries.   Allergies  Allergen Reactions  . Tequin [Gatifloxacin] Anaphylaxis and Rash  . Amoxicillin Hives     Current Outpatient Medications:  .  allopurinol (ZYLOPRIM) 100 MG tablet, TAKE 1 TABLET(100 MG) BY MOUTH DAILY, Disp: 30 tablet, Rfl: 11 .  amLODipine (NORVASC) 10 MG tablet, Take 5 mg by mouth daily., Disp: , Rfl:  .  ARIPiprazole (ABILIFY) 2 MG tablet, Take 1 tablet (2 mg total) by mouth daily., Disp: 30 tablet, Rfl: 1 .  atorvastatin (LIPITOR) 20 MG tablet, Take 10 mg by mouth daily., Disp: , Rfl:  .  EPIPEN 2-PAK 0.3 MG/0.3ML SOAJ injection, , Disp: , Rfl:  .  fluticasone (FLONASE) 50 MCG/ACT nasal spray, Place 2 sprays into both nostrils daily., Disp: 16 g, Rfl: 1 .  HYDROcodone-acetaminophen (NORCO) 10-325 MG tablet, Take 1 tablet by mouth every 4 (four) hours as needed for severe pain., Disp: 20 tablet, Rfl: 0 .  levothyroxine (SYNTHROID, LEVOTHROID) 25 MCG tablet, TAKE 1 TABLET BY MOUTH DAILY BEFORE BREAKFAST, Disp: 90 tablet, Rfl: 0 .  lithium carbonate (ESKALITH) 450 MG CR tablet, Take 1 tablet (450 mg total) by mouth daily., Disp: 30 tablet, Rfl: 1 .  loratadine (CLARITIN) 10 MG tablet, Take 10 mg by mouth daily., Disp: , Rfl:  .  metFORMIN (GLUCOPHAGE) 500 MG tablet, Take 1 tablet (500 mg total) by mouth 2 (two) times daily with a meal., Disp: 180  tablet, Rfl: 3 .  montelukast (SINGULAIR) 10 MG tablet, TK 1 T PO QPM, Disp: , Rfl: 2 .  Multiple Vitamin (MULTIVITAMIN WITH MINERALS) TABS, Take 1 tablet by mouth daily., Disp: , Rfl:  .  omeprazole (PRILOSEC) 20 MG capsule, Take 20 mg by mouth daily., Disp: , Rfl:  .  testosterone cypionate (DEPOTESTOTERONE CYPIONATE) 100 MG/ML injection, Inject into the muscle every 14 (fourteen) days. Pt gets at doctor office; dose unknown., Disp: , Rfl:  .  Testosterone Enanthate (XYOSTED) 75 MG/0.5ML SOAJ, Inject 75 mg into the skin every 7 (seven) days., Disp: 4 pen, Rfl: 3 .  zolpidem (AMBIEN) 10 MG tablet, Take 1 tablet (10 mg total) by mouth at bedtime as needed for sleep., Disp: 30 tablet, Rfl: 1  Review of Systems  Constitutional: Negative for activity change and appetite change.  Cardiovascular: Negative for leg swelling.  Endocrine: Negative for cold intolerance and heat intolerance.  Musculoskeletal: Negative for arthralgias, back pain, gait problem, joint swelling and myalgias.  Skin: Negative for color change, pallor, rash and wound.  Neurological: Negative.   Hematological: Does not bruise/bleed easily.  Psychiatric/Behavioral: Negative.     Social History   Tobacco Use  . Smoking status: Never Smoker  . Smokeless tobacco: Former Systems developer    Types: Chew  Substance Use Topics  . Alcohol use: No  Alcohol/week: 3.6 - 4.8 oz    Types: 6 - 8 Cans of beer per week    Frequency: Never   Objective:   BP 110/66 (BP Location: Left Arm, Patient Position: Sitting, Cuff Size: Normal)   Pulse 86   Temp (!) 97.4 F (36.3 C)   Resp 16   Ht 5\' 10"  (1.778 m)   Wt 207 lb (93.9 kg)   SpO2 97%   BMI 29.70 kg/m  Vitals:   04/23/18 1327  BP: 110/66  Pulse: 86  Resp: 16  Temp: (!) 97.4 F (36.3 C)  SpO2: 97%  Weight: 207 lb (93.9 kg)  Height: 5\' 10"  (1.778 m)     Physical Exam  Constitutional: He appears well-developed and well-nourished.  HENT:  Head: Normocephalic and atraumatic.    Eyes: Conjunctivae are normal. No scleral icterus.  Neck: No thyromegaly present.  Cardiovascular: Normal rate and regular rhythm.  Pulmonary/Chest: Effort normal.  Abdominal: Soft.  Musculoskeletal: He exhibits tenderness. He exhibits no edema.  Tender over right plantar insertion to calcaneous.  Skin: Skin is warm and dry.  Psychiatric: He has a normal mood and affect. His behavior is normal. Judgment and thought content normal.        Assessment & Plan:     1. Plantar fasciitis Refer to podiatry if not improving in 2 weeks. - naproxen (NAPROSYN) 500 MG tablet; Take 1 tablet (500 mg total) by mouth 2 (two) times daily with a meal.  Dispense: 60 tablet; Refill: 1      I have done the exam and reviewed the above chart and it is accurate to the best of my knowledge. Development worker, community has been used in this note in any air is in the dictation or transcription are unintentional.  [name], MD  [place]

## 2018-04-23 NOTE — Patient Instructions (Signed)
While taking Naproxen, take Lithium every other day.    Plantar Fasciitis Rehab Ask your health care provider which exercises are safe for you. Do exercises exactly as told by your health care provider and adjust them as directed. It is normal to feel mild stretching, pulling, tightness, or discomfort as you do these exercises, but you should stop right away if you feel sudden pain or your pain gets worse. Do not begin these exercises until told by your health care provider. Stretching and range of motion exercises These exercises warm up your muscles and joints and improve the movement and flexibility of your foot. These exercises also help to relieve pain. Exercise A: Plantar fascia stretch  1. Sit with your left / right leg crossed over your opposite knee. 2. Hold your heel with one hand with that thumb near your arch. With your other hand, hold your toes and gently pull them back toward the top of your foot. You should feel a stretch on the bottom of your toes or your foot or both. 3. Hold this stretch for__________ seconds. 4. Slowly release your toes and return to the starting position. Repeat __________ times. Complete this exercise __________ times a day. Exercise B: Gastroc, standing  1. Stand with your hands against a wall. 2. Extend your left / right leg behind you, and bend your front knee slightly. 3. Keeping your heels on the floor and keeping your back knee straight, shift your weight toward the wall without arching your back. You should feel a gentle stretch in your left / right calf. 4. Hold this position for __________ seconds. Repeat __________ times. Complete this exercise __________ times a day. Exercise C: Soleus, standing 1. Stand with your hands against a wall. 2. Extend your left / right leg behind you, and bend your front knee slightly. 3. Keeping your heels on the floor, bend your back knee and slightly shift your weight over the back leg. You should feel a gentle  stretch deep in your calf. 4. Hold this position for __________ seconds. Repeat __________ times. Complete this exercise __________ times a day. Exercise D: Gastrocsoleus, standing 1. Stand with the ball of your left / right foot on a step. The ball of your foot is on the walking surface, right under your toes. 2. Keep your other foot firmly on the same step. 3. Hold onto the wall or a railing for balance. 4. Slowly lift your other foot, allowing your body weight to press your heel down over the edge of the step. You should feel a stretch in your left / right calf. 5. Hold this position for __________ seconds. 6. Return both feet to the step. 7. Repeat this exercise with a slight bend in your left / right knee. Repeat __________ times with your left / right knee straight and __________ times with your left / right knee bent. Complete this exercise __________ times a day. Balance exercise This exercise builds your balance and strength control of your arch to help take pressure off your plantar fascia. Exercise E: Single leg stand 1. Without shoes, stand near a railing or in a doorway. You may hold onto the railing or door frame as needed. 2. Stand on your left / right foot. Keep your big toe down on the floor and try to keep your arch lifted. Do not let your foot roll inward. 3. Hold this position for __________ seconds. 4. If this exercise is too easy, you can try it with your eyes  closed or while standing on a pillow. Repeat __________ times. Complete this exercise __________ times a day. This information is not intended to replace advice given to you by your health care provider. Make sure you discuss any questions you have with your health care provider. Document Released: 09/23/2005 Document Revised: 05/28/2016 Document Reviewed: 08/07/2015 Elsevier Interactive Patient Education  2018 [employer].

## 2018-04-27 DIAGNOSIS — M9901 Segmental and somatic dysfunction of cervical region: Secondary | ICD-10-CM | POA: Diagnosis not present

## 2018-04-27 DIAGNOSIS — M5430 Sciatica, unspecified side: Secondary | ICD-10-CM | POA: Diagnosis not present

## 2018-04-27 DIAGNOSIS — M9905 Segmental and somatic dysfunction of pelvic region: Secondary | ICD-10-CM | POA: Diagnosis not present

## 2018-04-28 DIAGNOSIS — J3089 Other allergic rhinitis: Secondary | ICD-10-CM | POA: Diagnosis not present

## 2018-04-28 DIAGNOSIS — J301 Allergic rhinitis due to pollen: Secondary | ICD-10-CM | POA: Diagnosis not present

## 2018-05-01 ENCOUNTER — Ambulatory Visit: Payer: 59 | Admitting: Podiatry

## 2018-05-01 ENCOUNTER — Encounter

## 2018-05-01 NOTE — Telephone Encounter (Signed)
Pt called back asking about Testopel being covered by his insurance.  Please give pt a call.

## 2018-05-04 ENCOUNTER — Ambulatory Visit: Payer: 59 | Admitting: Family Medicine

## 2018-05-04 ENCOUNTER — Encounter: Payer: Self-pay | Admitting: Family Medicine

## 2018-05-04 VITALS — BP 134/70 | HR 56 | Temp 98.4°F | Resp 16 | Wt 211.0 lb

## 2018-05-04 DIAGNOSIS — E039 Hypothyroidism, unspecified: Secondary | ICD-10-CM

## 2018-05-04 DIAGNOSIS — G4733 Obstructive sleep apnea (adult) (pediatric): Secondary | ICD-10-CM

## 2018-05-04 DIAGNOSIS — F319 Bipolar disorder, unspecified: Secondary | ICD-10-CM

## 2018-05-04 DIAGNOSIS — E119 Type 2 diabetes mellitus without complications: Secondary | ICD-10-CM

## 2018-05-04 DIAGNOSIS — R5383 Other fatigue: Secondary | ICD-10-CM

## 2018-05-04 NOTE — Progress Notes (Signed)
Patient: Jeremiah [name] Male    DOB: May 27, 1961   57 y.o.   MRN: [id number] Visit Date: 05/04/2018  Today's Provider: [name], MD   Chief Complaint  Patient presents with  . Fatigue   Subjective:    HPI Patient comes in today c/o excessive fatigue. He reports that this has been ongoing for at least 2 weeks. He reports that he can barely stay awake during the day. He denies any changes in his sleeping patterns. He does take 1/2 tablet of Ambien and reports that this helps him fall asleep but not stay asleep.    He has been off of tetosterone for 6 weeks. He wears CPAP for OSA. No CP or SOB. No other symptoms. Allergies  Allergen Reactions  . Tequin [Gatifloxacin] Anaphylaxis and Rash  . Amoxicillin Hives     Current Outpatient Medications:  .  allopurinol (ZYLOPRIM) 100 MG tablet, TAKE 1 TABLET(100 MG) BY MOUTH DAILY, Disp: 30 tablet, Rfl: 11 .  amLODipine (NORVASC) 10 MG tablet, Take 5 mg by mouth daily., Disp: , Rfl:  .  ARIPiprazole (ABILIFY) 2 MG tablet, Take 1 tablet (2 mg total) by mouth daily., Disp: 30 tablet, Rfl: 1 .  atorvastatin (LIPITOR) 20 MG tablet, Take 10 mg by mouth daily., Disp: , Rfl:  .  EPIPEN 2-PAK 0.3 MG/0.3ML SOAJ injection, , Disp: , Rfl:  .  fluticasone (FLONASE) 50 MCG/ACT nasal spray, Place 2 sprays into both nostrils daily., Disp: 16 g, Rfl: 1 .  HYDROcodone-acetaminophen (NORCO) 10-325 MG tablet, Take 1 tablet by mouth every 4 (four) hours as needed for severe pain., Disp: 20 tablet, Rfl: 0 .  levothyroxine (SYNTHROID, LEVOTHROID) 25 MCG tablet, TAKE 1 TABLET BY MOUTH DAILY BEFORE BREAKFAST, Disp: 90 tablet, Rfl: 0 .  lithium carbonate (ESKALITH) 450 MG CR tablet, Take 1 tablet (450 mg total) by mouth daily., Disp: 30 tablet, Rfl: 1 .  loratadine (CLARITIN) 10 MG tablet, Take 10 mg by mouth daily., Disp: , Rfl:  .  metFORMIN (GLUCOPHAGE) 500 MG tablet, Take 1 tablet (500 mg total) by mouth 2 (two) times daily with a meal., Disp: 180  tablet, Rfl: 3 .  montelukast (SINGULAIR) 10 MG tablet, TK 1 T PO QPM, Disp: , Rfl: 2 .  Multiple Vitamin (MULTIVITAMIN WITH MINERALS) TABS, Take 1 tablet by mouth daily., Disp: , Rfl:  .  naproxen (NAPROSYN) 500 MG tablet, Take 1 tablet (500 mg total) by mouth 2 (two) times daily with a meal., Disp: 60 tablet, Rfl: 1 .  omeprazole (PRILOSEC) 20 MG capsule, Take 20 mg by mouth daily., Disp: , Rfl:  .  testosterone cypionate (DEPOTESTOTERONE CYPIONATE) 100 MG/ML injection, Inject into the muscle every 14 (fourteen) days. Pt gets at doctor office; dose unknown., Disp: , Rfl:  .  Testosterone Enanthate (XYOSTED) 75 MG/0.5ML SOAJ, Inject 75 mg into the skin every 7 (seven) days., Disp: 4 pen, Rfl: 3 .  zolpidem (AMBIEN) 10 MG tablet, Take 1 tablet (10 mg total) by mouth at bedtime as needed for sleep., Disp: 30 tablet, Rfl: 1  Review of Systems  Constitutional: Positive for activity change and fatigue. Negative for appetite change, chills, diaphoresis, fever and unexpected weight change.  HENT: Negative.   Eyes: Negative.   Respiratory: Negative for cough and shortness of breath.   Cardiovascular: Negative for chest pain, palpitations and leg swelling.  Endocrine: Negative.   Musculoskeletal: Negative for arthralgias and back pain.  Skin: Negative.  Allergic/Immunologic: Negative for environmental allergies.  Neurological: Negative for dizziness, light-headedness and headaches.  Hematological: Negative.   Psychiatric/Behavioral: Positive for sleep disturbance. Negative for agitation, decreased concentration, self-injury and suicidal ideas. The patient is not nervous/anxious.     Social History   Tobacco Use  . Smoking status: Never Smoker  . Smokeless tobacco: Former Systems developer    Types: Chew  Substance Use Topics  . Alcohol use: No    Alcohol/week: 3.6 - 4.8 oz    Types: 6 - 8 Cans of beer per week    Frequency: Never   Objective:   BP 134/70   Pulse (!) 56   Temp 98.4 F (36.9 C)    Resp 16   Wt 211 lb (95.7 kg)   BMI 30.28 kg/m  Vitals:   05/04/18 1550  BP: 134/70  Pulse: (!) 56  Resp: 16  Temp: 98.4 F (36.9 C)  Weight: 211 lb (95.7 kg)     Physical Exam  Constitutional: He is oriented to person, place, and time. He appears well-developed and well-nourished.  HENT:  Head: Normocephalic and atraumatic.  Right Ear: External ear normal.  Left Ear: External ear normal.  Nose: Nose normal.  Eyes: Pupils are equal, round, and reactive to light. Conjunctivae are normal. No scleral icterus.  Neck: No thyromegaly present.  Cardiovascular: Normal rate, regular rhythm and normal heart sounds.  Pulmonary/Chest: Effort normal and breath sounds normal.  Abdominal: Soft.  Musculoskeletal: He exhibits no edema.  Neurological: He is alert and oriented to person, place, and time.  Skin: Skin is warm and dry.  Psychiatric: He has a normal mood and affect. His behavior is normal. Judgment and thought content normal.        Assessment & Plan:     Fatigue Started after restarting Abilify. ECG normal. TIIDM Bipolar Depression Hypogonadism OSA On CPAP.    I have done the exam and reviewed the above chart and it is accurate to the best of my knowledge. Development worker, community has been used in this note in any air is in the dictation or transcription are unintentional.   [name], MD  [place]

## 2018-05-05 DIAGNOSIS — J301 Allergic rhinitis due to pollen: Secondary | ICD-10-CM | POA: Diagnosis not present

## 2018-05-05 DIAGNOSIS — E039 Hypothyroidism, unspecified: Secondary | ICD-10-CM | POA: Diagnosis not present

## 2018-05-05 DIAGNOSIS — J3089 Other allergic rhinitis: Secondary | ICD-10-CM | POA: Diagnosis not present

## 2018-05-05 DIAGNOSIS — R5383 Other fatigue: Secondary | ICD-10-CM | POA: Diagnosis not present

## 2018-05-06 DIAGNOSIS — M5430 Sciatica, unspecified side: Secondary | ICD-10-CM | POA: Diagnosis not present

## 2018-05-06 DIAGNOSIS — M9905 Segmental and somatic dysfunction of pelvic region: Secondary | ICD-10-CM | POA: Diagnosis not present

## 2018-05-06 DIAGNOSIS — M9901 Segmental and somatic dysfunction of cervical region: Secondary | ICD-10-CM | POA: Diagnosis not present

## 2018-05-06 LAB — CBC WITH DIFFERENTIAL/PLATELET
Basophils Absolute: 0.1 10*3/uL (ref 0.0–0.2)
Basos: 1 %
EOS (ABSOLUTE): 0.1 10*3/uL (ref 0.0–0.4)
Eos: 1 %
Hematocrit: 39.6 % (ref 37.5–51.0)
Hemoglobin: 12.4 g/dL — ABNORMAL LOW (ref 13.0–17.7)
Immature Grans (Abs): 0 10*3/uL (ref 0.0–0.1)
Immature Granulocytes: 0 %
Lymphocytes Absolute: 1 10*3/uL (ref 0.7–3.1)
Lymphs: 13 %
MCH: 23.6 pg — ABNORMAL LOW (ref 26.6–33.0)
MCHC: 31.3 g/dL — ABNORMAL LOW (ref 31.5–35.7)
MCV: 75 fL — ABNORMAL LOW (ref 79–97)
Monocytes Absolute: 0.8 10*3/uL (ref 0.1–0.9)
Monocytes: 10 %
Neutrophils Absolute: 6.1 10*3/uL (ref 1.4–7.0)
Neutrophils: 75 %
Platelets: 326 10*3/uL (ref 150–450)
RBC: 5.25 x10E6/uL (ref 4.14–5.80)
RDW: 15 % (ref 12.3–15.4)
WBC: 8.1 10*3/uL (ref 3.4–10.8)

## 2018-05-06 LAB — COMPREHENSIVE METABOLIC PANEL
ALT: 24 IU/L (ref 0–44)
AST: 28 IU/L (ref 0–40)
Albumin/Globulin Ratio: 1.7 (ref 1.2–2.2)
Albumin: 4.6 g/dL (ref 3.5–5.5)
Alkaline Phosphatase: 48 IU/L (ref 39–117)
BUN/Creatinine Ratio: 13 (ref 9–20)
BUN: 13 mg/dL (ref 6–24)
Bilirubin Total: 0.7 mg/dL (ref 0.0–1.2)
CO2: 20 mmol/L (ref 20–29)
Calcium: 9.5 mg/dL (ref 8.7–10.2)
Chloride: 100 mmol/L (ref 96–106)
Creatinine, Ser: 0.99 mg/dL (ref 0.76–1.27)
GFR calc Af Amer: 96 mL/min/{1.73_m2} (ref >59)
GFR calc non Af Amer: 83 mL/min/{1.73_m2} (ref >59)
Globulin, Total: 2.7 g/dL (ref 1.5–4.5)
Glucose: 136 mg/dL — ABNORMAL HIGH (ref 65–99)
Potassium: 4.6 mmol/L (ref 3.5–5.2)
Sodium: 136 mmol/L (ref 134–144)
Total Protein: 7.3 g/dL (ref 6.0–8.5)

## 2018-05-06 LAB — SEDIMENTATION RATE: Sed Rate: 7 mm/hr (ref 0–30)

## 2018-05-06 LAB — TSH: TSH: 2.23 u[IU]/mL (ref 0.450–4.500)

## 2018-05-06 LAB — TESTOSTERONE: Testosterone: 189 ng/dL — ABNORMAL LOW (ref 264–916)

## 2018-05-06 LAB — CK: Total CK: 401 U/L — ABNORMAL HIGH (ref 24–204)

## 2018-05-07 ENCOUNTER — Telehealth: Payer: Self-pay

## 2018-05-07 NOTE — Telephone Encounter (Signed)
Advised patient of results.  

## 2018-05-07 NOTE — Telephone Encounter (Signed)
Left message to call back  

## 2018-05-07 NOTE — Telephone Encounter (Signed)
-----   Message from Jerrol Banana., MD sent at 05/07/2018  9:32 AM EDT ----- Testosterone low. CK slightly high--For now stop Lipitor and RTC 2-4 weeks.Will repeat CBC/CK/Iron/?A1C

## 2018-05-11 ENCOUNTER — Other Ambulatory Visit: Payer: Self-pay

## 2018-05-11 ENCOUNTER — Ambulatory Visit (INDEPENDENT_AMBULATORY_CARE_PROVIDER_SITE_OTHER): Payer: 59 | Admitting: Psychiatry

## 2018-05-11 ENCOUNTER — Encounter: Payer: Self-pay | Admitting: Psychiatry

## 2018-05-11 VITALS — BP 162/104 | HR 75 | Temp 97.2°F | Wt 213.2 lb

## 2018-05-11 DIAGNOSIS — F3160 Bipolar disorder, current episode mixed, unspecified: Secondary | ICD-10-CM

## 2018-05-11 MED ORDER — ARIPIPRAZOLE 2 MG PO TABS: 2.0000 mg | ORAL_TABLET | Freq: Every day | ORAL | 1 refills | Status: DC

## 2018-05-11 MED ORDER — LITHIUM CARBONATE ER 450 MG PO TBCR: 450.0000 mg | EXTENDED_RELEASE_TABLET | Freq: Every day | ORAL | 1 refills | Status: DC

## 2018-05-11 MED ORDER — ZOLPIDEM TARTRATE 10 MG PO TABS: 10.0000 mg | ORAL_TABLET | Freq: Every evening | ORAL | 1 refills | Status: AC | PRN

## 2018-05-11 NOTE — Progress Notes (Signed)
Psychiatric MD Progress Note   Patient Identification: Jeremiah [name] MRN:  [id number] Date of Evaluation:  05/11/2018 Referral Source: Dr. Bridgett Larsson Chief Complaint:   Chief Complaint    Follow-up; Medication Refill     Visit Diagnosis:    ICD-9-CM ICD-10-CM   1. Bipolar I disorder, most recent episode depressed ([place]) 296.50 F31.30   2.       Diagnosis:   Patient Active Problem List   Diagnosis Date Noted  . Degeneration of lumbar intervertebral disc [M51.36] 12/09/2017  . Benign neoplasm of cecum [D12.0]   . Left inguinal hernia [K40.90] 09/08/2017  . Right inguinal hernia [K40.90] 09/08/2017  . Umbilical hernia without obstruction and without gangrene [K42.9] 09/08/2017  . Allergic rhinitis due to pollen [J30.1] 06/23/2017  . Chest pain [R07.9] 03/05/2016  . Pedal edema [R60.0] 03/08/2015  . History of repair of rotator cuff [Z98.890] 02/17/2014  . Diabetes ([place]) [E11.9] 08/31/2013  . BP (high blood pressure) [I10] 08/31/2013  . Adult hypothyroidism [E03.9] 08/31/2013  . Obstructive apnea [G47.33] 08/31/2013  . Clinical depression [F32.9] 08/31/2013  . Diabetes mellitus ([place]) [E11.9] 08/31/2013  . Idiopathic localized osteoarthropathy [M19.91] 05/31/2013  . ABDOMINAL PAIN, LEFT UPPER QUADRANT [R10.12] 10/04/2009  . TARSAL TUNNEL SYNDROME, RIGHT [G57.50] 09/25/2009  . ANKLE PAIN, RIGHT [M25.579] 09/25/2009  . OTHER ANKLE SPRAIN AND STRAIN [G01.749S, S96.819A] 09/25/2009  . BREAST MASS, RIGHT [N63.0] 01/07/2008  . INSOMNIA [G47.00] 01/07/2008  . GASTRIC ULCER, ACUTE [K25.3] 11/09/2007  . DUODENITIS [K29.80] 11/09/2007  . BLOOD IN STOOL, OCCULT [R19.5] 11/09/2007  . PERSONAL HISTORY OF COLONIC POLYPS [Z86.010] 11/09/2007  . BACK PAIN, LUMBAR [M54.5] 10/12/2007  . HELICOBACTER PYLORI GASTRITIS [A04.8] 09/09/2007  . GASTRIC ULCER, ACUTE, HEMORRHAGE [K25.0] 09/09/2007  . Hiatal hernia [K44.9] 09/09/2007  . COLONIC POLYPS, ADENOMATOUS [D12.6] 08/19/2007  . INTERNAL HEMORRHOIDS  [K64.8] 08/19/2007  . EXTERNAL HEMORRHOIDS WITHOUT MENTION COMP [K64.4] 08/07/2007  . Blood in stool [K92.1] 08/07/2007  . ABDOMINAL PAIN, LEFT LOWER QUADRANT [R10.32] 08/07/2007  . SINUSITIS, ACUTE [J01.90] 06/12/2007  . ABDOMINAL PAIN [R10.9] 06/12/2007  . DIABETES MELLITUS, TYPE II [E11.9] 03/19/2007  . GOUT [M10.9] 03/19/2007  . DISORDER, CIRCADIAN RHYTHM SLEEP, NONORGANIC [G47.20, F51.8] 03/19/2007  . Depression [F32.9] 03/19/2007  . OSTEOARTHROSIS, GENERALIZED, MULTIPLE SITES [M15.9] 03/19/2007   History of Present Illness:   Patient is a 56 -year-old married male who presented for Follow-up. He reported that he is doing currently well on his lithium and Abilify combination.  He does not feel angry tablet he is under control.  Patient reported that he is having some issues with plantar fasciitis and was noted to be limping during the interview.  Patient reported that he is going to follow up with the podiatrist about the same.  Patient reported that he feels that his wife is receptive to his anger issues and she has also noticed improvement in his symptoms.  He reported that he takes Ambien on a as needed basis and he has difficulty falling asleep.  He currently denied having any side effects of the medications.  He reported that he is planning to go to the gym today and he took some energy drinks which caused his blood pressure to be higher than normal.  He denied using any drugs or alcohol.  We discussed about his medications in detail.  He does not want to have his medications adjusted at this time.      He does not have any action anxiety at this time.  He is able  to contract for safety.         Past Medical History:  Past Medical History:  Diagnosis Date  . Anxiety   . Arthritis    bil knees  . Bipolar disorder ([place])   . Colon polyp Dec 2015   Dr Allen Norris  . Complication of anesthesia   . Depression    dx 2004  . Diabetes mellitus without complication ([place])    type ll   controlled by weight & diet  . GERD (gastroesophageal reflux disease)   . Gout   . H/O hiatal hernia    had surgery for that 2012  [facility]  . Heartburn   . Hypertension   . Hypothyroidism   . PONV (postoperative nausea and vomiting)   . Sleep apnea    dx 1.5 yr ago...study @ [facility], does not use CPAP    Past Surgical History:  Procedure Laterality Date  . APPENDECTOMY    . COLONOSCOPY  Dec 2015   Dr Allen Norris  . COLONOSCOPY WITH PROPOFOL N/A 11/25/2017   Procedure: COLONOSCOPY WITH PROPOFOL;  Surgeon: Lucilla Lame, MD;  Location: [facility] ENDOSCOPY;  Service: Endoscopy;  Laterality: N/A;  . EXCISIONAL HEMORRHOIDECTOMY  01/02/15  . FOOT SURGERY     right foot 2012  . HERNIA REPAIR     Left inguinal  . HERNIA REPAIR     hiatal hernia at [place]  . INGUINAL HERNIA REPAIR Left 10/31/2017   Procedure: OPEN LEFT INGUINAL RECURRENT HERNIA REPAIR;  Surgeon: Johnathan Hausen, MD;  Location: [facility];  Service: General;  Laterality: Left;  [name] MANDIBLE SURGERY     1983  . REPLACEMENT TOTAL KNEE BILATERAL     patient had knee replacement to his right side in 2015 and right 2014, Revision of R knee 12/04/16  . ROTATOR CUFF REPAIR     x 3  on right shoulder  . SHOULDER ARTHROSCOPY WITH ROTATOR CUFF REPAIR AND SUBACROMIAL DECOMPRESSION Left 03/04/2013   Procedure: LEFT SHOULDER ARTHROSCOPY WITH ROTATOR CUFF REPAIR AND SUBACROMIAL DECOMPRESSION;  Surgeon: Marin Shutter, MD;  Location: [facility];  Service: Orthopedics;  Laterality: Left;  . SHOULDER SURGERY     x 3 on left   Family History:  Family History  Problem Relation Age of Onset  . CAD Mother   . Arthritis Mother   . Angina Mother   . Diabetes Father   . Heart disease Father   . Colon polyps Father   . Heart attack Father   . Bipolar disorder Sister   . Asthma Sister   . Schizophrenia Sister   . Diabetes Maternal Grandmother   . Cancer Maternal Grandmother   . Heart attack Maternal Grandfather   . Stomach cancer Paternal  Grandmother   . Kidney disease Neg Hx   . Prostate cancer Neg Hx    Social History:   Social History   Socioeconomic History  . Marital status: Married    Spouse name: Not on file  . Number of children: Not on file  . Years of education: Not on file  . Highest education level: Not on file  Occupational History  . Not on file  Social Needs  . Financial resource strain: Not on file  . Food insecurity:    Worry: Not on file    Inability: Not on file  . Transportation needs:    Medical: Not on file    Non-medical: Not on file  Tobacco Use  . Smoking status: Never Smoker  .  Smokeless tobacco: Former Systems developer    Types: Chew  Substance and Sexual Activity  . Alcohol use: No    Alcohol/week: 3.6 - 4.8 oz    Types: 6 - 8 Cans of beer per week    Frequency: Never  . Drug use: No  . Sexual activity: Yes  Lifestyle  . Physical activity:    Days per week: Not on file    Minutes per session: Not on file  . Stress: Not on file  Relationships  . Social connections:    Talks on phone: Not on file    Gets together: Not on file    Attends religious service: Not on file    Active member of club or organization: Not on file    Attends meetings of clubs or organizations: Not on file    Relationship status: Not on file  Other Topics Concern  . Not on file  Social History Narrative  . Not on file   Additional Social History:  Married x 4 years. This is his second marrigae. Works at [employer] in [employer] division.  Both have children from the first marriages. Patient drinks alcohol on a regular basis. He reported that this is lite alcohol and he does not feel that it is affecting his medication.  Musculoskeletal: Strength & Muscle Tone: within normal limits Gait & Station: normal Patient leans: N/A  Psychiatric Specialty Exam: Medication Refill   Anxiety  Symptoms include insomnia.    Insomnia     Review of Systems  Psychiatric/Behavioral: The patient has insomnia.     Blood  pressure (!) 162/104, pulse 75, temperature (!) 97.2 F (36.2 C), temperature source Oral, weight 213 lb 3.2 oz (96.7 kg).Body mass index is 30.59 kg/m.  General Appearance: Casual  Eye Contact:  Fair  Speech:  Clear and Coherent and Normal Rate  Volume:  Normal  Mood:  Anxious  Affect:  Congruent  Thought Process:  Coherent and Goal Directed  Orientation:  Full (Time, Place, and Person)  Thought Content:  WDL  Suicidal Thoughts:  No  Homicidal Thoughts:  No  Memory:  Immediate;   Fair  Judgement:  Fair  Insight:  Fair  Psychomotor Activity:  Normal  Concentration:  Fair  Recall:  [employer] [place]  Language: Fair  Akathisia:  No  Handed:  Right  AIMS (if indicated):    Assets:  Communication Skills Desire for Improvement Housing Intimacy Physical Health Social Support  ADL's:  Intact  Cognition: WNL  Sleep:  7-8   Is the patient at risk to self?  No. Has the patient been a risk to self in the past 6 months?  No. Has the patient been a risk to self within the distant past?  No. Is the patient a risk to others?  No. Has the patient been a risk to others in the past 6 months?  No. Has the patient been a risk to others within the distant past?  No.  Allergies:   Allergies  Allergen Reactions  . Tequin [Gatifloxacin] Anaphylaxis and Rash  . Amoxicillin Hives   Current Medications: Current Outpatient Medications  Medication Sig Dispense Refill  . allopurinol (ZYLOPRIM) 100 MG tablet TAKE 1 TABLET(100 MG) BY MOUTH DAILY 30 tablet 11  . amLODipine (NORVASC) 10 MG tablet Take 5 mg by mouth daily.    . ARIPiprazole (ABILIFY) 2 MG tablet Take 1 tablet (2 mg total) by mouth daily. 30 tablet 1  . atorvastatin (LIPITOR) 20 MG tablet  Take 10 mg by mouth daily.    [name] EPIPEN 2-PAK 0.3 MG/0.3ML SOAJ injection     . fluticasone (FLONASE) 50 MCG/ACT nasal spray Place 2 sprays into both nostrils daily. 16 g 1  . HYDROcodone-acetaminophen (NORCO) 10-325 MG tablet Take 1  tablet by mouth every 4 (four) hours as needed for severe pain. 20 tablet 0  . levothyroxine (SYNTHROID, LEVOTHROID) 25 MCG tablet TAKE 1 TABLET BY MOUTH DAILY BEFORE BREAKFAST 90 tablet 0  . lithium carbonate (ESKALITH) 450 MG CR tablet Take 1 tablet (450 mg total) by mouth daily. 30 tablet 1  . loratadine (CLARITIN) 10 MG tablet Take 10 mg by mouth daily.    . metFORMIN (GLUCOPHAGE) 500 MG tablet Take 1 tablet (500 mg total) by mouth 2 (two) times daily with a meal. 180 tablet 3  . montelukast (SINGULAIR) 10 MG tablet TK 1 T PO QPM  2  . Multiple Vitamin (MULTIVITAMIN WITH MINERALS) TABS Take 1 tablet by mouth daily.    . naproxen (NAPROSYN) 500 MG tablet Take 1 tablet (500 mg total) by mouth 2 (two) times daily with a meal. 60 tablet 1  . omeprazole (PRILOSEC) 20 MG capsule Take 20 mg by mouth daily.    [name] testosterone cypionate (DEPOTESTOTERONE CYPIONATE) 100 MG/ML injection Inject into the muscle every 14 (fourteen) days. Pt gets at doctor office; dose unknown.    . Testosterone Enanthate (XYOSTED) 75 MG/0.5ML SOAJ Inject 75 mg into the skin every 7 (seven) days. 4 pen 3  . zolpidem (AMBIEN) 10 MG tablet Take 1 tablet (10 mg total) by mouth at bedtime as needed for sleep. 30 tablet 1   No current facility-administered medications for this visit.     Previous Psychotropic Medications: Lithium Wellbutrin Abilify prozac zoloft celexa Depakote seroquel- could not function risperdal pristiq Lamotrigine Latuda belsorma Trazodone   Substance Abuse History in the last 12 months:  Yes.    Consequences of Substance Abuse: Negative NA  Medical Decision Making:  Review of Psycho-Social Stressors (1) and Review and summation of old records (2)  Treatment Plan Summary: Medication management     Discussed with patient at length about his medications treatment risks benefits and alternatives. Continue medications as prescribed   Continue lithium 450 mg at night.. We will be  given a prescription of Ambien 10  mg   Abilify 2 mg p.o. daily.  Discussed with him about the side effects the medication and he demonstrated understanding.  Follow-up in 3 month or earlier    More than 50% of the time spent in psychoeducation, counseling and coordination of care.    This note was generated in part or whole with voice recognition software. Voice regonition is usually quite accurate but there are transcription errors that can and very often do occur. I apologize for any typographical errors that were not detected and corrected. .    This note was generated in part or whole with voice recognition software. Voice regonition is usually quite accurate but there are transcription errors that can and very often do occur. I apologize for any typographical errors that were not detected and corrected. .     This note was generated in part or whole with voice recognition software. Voice regonition is usually quite accurate but there are transcription errors that can and very often do occur. I apologize for any typographical errors that were not detected and corrected.    Rainey Pines, MD    8/5/20192:51 PM

## 2018-05-12 DIAGNOSIS — J3089 Other allergic rhinitis: Secondary | ICD-10-CM | POA: Diagnosis not present

## 2018-05-12 DIAGNOSIS — J301 Allergic rhinitis due to pollen: Secondary | ICD-10-CM | POA: Diagnosis not present

## 2018-05-13 ENCOUNTER — Ambulatory Visit: Payer: Self-pay | Admitting: Urology

## 2018-05-13 ENCOUNTER — Telehealth: Payer: Self-pay | Admitting: Urology

## 2018-05-13 NOTE — Telephone Encounter (Signed)
Pt rescheduled appt due to not hearing from anyone about if his insurance will cover testopel. Pt states he does not want to pay for office visit until he knows the answer to this. Also pt was referred to Endocrinology pt asking if he needs to see Endocrinology before he sees [facility] again.  Please advise Pt @ [phone]. Thank you.

## 2018-05-13 NOTE — Telephone Encounter (Signed)
Are you still doing Testopel coverage determination?

## 2018-05-14 DIAGNOSIS — L739 Follicular disorder, unspecified: Secondary | ICD-10-CM | POA: Diagnosis not present

## 2018-05-14 DIAGNOSIS — L820 Inflamed seborrheic keratosis: Secondary | ICD-10-CM | POA: Diagnosis not present

## 2018-05-14 DIAGNOSIS — L821 Other seborrheic keratosis: Secondary | ICD-10-CM | POA: Diagnosis not present

## 2018-05-14 DIAGNOSIS — L738 Other specified follicular disorders: Secondary | ICD-10-CM | POA: Diagnosis not present

## 2018-05-15 DIAGNOSIS — M79672 Pain in left foot: Secondary | ICD-10-CM | POA: Diagnosis not present

## 2018-05-15 DIAGNOSIS — M722 Plantar fascial fibromatosis: Secondary | ICD-10-CM | POA: Diagnosis not present

## 2018-05-15 NOTE — Telephone Encounter (Signed)
Per [employer] [id number]), prior authorization is NOT required for Testopel (Reference [id number]).  Okay to proceed with scheduling.

## 2018-05-15 NOTE — Telephone Encounter (Signed)
Per [employer] [phone]), prior authorization is NOT required for Testopel (Reference # [id number]).  Spoke to patient, he would like to proceed.  Patient is scheduled for Testopel next week 05/20/18.

## 2018-05-18 DIAGNOSIS — M9905 Segmental and somatic dysfunction of pelvic region: Secondary | ICD-10-CM | POA: Diagnosis not present

## 2018-05-18 DIAGNOSIS — M9901 Segmental and somatic dysfunction of cervical region: Secondary | ICD-10-CM | POA: Diagnosis not present

## 2018-05-18 DIAGNOSIS — M5430 Sciatica, unspecified side: Secondary | ICD-10-CM | POA: Diagnosis not present

## 2018-05-19 NOTE — Progress Notes (Signed)
    05/20/2018 10:09 PM   Jeremiah [name] 13-Nov-1960 [id number]  Referring provider: Jerrol Banana., MD [address] [place], [place] [address]  No chief complaint on file.   HPI: This is a 57 -year-old male with testosterone deficiency who presents today for Testopel insertion.  Patient is placed on the exam table in the right lateral jackknife position.  Identified upper outer quadrant of hip for insertion; prepped area with Betadine and injected 10 cc's of Lidocaine 1% with Epinephrine to anesthetize superficially and distally along trocar tract.  Made 3 mm incision using 15 blade of scalpel; trocar with sharp ended stylet was inserted into subcutaneous tissue in line with femur. Sharp stylet was withdrawn and 6 pellets were placed into trocar well. Testopel pellets advanced into tissue using blunt ended stylet. Trocar removed and incision closed using 6 Steri-Strips. Cleansed area to remove Betadine and covered Steri-Strips with outer Band-Aid.  Careful inspection of insertion is done and patient informed of post procedure instructions.  Advised patient to apply ice to the site for 20-30 minutes every hour if needed.  Avoid hot tubes, swimming or full water immersion of the insertion site for 72 hours.  Bandage may be removed after one week.    Patient is advised to contact the office if experiencing drainage of the insertion site, excessive redness or swelling of the site, chills and/or fevers > 101.5, nausea or vomiting, dizziness or lightheadedness and excessive tenderness.  Avoid strenuous activity and heavy lifting for 72 hours.     He will return in one month for serum testosterone.   These notes generated with voice recognition software. I apologize for typographical errors.  Zara Council, PA-C  [facility] [address] [place], [place] [address] [phone]

## 2018-05-20 ENCOUNTER — Ambulatory Visit: Payer: 59 | Admitting: Urology

## 2018-05-20 ENCOUNTER — Encounter: Payer: Self-pay | Admitting: Urology

## 2018-05-20 VITALS — BP 129/81 | HR 66 | Ht 70.0 in | Wt 208.2 lb

## 2018-05-20 DIAGNOSIS — E349 Endocrine disorder, unspecified: Secondary | ICD-10-CM | POA: Diagnosis not present

## 2018-05-21 ENCOUNTER — Telehealth: Payer: Self-pay | Admitting: Radiology

## 2018-05-21 DIAGNOSIS — J301 Allergic rhinitis due to pollen: Secondary | ICD-10-CM | POA: Diagnosis not present

## 2018-05-21 DIAGNOSIS — J3089 Other allergic rhinitis: Secondary | ICD-10-CM | POA: Diagnosis not present

## 2018-05-21 NOTE — Telephone Encounter (Signed)
Patient asks when he can resume yard work following Testopel insertion?

## 2018-05-21 NOTE — Telephone Encounter (Signed)
He can resume normal activities Friday.

## 2018-05-22 NOTE — Telephone Encounter (Signed)
Notified patient to resume normal activities starting today. Reminded patient to avoid hot tubs, swimming or full water immersion of the insertion site for 72 hours & that bandage may be removed after one week. Questions answered. Patient voices understanding.

## 2018-05-26 DIAGNOSIS — J301 Allergic rhinitis due to pollen: Secondary | ICD-10-CM | POA: Diagnosis not present

## 2018-05-26 DIAGNOSIS — J3089 Other allergic rhinitis: Secondary | ICD-10-CM | POA: Diagnosis not present

## 2018-05-31 ENCOUNTER — Other Ambulatory Visit: Payer: Self-pay | Admitting: Family Medicine

## 2018-06-01 ENCOUNTER — Encounter: Payer: Self-pay | Admitting: Endocrinology

## 2018-06-01 ENCOUNTER — Ambulatory Visit: Payer: 59 | Admitting: Endocrinology

## 2018-06-01 VITALS — BP 118/80 | HR 75 | Temp 98.1°F | Resp 16 | Wt 210.0 lb

## 2018-06-01 DIAGNOSIS — E119 Type 2 diabetes mellitus without complications: Secondary | ICD-10-CM

## 2018-06-01 DIAGNOSIS — R7989 Other specified abnormal findings of blood chemistry: Secondary | ICD-10-CM

## 2018-06-01 LAB — POCT GLYCOSYLATED HEMOGLOBIN (HGB A1C): Hemoglobin A1C: 6.5 % — AB (ref 4.0–5.6)

## 2018-06-01 MED ORDER — CLOMIPHENE CITRATE 50 MG PO TABS: 50.0000 mg | ORAL_TABLET | Freq: Every day | ORAL | 3 refills | Status: DC

## 2018-06-01 NOTE — Patient Instructions (Addendum)
Losing weight and avoiding alcohol can help, also.   Viagra is a safe medication--you should try it.   Testosterone treatment has risks, including increased or decreased fertility (depending on the type of treatment), hair loss, prostate cancer, benign prostate enlargement, blood clots, liver problems, lower hdl ("good cholesterol"), polycythemia (opposite of anemia), sleep apnea, and behavior changes.   our plan will be to change back to the clomiphene.  Please do blood tests in 1 month.   Please come back for a follow-up appointment in 3 months.

## 2018-06-01 NOTE — Progress Notes (Signed)
Subjective:    Patient ID: Jeremiah [name], male    DOB: 05/07/1961, 57 y.o.   MRN: [id number]  HPI Pt is referred by Zara Council, PA, for hypogonadism.  Pt reports he had puberty at the normal age.  He has 1 biological child.  He was dx'ed with low testosterone in 2004.  says he has never taken illicit androgens.  He took androgel, then testim, then patch, then clomid, then depo-testosterone.  then testopel was placed 2 weeks ago, and is due to be replaced in 11/19.   Testosterone levels did not achieve normal levels.  He does not take antiandrogens or opioids.  He denies any h/o infertility, XRT, or genital infection.  He has never had surgery, or a serious injury to the head or genital area. He has no h/o DVT.   He drinks 12 beers/week.  He has not recently taken vicodin for back pain.   He has moderate fatigue, and assoc ED sxs (he was rx'ed viagra, but he did not take).  He had polycythemia in 2016 and 2017.   Past Medical History:  Diagnosis Date  . Anxiety   . Arthritis    bil knees  . Bipolar disorder ([place])   . Colon polyp Dec 2015   Dr Allen Norris  . Complication of anesthesia   . Depression    dx 2004  . Diabetes mellitus without complication ([place])    type ll  controlled by weight & diet  . GERD (gastroesophageal reflux disease)   . Gout   . H/O hiatal hernia    had surgery for that 2012  [facility]  . Heartburn   . Hypertension   . Hypothyroidism   . PONV (postoperative nausea and vomiting)   . Sleep apnea    dx 1.5 yr ago...study @ [facility], does not use CPAP    Past Surgical History:  Procedure Laterality Date  . APPENDECTOMY    . COLONOSCOPY  Dec 2015   Dr Allen Norris  . COLONOSCOPY WITH PROPOFOL N/A 11/25/2017   Procedure: COLONOSCOPY WITH PROPOFOL;  Surgeon: Lucilla Lame, MD;  Location: [facility] ENDOSCOPY;  Service: Endoscopy;  Laterality: N/A;  . EXCISIONAL HEMORRHOIDECTOMY  01/02/15  . FOOT SURGERY     right foot 2012  . HERNIA REPAIR     Left inguinal  . HERNIA REPAIR      hiatal hernia at [place]  . INGUINAL HERNIA REPAIR Left 10/31/2017   Procedure: OPEN LEFT INGUINAL RECURRENT HERNIA REPAIR;  Surgeon: Johnathan Hausen, MD;  Location: [facility];  Service: General;  Laterality: Left;  [name] MANDIBLE SURGERY     1983  . REPLACEMENT TOTAL KNEE BILATERAL     patient had knee replacement to his right side in 2015 and right 2014, Revision of R knee 12/04/16  . ROTATOR CUFF REPAIR     x 3  on right shoulder  . SHOULDER ARTHROSCOPY WITH ROTATOR CUFF REPAIR AND SUBACROMIAL DECOMPRESSION Left 03/04/2013   Procedure: LEFT SHOULDER ARTHROSCOPY WITH ROTATOR CUFF REPAIR AND SUBACROMIAL DECOMPRESSION;  Surgeon: Marin Shutter, MD;  Location: [facility];  Service: Orthopedics;  Laterality: Left;  . SHOULDER SURGERY     x 3 on left    Social History   Socioeconomic History  . Marital status: Married    Spouse name: Not on file  . Number of children: Not on file  . Years of education: Not on file  . Highest education level: Not on file  Occupational History  .  Not on file  Social Needs  . Financial resource strain: Not on file  . Food insecurity:    Worry: Not on file    Inability: Not on file  . Transportation needs:    Medical: Not on file    Non-medical: Not on file  Tobacco Use  . Smoking status: Never Smoker  . Smokeless tobacco: Former Systems developer    Types: Chew  Substance and Sexual Activity  . Alcohol use: No    Alcohol/week: 6.0 - 8.0 standard drinks    Types: 6 - 8 Cans of beer per week    Frequency: Never  . Drug use: No  . Sexual activity: Yes  Lifestyle  . Physical activity:    Days per week: Not on file    Minutes per session: Not on file  . Stress: Not on file  Relationships  . Social connections:    Talks on phone: Not on file    Gets together: Not on file    Attends religious service: Not on file    Active member of club or organization: Not on file    Attends meetings of clubs or organizations: Not on file    Relationship status: Not  on file  . Intimate partner violence:    Fear of current or ex partner: Not on file    Emotionally abused: Not on file    Physically abused: Not on file    Forced sexual activity: Not on file  Other Topics Concern  . Not on file  Social History Narrative  . Not on file    Current Outpatient Medications on File Prior to Visit  Medication Sig Dispense Refill  . allopurinol (ZYLOPRIM) 100 MG tablet TAKE 1 TABLET(100 MG) BY MOUTH DAILY 30 tablet 11  . amLODipine (NORVASC) 10 MG tablet Take 5 mg by mouth daily.    . ARIPiprazole (ABILIFY) 2 MG tablet Take 1 tablet (2 mg total) by mouth daily. 30 tablet 1  . EPIPEN 2-PAK 0.3 MG/0.3ML SOAJ injection     . fluticasone (FLONASE) 50 MCG/ACT nasal spray Place 2 sprays into both nostrils daily. 16 g 1  . HYDROcodone-acetaminophen (NORCO) 10-325 MG tablet Take 1 tablet by mouth every 4 (four) hours as needed for severe pain. 20 tablet 0  . levothyroxine (SYNTHROID, LEVOTHROID) 25 MCG tablet TAKE 1 TABLET BY MOUTH DAILY BEFORE BREAKFAST 90 tablet 0  . lithium carbonate (ESKALITH) 450 MG CR tablet Take 1 tablet (450 mg total) by mouth daily. 30 tablet 1  . loratadine (CLARITIN) 10 MG tablet Take 10 mg by mouth daily.    . metFORMIN (GLUCOPHAGE) 500 MG tablet Take 1 tablet (500 mg total) by mouth 2 (two) times daily with a meal. 180 tablet 3  . montelukast (SINGULAIR) 10 MG tablet TK 1 T PO QPM  2  . Multiple Vitamin (MULTIVITAMIN WITH MINERALS) TABS Take 1 tablet by mouth daily.    [name] omeprazole (PRILOSEC) 20 MG capsule Take 20 mg by mouth daily.    [name] zolpidem (AMBIEN) 10 MG tablet Take 1 tablet (10 mg total) by mouth at bedtime as needed for sleep. 30 tablet 1   No current facility-administered medications on file prior to visit.     Allergies  Allergen Reactions  . Tequin [Gatifloxacin] Anaphylaxis and Rash  . Amoxicillin Hives    Family History  Problem Relation Age of Onset  . CAD Mother   . Arthritis Mother   . Angina Mother   .  Diabetes  Father   . Heart disease Father   . Colon polyps Father   . Heart attack Father   . Bipolar disorder Sister   . Asthma Sister   . Schizophrenia Sister   . Diabetes Maternal Grandmother   . Cancer Maternal Grandmother   . Heart attack Maternal Grandfather   . Stomach cancer Paternal Grandmother   . Kidney disease Neg Hx   . Prostate cancer Neg Hx   . Other Neg Hx        hypogonadism    BP 118/80   Pulse 75   Temp 98.1 F (36.7 C) (Oral)   Resp 16   Wt 210 lb (95.3 kg)   SpO2 98%   BMI 30.13 kg/m    Review of Systems denies numbness, weight change decreased urinary stream, gynecomastia, muscle weakness, fever, headache, easy bruising, sob, rash, blurry vision, rhinorrhea, chest pain.  No change in chronic depression     Objective:   Physical Exam VS: see vs page GEN: no distress HEAD: head: no deformity eyes: no periorbital swelling, no proptosis external nose and ears are normal mouth: no lesion seen NECK: supple, thyroid is not enlarged CHEST WALL: no deformity LUNGS: clear to auscultation CV: reg rate and rhythm, no murmur ABD: abdomen is soft, nontender.  no hepatosplenomegaly.  not distended.  no hernia MUSCULOSKELETAL: muscle bulk and strength are grossly normal.  no obvious joint swelling.  gait is normal and steady.   EXTEMITIES: no deformity.  no ulcer on the feet.  feet are of normal color and temp.  Trace bilat leg edema.   GENITALIA: Normal male scrotum, and penis.  Testicles are small and soft.  PULSES: no carotid bruit NEURO:  cn 2-12 grossly intact.   readily moves all 4's.  sensation is intact to touch on all 4's.   SKIN:  Normal texture and temperature.  No rash or suspicious lesion is visible.   NODES:  None palpable at the neck.   PSYCH: alert, well-oriented.  Does not appear anxious nor depressed.    Lab Results  Component Value Date   HGBA1C 6.5 (A) 06/01/2018   Lab Results  Component Value Date   PSA 0.8 09/23/2017   PSA 0.39  03/16/2007   I have reviewed outside records, and summarized: Pt was noted to have low testosterone, and referred here.  When he was seen by Dr Rosanna Randy, main symptom was sleep difficulty  Lab Results  Component Value Date   TESTOSTERONE 189 (L) 05/05/2018       Assessment & Plan:  Hypogonadism, new to me: uncertain etiology Polycythemia: with this hx, we'll have to go slow with replacement Sleep apnea: this could worsen with testosterone rx Alcohol intake: this can worsen low testosterone  Patient Instructions  Losing weight and avoiding alcohol can help, also.   Viagra is a safe medication--you should try it.   Testosterone treatment has risks, including increased or decreased fertility (depending on the type of treatment), hair loss, prostate cancer, benign prostate enlargement, blood clots, liver problems, lower hdl ("good cholesterol"), polycythemia (opposite of anemia), sleep apnea, and behavior changes.   our plan will be to change back to the clomiphene.  Please do blood tests in 1 month.   Please come back for a follow-up appointment in 3 months.

## 2018-06-03 DIAGNOSIS — M9905 Segmental and somatic dysfunction of pelvic region: Secondary | ICD-10-CM | POA: Diagnosis not present

## 2018-06-03 DIAGNOSIS — M9901 Segmental and somatic dysfunction of cervical region: Secondary | ICD-10-CM | POA: Diagnosis not present

## 2018-06-03 DIAGNOSIS — M5430 Sciatica, unspecified side: Secondary | ICD-10-CM | POA: Diagnosis not present

## 2018-06-08 DIAGNOSIS — M9901 Segmental and somatic dysfunction of cervical region: Secondary | ICD-10-CM | POA: Diagnosis not present

## 2018-06-08 DIAGNOSIS — M5430 Sciatica, unspecified side: Secondary | ICD-10-CM | POA: Diagnosis not present

## 2018-06-08 DIAGNOSIS — M9905 Segmental and somatic dysfunction of pelvic region: Secondary | ICD-10-CM | POA: Diagnosis not present

## 2018-06-10 ENCOUNTER — Telehealth: Payer: Self-pay | Admitting: Urology

## 2018-06-10 ENCOUNTER — Other Ambulatory Visit: Payer: Self-pay

## 2018-06-10 ENCOUNTER — Telehealth: Payer: Self-pay | Admitting: Family Medicine

## 2018-06-10 DIAGNOSIS — M791 Myalgia, unspecified site: Secondary | ICD-10-CM

## 2018-06-10 DIAGNOSIS — D649 Anemia, unspecified: Secondary | ICD-10-CM

## 2018-06-10 NOTE — Telephone Encounter (Signed)
Done and patient advised  ED 

## 2018-06-10 NOTE — Telephone Encounter (Signed)
Pt called to cancel lab only appt due to him also having labs drawn at his PCP office the same week. [name] will have Testosterone drawn.  FYI

## 2018-06-10 NOTE — Telephone Encounter (Signed)
Patient coming in Monday for f/u labs and wants to know... 1- is the requisition ready 2- does he need to be fasting.

## 2018-06-11 DIAGNOSIS — M791 Myalgia, unspecified site: Secondary | ICD-10-CM | POA: Diagnosis not present

## 2018-06-11 DIAGNOSIS — D649 Anemia, unspecified: Secondary | ICD-10-CM | POA: Diagnosis not present

## 2018-06-11 NOTE — Telephone Encounter (Signed)
Would you ask Jeremiah [name] to call when he had the blood work at Dr. Alben Spittle office so that I can watch for the results?

## 2018-06-12 LAB — CBC WITH DIFFERENTIAL/PLATELET
Basophils Absolute: 0.1 10*3/uL (ref 0.0–0.2)
Basos: 1 %
EOS (ABSOLUTE): 0.1 10*3/uL (ref 0.0–0.4)
Eos: 2 %
Hematocrit: 38 % (ref 37.5–51.0)
Hemoglobin: 12.2 g/dL — ABNORMAL LOW (ref 13.0–17.7)
Immature Grans (Abs): 0 10*3/uL (ref 0.0–0.1)
Immature Granulocytes: 0 %
Lymphocytes Absolute: 1.6 10*3/uL (ref 0.7–3.1)
Lymphs: 24 %
MCH: 22.8 pg — ABNORMAL LOW (ref 26.6–33.0)
MCHC: 32.1 g/dL (ref 31.5–35.7)
MCV: 71 fL — ABNORMAL LOW (ref 79–97)
Monocytes Absolute: 0.8 10*3/uL (ref 0.1–0.9)
Monocytes: 12 %
Neutrophils Absolute: 4.1 10*3/uL (ref 1.4–7.0)
Neutrophils: 61 %
Platelets: 316 10*3/uL (ref 150–450)
RBC: 5.35 x10E6/uL (ref 4.14–5.80)
RDW: 15.2 % (ref 12.3–15.4)
WBC: 6.6 10*3/uL (ref 3.4–10.8)

## 2018-06-12 LAB — IRON: Iron: 21 ug/dL — ABNORMAL LOW (ref 38–169)

## 2018-06-12 LAB — CK: Total CK: 223 U/L — ABNORMAL HIGH (ref 24–204)

## 2018-06-12 LAB — TESTOSTERONE: Testosterone: 533 ng/dL (ref 264–916)

## 2018-06-15 ENCOUNTER — Telehealth: Payer: Self-pay

## 2018-06-15 NOTE — Telephone Encounter (Signed)
Unable to leave a message, the mailbox is full.

## 2018-06-15 NOTE — Telephone Encounter (Signed)
Looks like they were done on 06-11-18   [facility]

## 2018-06-15 NOTE — Telephone Encounter (Signed)
-----   Message from Jerrol Banana., MD sent at 06/12/2018 11:54 AM EDT ----- Start daily iron RTC 1-2 months.

## 2018-06-15 NOTE — Telephone Encounter (Signed)
Please let Mr. Jeremiah [name] know that his testosterone level from Dr. Alben Spittle office was good.  We need to repeat before his next Testopel insertion in two months.

## 2018-06-15 NOTE — Telephone Encounter (Signed)
Pt advised.   Thanks,   -Alisi Lupien  

## 2018-06-15 NOTE — Telephone Encounter (Signed)
Appointment made for testosterone labs in 2 months. Please schedule appointment for next Testopel insertion.

## 2018-06-16 DIAGNOSIS — J301 Allergic rhinitis due to pollen: Secondary | ICD-10-CM | POA: Diagnosis not present

## 2018-06-16 DIAGNOSIS — J3089 Other allergic rhinitis: Secondary | ICD-10-CM | POA: Diagnosis not present

## 2018-06-16 NOTE — Telephone Encounter (Signed)
Ok, the lab has been scheduled already just making sure before I scheduled the testopel   Thanks  MB

## 2018-06-16 NOTE — Telephone Encounter (Signed)
He will need a testosterone level drawn one week prior to his next insertion, otherwise he is okay to schedule.

## 2018-06-16 NOTE — Telephone Encounter (Signed)
Jeremiah [name] is he ok to schedule for testopel?   [name]

## 2018-06-18 ENCOUNTER — Other Ambulatory Visit: Payer: Self-pay

## 2018-06-19 DIAGNOSIS — M9905 Segmental and somatic dysfunction of pelvic region: Secondary | ICD-10-CM | POA: Diagnosis not present

## 2018-06-19 DIAGNOSIS — M5430 Sciatica, unspecified side: Secondary | ICD-10-CM | POA: Diagnosis not present

## 2018-06-19 DIAGNOSIS — M9901 Segmental and somatic dysfunction of cervical region: Secondary | ICD-10-CM | POA: Diagnosis not present

## 2018-06-22 DIAGNOSIS — M5430 Sciatica, unspecified side: Secondary | ICD-10-CM | POA: Diagnosis not present

## 2018-06-22 DIAGNOSIS — M9901 Segmental and somatic dysfunction of cervical region: Secondary | ICD-10-CM | POA: Diagnosis not present

## 2018-06-22 DIAGNOSIS — M9905 Segmental and somatic dysfunction of pelvic region: Secondary | ICD-10-CM | POA: Diagnosis not present

## 2018-06-24 DIAGNOSIS — M5430 Sciatica, unspecified side: Secondary | ICD-10-CM | POA: Diagnosis not present

## 2018-06-24 DIAGNOSIS — J301 Allergic rhinitis due to pollen: Secondary | ICD-10-CM | POA: Diagnosis not present

## 2018-06-24 DIAGNOSIS — M9905 Segmental and somatic dysfunction of pelvic region: Secondary | ICD-10-CM | POA: Diagnosis not present

## 2018-06-24 DIAGNOSIS — M9901 Segmental and somatic dysfunction of cervical region: Secondary | ICD-10-CM | POA: Diagnosis not present

## 2018-06-25 DIAGNOSIS — J3089 Other allergic rhinitis: Secondary | ICD-10-CM | POA: Diagnosis not present

## 2018-06-29 DIAGNOSIS — M9905 Segmental and somatic dysfunction of pelvic region: Secondary | ICD-10-CM | POA: Diagnosis not present

## 2018-06-29 DIAGNOSIS — M9901 Segmental and somatic dysfunction of cervical region: Secondary | ICD-10-CM | POA: Diagnosis not present

## 2018-06-29 DIAGNOSIS — M5430 Sciatica, unspecified side: Secondary | ICD-10-CM | POA: Diagnosis not present

## 2018-06-30 DIAGNOSIS — M5136 Other intervertebral disc degeneration, lumbar region: Secondary | ICD-10-CM | POA: Diagnosis not present

## 2018-07-03 DIAGNOSIS — J3089 Other allergic rhinitis: Secondary | ICD-10-CM | POA: Diagnosis not present

## 2018-07-03 DIAGNOSIS — J301 Allergic rhinitis due to pollen: Secondary | ICD-10-CM | POA: Diagnosis not present

## 2018-07-06 DIAGNOSIS — M5430 Sciatica, unspecified side: Secondary | ICD-10-CM | POA: Diagnosis not present

## 2018-07-06 DIAGNOSIS — M9901 Segmental and somatic dysfunction of cervical region: Secondary | ICD-10-CM | POA: Diagnosis not present

## 2018-07-06 DIAGNOSIS — M9905 Segmental and somatic dysfunction of pelvic region: Secondary | ICD-10-CM | POA: Diagnosis not present

## 2018-07-07 ENCOUNTER — Telehealth: Payer: Self-pay

## 2018-07-07 NOTE — Telephone Encounter (Signed)
FYI Patient called to say that his glucose yesterday went up to 411 at 2 pm and a little higher than that in the evening.  Because of that he took an additional Metformin.  He said today it was down to about 290.  Discussed his diet and relationship to his level and then asked him if he was on any new antibiotic or steroid and he said he was on Prednisone for his back.  He said that he took the last one today.  He had not been checking the glucose so he did not know if it had been up prior to yesterday. I explained to him about the steroids and how it will make your glucose go up.  Instructed him to check his glucose over the next few days and that he should see it gradually come down   He is to call if he continues to get readings of 400 or if he should start to feel bad or have any spike higher than that.  He verbalized understanding and said he would let us know if he had any trouble.

## 2018-07-07 NOTE — Telephone Encounter (Signed)
thanks

## 2018-07-08 ENCOUNTER — Ambulatory Visit: Payer: 59 | Admitting: Family Medicine

## 2018-07-08 VITALS — BP 148/90 | HR 83 | Temp 97.8°F | Resp 16 | Wt 211.0 lb

## 2018-07-08 DIAGNOSIS — R7309 Other abnormal glucose: Secondary | ICD-10-CM

## 2018-07-08 DIAGNOSIS — E119 Type 2 diabetes mellitus without complications: Secondary | ICD-10-CM

## 2018-07-08 LAB — GLUCOSE, POCT (MANUAL RESULT ENTRY): POC Glucose: 324 mg/dl — AB (ref 70–99)

## 2018-07-08 MED ORDER — METFORMIN HCL 1000 MG PO TABS: 500.0000 mg | ORAL_TABLET | Freq: Two times a day (BID) | ORAL | 12 refills | Status: DC

## 2018-07-08 NOTE — Progress Notes (Signed)
Jeremiah [name]  MRN: [id number] DOB: 01-12-1961  Subjective:  HPI   The patient is a 57 year old male who presents for evaluation over concerns he has about his elevated glucose.  He called earlier to day and stated that his glucose this morning was 140 and then prior to eating lunch it went up to 230. The patient had called earlier in the week to say that his glucose went up to 411.  While talking tot he patient that day and trying to find a cause for the elevated glucose the patient admitted that he had been on Prednisone and prior to that an antibiotic.  The patient was put on the Prednisone for his back problems.  He was put on antibiotics for an infected root canal that has to be redone.  The patient finished the Prednisone 2 days ago and the antibiotics at least 1 week ago.   The patient states that on Monday when he had the glucose of 411 he had a headache and was feeling tired.  He took an extra Metformin that evening and the next morning it was down to about 200.    Patient Active Problem List   Diagnosis Date Noted  . Low testosterone 06/01/2018  . Degeneration of lumbar intervertebral disc 12/09/2017  . Benign neoplasm of cecum   . Left inguinal hernia 09/08/2017  . Right inguinal hernia 09/08/2017  . Umbilical hernia without obstruction and without gangrene 09/08/2017  . Allergic rhinitis due to pollen 06/23/2017  . Chest pain 03/05/2016  . Pedal edema 03/08/2015  . History of repair of rotator cuff 02/17/2014  . Diabetes ([place]) 08/31/2013  . BP (high blood pressure) 08/31/2013  . Adult hypothyroidism 08/31/2013  . Obstructive apnea 08/31/2013  . Clinical depression 08/31/2013  . Diabetes mellitus ([place]) 08/31/2013  . Idiopathic localized osteoarthropathy 05/31/2013  . ABDOMINAL PAIN, LEFT UPPER QUADRANT 10/04/2009  . TARSAL TUNNEL SYNDROME, RIGHT 09/25/2009  . ANKLE PAIN, RIGHT 09/25/2009  . OTHER ANKLE SPRAIN AND STRAIN 09/25/2009  . BREAST MASS, RIGHT 01/07/2008  .  INSOMNIA 01/07/2008  . GASTRIC ULCER, ACUTE 11/09/2007  . DUODENITIS 11/09/2007  . BLOOD IN STOOL, OCCULT 11/09/2007  . PERSONAL HISTORY OF COLONIC POLYPS 11/09/2007  . BACK PAIN, LUMBAR 10/12/2007  . HELICOBACTER PYLORI GASTRITIS 09/09/2007  . GASTRIC ULCER, ACUTE, HEMORRHAGE 09/09/2007  . Hiatal hernia 09/09/2007  . COLONIC POLYPS, ADENOMATOUS 08/19/2007  . INTERNAL HEMORRHOIDS 08/19/2007  . EXTERNAL HEMORRHOIDS WITHOUT MENTION COMP 08/07/2007  . Blood in stool 08/07/2007  . ABDOMINAL PAIN, LEFT LOWER QUADRANT 08/07/2007  . SINUSITIS, ACUTE 06/12/2007  . ABDOMINAL PAIN 06/12/2007  . DIABETES MELLITUS, TYPE II 03/19/2007  . GOUT 03/19/2007  . DISORDER, CIRCADIAN RHYTHM SLEEP, NONORGANIC 03/19/2007  . Depression 03/19/2007  . OSTEOARTHROSIS, GENERALIZED, MULTIPLE SITES 03/19/2007    Past Medical History:  Diagnosis Date  . Anxiety   . Arthritis    bil knees  . Bipolar disorder ([place])   . Colon polyp Dec 2015   Dr Allen Norris  . Complication of anesthesia   . Depression    dx 2004  . Diabetes mellitus without complication ([place])    type ll  controlled by weight & diet  . GERD (gastroesophageal reflux disease)   . Gout   . H/O hiatal hernia    had surgery for that 2012  [facility]  . Heartburn   . Hypertension   . Hypothyroidism   . PONV (postoperative nausea and vomiting)   . Sleep apnea  dx 1.5 yr ago...study @ [facility], does not use CPAP    Social History   Socioeconomic History  . Marital status: Married    Spouse name: Not on file  . Number of children: Not on file  . Years of education: Not on file  . Highest education level: Not on file  Occupational History  . Not on file  Social Needs  . Financial resource strain: Not on file  . Food insecurity:    Worry: Not on file    Inability: Not on file  . Transportation needs:    Medical: Not on file    Non-medical: Not on file  Tobacco Use  . Smoking status: Never Smoker  . Smokeless tobacco: Former Systems developer     Types: Chew  Substance and Sexual Activity  . Alcohol use: No    Alcohol/week: 6.0 - 8.0 standard drinks    Types: 6 - 8 Cans of beer per week    Frequency: Never  . Drug use: No  . Sexual activity: Yes  Lifestyle  . Physical activity:    Days per week: Not on file    Minutes per session: Not on file  . Stress: Not on file  Relationships  . Social connections:    Talks on phone: Not on file    Gets together: Not on file    Attends religious service: Not on file    Active member of club or organization: Not on file    Attends meetings of clubs or organizations: Not on file    Relationship status: Not on file  . Intimate partner violence:    Fear of current or ex partner: Not on file    Emotionally abused: Not on file    Physically abused: Not on file    Forced sexual activity: Not on file  Other Topics Concern  . Not on file  Social History Narrative  . Not on file    Outpatient Encounter Medications as of 07/08/2018  Medication Sig Note  . allopurinol (ZYLOPRIM) 100 MG tablet TAKE 1 TABLET(100 MG) BY MOUTH DAILY   . amLODipine (NORVASC) 10 MG tablet Take 5 mg by mouth daily.   . ARIPiprazole (ABILIFY) 2 MG tablet Take 1 tablet (2 mg total) by mouth daily.   [name] EPIPEN 2-PAK 0.3 MG/0.3ML SOAJ injection  10/31/2017: Has but never used   . fluticasone (FLONASE) 50 MCG/ACT nasal spray Place 2 sprays into both nostrils daily.   [name] HYDROcodone-acetaminophen (NORCO) 10-325 MG tablet Take 1 tablet by mouth every 4 (four) hours as needed for severe pain.   [name] levothyroxine (SYNTHROID, LEVOTHROID) 25 MCG tablet TAKE 1 TABLET BY MOUTH DAILY BEFORE BREAKFAST   . lithium carbonate (ESKALITH) 450 MG CR tablet Take 1 tablet (450 mg total) by mouth daily.   [name] loratadine (CLARITIN) 10 MG tablet Take 10 mg by mouth daily.   . metFORMIN (GLUCOPHAGE) 500 MG tablet Take 1 tablet (500 mg total) by mouth 2 (two) times daily with a meal.   . montelukast (SINGULAIR) 10 MG tablet TK 1 T PO QPM   .  Multiple Vitamin (MULTIVITAMIN WITH MINERALS) TABS Take 1 tablet by mouth daily.   [name] omeprazole (PRILOSEC) 20 MG capsule Take 20 mg by mouth daily.   [name] zolpidem (AMBIEN) 10 MG tablet Take 1 tablet (10 mg total) by mouth at bedtime as needed for sleep.    No facility-administered encounter medications on file as of 07/08/2018.     Allergies  Allergen Reactions  . Tequin [Gatifloxacin] Anaphylaxis and Rash  . Amoxicillin Hives    Review of Systems  Constitutional: Positive for malaise/fatigue. Negative for fever.  Eyes: Positive for blurred vision.  Respiratory: Negative for cough and shortness of breath.   Cardiovascular: Negative for chest pain.  Gastrointestinal: Negative.   Genitourinary: Positive for frequency (while on the prednisone).  Skin: Negative.   Neurological: Positive for headaches. Negative for dizziness.  Endo/Heme/Allergies: Positive for polydipsia (while being on the Prednisone).  Psychiatric/Behavioral: Negative.     Objective:  BP (!) 148/90 (BP Location: Right Arm, Patient Position: Sitting, Cuff Size: Normal)   Pulse 83   Temp 97.8 F (36.6 C) (Oral)   Resp 16   Wt 211 lb (95.7 kg)   BMI 30.28 kg/m   Physical Exam  Constitutional: He is oriented to person, place, and time and well-developed, well-nourished, and in no distress.  HENT:  Head: Normocephalic and atraumatic.  Eyes: Conjunctivae are normal.  Neck: No thyromegaly present.  Cardiovascular: Normal rate, regular rhythm and normal heart sounds.  Pulmonary/Chest: Effort normal and breath sounds normal.  Abdominal: Soft.  Musculoskeletal: He exhibits no edema.  Neurological: He is alert and oriented to person, place, and time. Gait normal. GCS score is 15.  Skin: Skin is warm and dry.  Psychiatric: Mood, memory, affect and judgment normal.    Assessment and Plan :  1. Elevated glucose BS should come down now that he is off of steroids.will follow. - POCT Glucose (CBG) - metFORMIN  (GLUCOPHAGE) 1000 MG tablet; Take 0.5 tablets (500 mg total) by mouth 2 (two) times daily with a meal.  Dispense: 60 tablet; Refill: 12  2. Diabetes mellitus without complication (HCC)  - metFORMIN (GLUCOPHAGE) 1000 MG tablet; Take 0.5 tablets (500 mg total) by mouth 2 (two) times daily with a meal.  Dispense: 60 tablet; Refill: 12 3.Bipolar Disorder  I have done the exam and reviewed the chart and it is accurate to the best of my knowledge. Development worker, community has been used and  any errors in dictation or transcription are unintentional. Miguel Aschoff M.D. [facility]

## 2018-07-14 DIAGNOSIS — J301 Allergic rhinitis due to pollen: Secondary | ICD-10-CM | POA: Diagnosis not present

## 2018-07-14 DIAGNOSIS — J3089 Other allergic rhinitis: Secondary | ICD-10-CM | POA: Diagnosis not present

## 2018-07-16 ENCOUNTER — Ambulatory Visit: Payer: Self-pay | Admitting: Family Medicine

## 2018-07-20 DIAGNOSIS — M5430 Sciatica, unspecified side: Secondary | ICD-10-CM | POA: Diagnosis not present

## 2018-07-20 DIAGNOSIS — M9905 Segmental and somatic dysfunction of pelvic region: Secondary | ICD-10-CM | POA: Diagnosis not present

## 2018-07-20 DIAGNOSIS — M9901 Segmental and somatic dysfunction of cervical region: Secondary | ICD-10-CM | POA: Diagnosis not present

## 2018-07-22 DIAGNOSIS — M9905 Segmental and somatic dysfunction of pelvic region: Secondary | ICD-10-CM | POA: Diagnosis not present

## 2018-07-22 DIAGNOSIS — M5430 Sciatica, unspecified side: Secondary | ICD-10-CM | POA: Diagnosis not present

## 2018-07-22 DIAGNOSIS — M9901 Segmental and somatic dysfunction of cervical region: Secondary | ICD-10-CM | POA: Diagnosis not present

## 2018-07-23 ENCOUNTER — Ambulatory Visit: Payer: Self-pay | Admitting: Family Medicine

## 2018-07-24 DIAGNOSIS — J3089 Other allergic rhinitis: Secondary | ICD-10-CM | POA: Diagnosis not present

## 2018-07-24 DIAGNOSIS — J301 Allergic rhinitis due to pollen: Secondary | ICD-10-CM | POA: Diagnosis not present

## 2018-07-27 ENCOUNTER — Ambulatory Visit: Payer: Self-pay | Admitting: Family Medicine

## 2018-07-29 ENCOUNTER — Ambulatory Visit: Payer: Self-pay | Admitting: Family Medicine

## 2018-08-02 ENCOUNTER — Other Ambulatory Visit: Payer: Self-pay | Admitting: Psychiatry

## 2018-08-03 ENCOUNTER — Other Ambulatory Visit: Payer: Self-pay | Admitting: Psychiatry

## 2018-08-03 DIAGNOSIS — M9901 Segmental and somatic dysfunction of cervical region: Secondary | ICD-10-CM | POA: Diagnosis not present

## 2018-08-03 DIAGNOSIS — M5430 Sciatica, unspecified side: Secondary | ICD-10-CM | POA: Diagnosis not present

## 2018-08-03 DIAGNOSIS — M9905 Segmental and somatic dysfunction of pelvic region: Secondary | ICD-10-CM | POA: Diagnosis not present

## 2018-08-04 ENCOUNTER — Ambulatory Visit: Payer: 59 | Admitting: Family Medicine

## 2018-08-04 ENCOUNTER — Ambulatory Visit (INDEPENDENT_AMBULATORY_CARE_PROVIDER_SITE_OTHER): Payer: 59 | Admitting: Family Medicine

## 2018-08-04 DIAGNOSIS — Z23 Encounter for immunization: Secondary | ICD-10-CM

## 2018-08-10 DIAGNOSIS — M9901 Segmental and somatic dysfunction of cervical region: Secondary | ICD-10-CM | POA: Diagnosis not present

## 2018-08-10 DIAGNOSIS — M9905 Segmental and somatic dysfunction of pelvic region: Secondary | ICD-10-CM | POA: Diagnosis not present

## 2018-08-10 DIAGNOSIS — M5430 Sciatica, unspecified side: Secondary | ICD-10-CM | POA: Diagnosis not present

## 2018-08-11 ENCOUNTER — Ambulatory Visit: Payer: Self-pay | Admitting: Psychiatry

## 2018-08-11 ENCOUNTER — Other Ambulatory Visit: Payer: Self-pay

## 2018-08-11 DIAGNOSIS — J3089 Other allergic rhinitis: Secondary | ICD-10-CM | POA: Diagnosis not present

## 2018-08-11 DIAGNOSIS — J301 Allergic rhinitis due to pollen: Secondary | ICD-10-CM | POA: Diagnosis not present

## 2018-08-12 ENCOUNTER — Other Ambulatory Visit: Payer: 59

## 2018-08-12 DIAGNOSIS — E349 Endocrine disorder, unspecified: Secondary | ICD-10-CM | POA: Diagnosis not present

## 2018-08-13 ENCOUNTER — Other Ambulatory Visit: Payer: Self-pay

## 2018-08-13 LAB — TESTOSTERONE: Testosterone: 239 ng/dL — ABNORMAL LOW (ref 264–916)

## 2018-08-18 ENCOUNTER — Encounter: Payer: Self-pay | Admitting: Urology

## 2018-08-18 ENCOUNTER — Ambulatory Visit (INDEPENDENT_AMBULATORY_CARE_PROVIDER_SITE_OTHER): Payer: 59 | Admitting: Urology

## 2018-08-18 VITALS — BP 129/84 | HR 96 | Ht 70.0 in | Wt 209.8 lb

## 2018-08-18 DIAGNOSIS — E349 Endocrine disorder, unspecified: Secondary | ICD-10-CM

## 2018-08-18 NOTE — Progress Notes (Signed)
Pt left without being seen. 6 testopels  were opened but not able to be used to due to pt leaving.

## 2018-08-20 ENCOUNTER — Ambulatory Visit (INDEPENDENT_AMBULATORY_CARE_PROVIDER_SITE_OTHER): Payer: 59 | Admitting: Urology

## 2018-08-20 ENCOUNTER — Encounter: Payer: Self-pay | Admitting: Urology

## 2018-08-20 VITALS — BP 129/81 | HR 80 | Ht 70.0 in | Wt 209.2 lb

## 2018-08-20 DIAGNOSIS — E349 Endocrine disorder, unspecified: Secondary | ICD-10-CM | POA: Diagnosis not present

## 2018-08-20 MED ORDER — TESTOSTERONE 75 MG IL PLLT
75.0000 mg | PELLET | Freq: Once | Status: AC
  Administered 2018-08-20: 75 mg

## 2018-08-20 NOTE — Progress Notes (Signed)
° °  08/20/2018 1:03 PM   Jeremiah [name] June 12, 1961 [id number]  Referring provider: Jerrol Banana., MD [address] [place], [place] [address]  No chief complaint on file.   HPI: Pt is a 57 -year-old male with testosterone deficiency who presents today for Testopel insertion. Patient was placed on the exam table in the right lateral jackknife position. Identified upper outer quadrant of hip for insertion; prepped area with Betadine and injected 10 cc's of Lidocaine 1% with Epinephrine to anesthetize superficially and distally along trocar tract. Made 3 mm incision using 15 blade of scalpel; trocar with sharp ended stylet was inserted into subcutaneous tissue in line with femur. Sharp stylet was withdrawn and 6 pellets were placed into trocar well. Testopel pellets advanced into tissue using blunt ended stylet. Trocar removed and incision closed using 6 Steri-Strips. Cleansed area to remove Betadine and covered Steri-Strips with outer Band-Aid. Careful inspection of insertion is done and patient informed of post procedure instructions. Advised patient to apply ice to the site for 20-30 minutes every hour if needed.  Avoid hot tubes, swimming or full water immersion of the insertion site for 72 hours.  Bandage may be removed after one week.   Patient was advised to contact the office if experiencing drainage of the insertion site, excessive redness or swelling of the site, chills and/or fevers > 101.5, nausea or vomiting, dizziness or lightheadedness and excessive tenderness.  Avoid strenuous activity and heavy lifting for 72 hours.    He will return in one month for serum testosterone.

## 2018-08-23 ENCOUNTER — Other Ambulatory Visit: Payer: Self-pay | Admitting: Psychiatry

## 2018-08-23 ENCOUNTER — Other Ambulatory Visit: Payer: Self-pay | Admitting: Family Medicine

## 2018-08-24 ENCOUNTER — Ambulatory Visit: Payer: 59 | Admitting: Psychiatry

## 2018-08-24 ENCOUNTER — Encounter: Payer: Self-pay | Admitting: Psychiatry

## 2018-08-24 DIAGNOSIS — F3160 Bipolar disorder, current episode mixed, unspecified: Secondary | ICD-10-CM

## 2018-08-24 MED ORDER — ARIPIPRAZOLE 2 MG PO TABS: 2.0000 mg | ORAL_TABLET | Freq: Every day | ORAL | 1 refills | Status: DC

## 2018-08-24 MED ORDER — LITHIUM CARBONATE ER 450 MG PO TBCR: 450.0000 mg | EXTENDED_RELEASE_TABLET | Freq: Every day | ORAL | 1 refills | Status: DC

## 2018-08-24 MED ORDER — BUPROPION HCL ER (SR) 100 MG PO TB12: 100.0000 mg | ORAL_TABLET | Freq: Every day | ORAL | 1 refills | Status: DC

## 2018-08-24 NOTE — Progress Notes (Signed)
Psychiatric MD Progress Note   Patient Identification: Jeremiah [name] MRN:  [id number] Date of Evaluation:  08/24/2018 Referral Source: Dr. Bridgett Larsson Chief Complaint:    Visit Diagnosis:    ICD-9-CM ICD-10-CM   1. Bipolar I disorder, most recent episode depressed ([place]) 296.50 F31.30   2.       Diagnosis:   Patient Active Problem List   Diagnosis Date Noted  . Low testosterone [R79.89] 06/01/2018  . Degeneration of lumbar intervertebral disc [M51.36] 12/09/2017  . Benign neoplasm of cecum [D12.0]   . Left inguinal hernia [K40.90] 09/08/2017  . Right inguinal hernia [K40.90] 09/08/2017  . Umbilical hernia without obstruction and without gangrene [K42.9] 09/08/2017  . Allergic rhinitis due to pollen [J30.1] 06/23/2017  . Chest pain [R07.9] 03/05/2016  . Pedal edema [R60.0] 03/08/2015  . History of repair of rotator cuff [Z98.890] 02/17/2014  . Diabetes ([place]) [E11.9] 08/31/2013  . BP (high blood pressure) [I10] 08/31/2013  . Adult hypothyroidism [E03.9] 08/31/2013  . Obstructive apnea [G47.33] 08/31/2013  . Clinical depression [F32.9] 08/31/2013  . Diabetes mellitus ([place]) [E11.9] 08/31/2013  . Idiopathic localized osteoarthropathy [M19.91] 05/31/2013  . ABDOMINAL PAIN, LEFT UPPER QUADRANT [R10.12] 10/04/2009  . TARSAL TUNNEL SYNDROME, RIGHT [G57.50] 09/25/2009  . ANKLE PAIN, RIGHT [M25.579] 09/25/2009  . OTHER ANKLE SPRAIN AND STRAIN [K56.256L, S96.819A] 09/25/2009  . BREAST MASS, RIGHT [N63.0] 01/07/2008  . INSOMNIA [G47.00] 01/07/2008  . GASTRIC ULCER, ACUTE [K25.3] 11/09/2007  . DUODENITIS [K29.80] 11/09/2007  . BLOOD IN STOOL, OCCULT [R19.5] 11/09/2007  . PERSONAL HISTORY OF COLONIC POLYPS [Z86.010] 11/09/2007  . BACK PAIN, LUMBAR [M54.5] 10/12/2007  . HELICOBACTER PYLORI GASTRITIS [A04.8] 09/09/2007  . GASTRIC ULCER, ACUTE, HEMORRHAGE [K25.0] 09/09/2007  . Hiatal hernia [K44.9] 09/09/2007  . COLONIC POLYPS, ADENOMATOUS [D12.6] 08/19/2007  . INTERNAL HEMORRHOIDS [K64.8]  08/19/2007  . EXTERNAL HEMORRHOIDS WITHOUT MENTION COMP [K64.4] 08/07/2007  . Blood in stool [K92.1] 08/07/2007  . ABDOMINAL PAIN, LEFT LOWER QUADRANT [R10.32] 08/07/2007  . SINUSITIS, ACUTE [J01.90] 06/12/2007  . ABDOMINAL PAIN [R10.9] 06/12/2007  . DIABETES MELLITUS, TYPE II [E11.9] 03/19/2007  . GOUT [M10.9] 03/19/2007  . DISORDER, CIRCADIAN RHYTHM SLEEP, NONORGANIC [G47.20, F51.8] 03/19/2007  . Depression [F32.9] 03/19/2007  . OSTEOARTHROSIS, GENERALIZED, MULTIPLE SITES [M15.9] 03/19/2007   History of Present Illness:   Patient is a 57 -year-old married male who presented for Follow-up. He reported that he has started feeling depressed since the time has changed and the winter weather is approaching.  He reported that he was doing well before, now feeling  tired and sleepy around 6:30 PM.  He reported that he is feeling depressed and wants his medications to be adjusted.  Patient reported that he has never felt so depressed in the past summer and now he feels that the weather change has affecting him negatively.  Patient reported that he wants to restart the Wellbutrin which he was taking in the past.  He is compliant with his other medications.  He denied having any suicidal homicidal ideations or plans.  He reported that his wife is supportive.  He wakes up early in the morning and has been doing well at his work.  Patient reported that he is also planning to go back to the gym and start exercising on a regular basis but has been having lack of motivation.  He has not started working out at this time.  He is able to contract for safety.           Past Medical History:  Past Medical History:  Diagnosis Date  . Anxiety   . Arthritis    bil knees  . Bipolar disorder ([place])   . Colon polyp Dec 2015   Dr Allen Norris  . Complication of anesthesia   . Depression    dx 2004  . Diabetes mellitus without complication ([place])    type ll  controlled by weight & diet  . GERD (gastroesophageal  reflux disease)   . Gout   . H/O hiatal hernia    had surgery for that 2012  [facility]  . Heartburn   . Hypertension   . Hypothyroidism   . PONV (postoperative nausea and vomiting)   . Sleep apnea    dx 1.5 yr ago...study @ [facility], does not use CPAP    Past Surgical History:  Procedure Laterality Date  . APPENDECTOMY    . COLONOSCOPY  Dec 2015   Dr Allen Norris  . COLONOSCOPY WITH PROPOFOL N/A 11/25/2017   Procedure: COLONOSCOPY WITH PROPOFOL;  Surgeon: Lucilla Lame, MD;  Location: [facility] ENDOSCOPY;  Service: Endoscopy;  Laterality: N/A;  . EXCISIONAL HEMORRHOIDECTOMY  01/02/15  . FOOT SURGERY     right foot 2012  . HERNIA REPAIR     Left inguinal  . HERNIA REPAIR     hiatal hernia at [place]  . INGUINAL HERNIA REPAIR Left 10/31/2017   Procedure: OPEN LEFT INGUINAL RECURRENT HERNIA REPAIR;  Surgeon: Johnathan Hausen, MD;  Location: [facility];  Service: General;  Laterality: Left;  [name] MANDIBLE SURGERY     1983  . REPLACEMENT TOTAL KNEE BILATERAL     patient had knee replacement to his right side in 2015 and right 2014, Revision of R knee 12/04/16  . ROTATOR CUFF REPAIR     x 3  on right shoulder  . SHOULDER ARTHROSCOPY WITH ROTATOR CUFF REPAIR AND SUBACROMIAL DECOMPRESSION Left 03/04/2013   Procedure: LEFT SHOULDER ARTHROSCOPY WITH ROTATOR CUFF REPAIR AND SUBACROMIAL DECOMPRESSION;  Surgeon: Marin Shutter, MD;  Location: [place];  Service: Orthopedics;  Laterality: Left;  . SHOULDER SURGERY     x 3 on left   Family History:  Family History  Problem Relation Age of Onset  . CAD Mother   . Arthritis Mother   . Angina Mother   . Diabetes Father   . Heart disease Father   . Colon polyps Father   . Heart attack Father   . Bipolar disorder Sister   . Asthma Sister   . Schizophrenia Sister   . Diabetes Maternal Grandmother   . Cancer Maternal Grandmother   . Heart attack Maternal Grandfather   . Stomach cancer Paternal Grandmother   . Kidney disease Neg Hx   . Prostate  cancer Neg Hx   . Other Neg Hx        hypogonadism   Social History:   Social History   Socioeconomic History  . Marital status: Married    Spouse name: Not on file  . Number of children: Not on file  . Years of education: Not on file  . Highest education level: Not on file  Occupational History  . Not on file  Social Needs  . Financial resource strain: Not on file  . Food insecurity:    Worry: Not on file    Inability: Not on file  . Transportation needs:    Medical: Not on file    Non-medical: Not on file  Tobacco Use  . Smoking status: Never Smoker  . Smokeless  tobacco: Former Systems developer    Types: Chew  Substance and Sexual Activity  . Alcohol use: No    Alcohol/week: 6.0 - 8.0 standard drinks    Types: 6 - 8 Cans of beer per week    Frequency: Never  . Drug use: No  . Sexual activity: Yes  Lifestyle  . Physical activity:    Days per week: Not on file    Minutes per session: Not on file  . Stress: Not on file  Relationships  . Social connections:    Talks on phone: Not on file    Gets together: Not on file    Attends religious service: Not on file    Active member of club or organization: Not on file    Attends meetings of clubs or organizations: Not on file    Relationship status: Not on file  Other Topics Concern  . Not on file  Social History Narrative  . Not on file   Additional Social History:  Married x 4 years. This is his second marrigae. Works at [employer] in [employer] division.  Both have children from the first marriages. Patient drinks alcohol on a regular basis. He reported that this is lite alcohol and he does not feel that it is affecting his medication.  Musculoskeletal: Strength & Muscle Tone: within normal limits Gait & Station: normal Patient leans: N/A  Psychiatric Specialty Exam: Medication Refill   Anxiety  Symptoms include insomnia.    Insomnia     Review of Systems  Psychiatric/Behavioral: The patient has insomnia.     There were no  vitals taken for this visit.There is no height or weight on file to calculate BMI.  General Appearance: Casual  Eye Contact:  Fair  Speech:  Clear and Coherent and Normal Rate  Volume:  Normal  Mood:  Anxious  Affect:  Congruent  Thought Process:  Coherent and Goal Directed  Orientation:  Full (Time, Place, and Person)  Thought Content:  WDL  Suicidal Thoughts:  No  Homicidal Thoughts:  No  Memory:  Immediate;   Fair  Judgement:  Fair  Insight:  Fair  Psychomotor Activity:  Normal  Concentration:  Fair  Recall:  [employer] [place]  Language: Fair  Akathisia:  No  Handed:  Right  AIMS (if indicated):    Assets:  Communication Skills Desire for Improvement Housing Intimacy Physical Health Social Support  ADL's:  Intact  Cognition: WNL  Sleep:  7-8   Is the patient at risk to self?  No. Has the patient been a risk to self in the past 6 months?  No. Has the patient been a risk to self within the distant past?  No. Is the patient a risk to others?  No. Has the patient been a risk to others in the past 6 months?  No. Has the patient been a risk to others within the distant past?  No.  Allergies:   Allergies  Allergen Reactions  . Tequin [Gatifloxacin] Anaphylaxis and Rash  . Amoxicillin Hives   Current Medications: Current Outpatient Medications  Medication Sig Dispense Refill  . allopurinol (ZYLOPRIM) 100 MG tablet TAKE 1 TABLET(100 MG) BY MOUTH DAILY 30 tablet 11  . amLODipine (NORVASC) 10 MG tablet Take 5 mg by mouth daily.    . ARIPiprazole (ABILIFY) 2 MG tablet Take 1 tablet (2 mg total) by mouth daily. 30 tablet 1  . EPIPEN 2-PAK 0.3 MG/0.3ML SOAJ injection     . fluticasone (FLONASE) 50  MCG/ACT nasal spray Place 2 sprays into both nostrils daily. 16 g 1  . HYDROcodone-acetaminophen (NORCO) 10-325 MG tablet Take 1 tablet by mouth every 4 (four) hours as needed for severe pain. 20 tablet 0  . levothyroxine (SYNTHROID, LEVOTHROID) 25 MCG tablet TAKE 1  TABLET BY MOUTH DAILY BEFORE BREAKFAST 90 tablet 0  . lithium carbonate (ESKALITH) 450 MG CR tablet Take 1 tablet (450 mg total) by mouth daily. 30 tablet 1  . loratadine (CLARITIN) 10 MG tablet Take 10 mg by mouth daily.    . metFORMIN (GLUCOPHAGE) 1000 MG tablet Take 0.5 tablets (500 mg total) by mouth 2 (two) times daily with a meal. 60 tablet 12  . montelukast (SINGULAIR) 10 MG tablet TK 1 T PO QPM  2  . Multiple Vitamin (MULTIVITAMIN WITH MINERALS) TABS Take 1 tablet by mouth daily.    [name] omeprazole (PRILOSEC) 20 MG capsule Take 20 mg by mouth daily.    [name] zolpidem (AMBIEN) 10 MG tablet Take 1 tablet (10 mg total) by mouth at bedtime as needed for sleep. 30 tablet 1   No current facility-administered medications for this visit.     Previous Psychotropic Medications: Lithium Wellbutrin Abilify prozac zoloft celexa Depakote seroquel- could not function risperdal pristiq Lamotrigine Latuda belsorma Trazodone   Substance Abuse History in the last 12 months:  Yes.    Consequences of Substance Abuse: Negative NA  Medical Decision Making:  Review of Psycho-Social Stressors (1) and Review and summation of old records (2)  Treatment Plan Summary: Medication management     Discussed with patient at length about his medications treatment risks benefits and alternatives. Continue medications as prescribed  Start him on Wellbutrin 100 mg in the morning and he agreed with the plan. Continue lithium 450 mg at night.. He has prescription of Ambien 10  mg - no refill given at this time  Abilify 2 mg p.o. daily.  Discussed with him about the side effects the medication and he demonstrated understanding.  Follow-up in 1  month or earlier    More than 50% of the time spent in psychoeducation, counseling and coordination of care.    This note was generated in part or whole with voice recognition software. Voice regonition is usually quite accurate but there are transcription  errors that can and very often do occur. I apologize for any typographical errors that were not detected and corrected. Rainey Pines, MD    11/18/20192:33 PM

## 2018-08-25 ENCOUNTER — Encounter: Payer: Self-pay | Admitting: Urology

## 2018-08-25 ENCOUNTER — Ambulatory Visit (INDEPENDENT_AMBULATORY_CARE_PROVIDER_SITE_OTHER): Payer: 59 | Admitting: Urology

## 2018-08-25 VITALS — BP 148/92 | HR 93 | Ht 70.0 in | Wt 209.2 lb

## 2018-08-25 DIAGNOSIS — T148XXA Other injury of unspecified body region, initial encounter: Secondary | ICD-10-CM | POA: Diagnosis not present

## 2018-08-25 MED ORDER — SULFAMETHOXAZOLE-TRIMETHOPRIM 800-160 MG PO TABS: 1.0000 | ORAL_TABLET | Freq: Two times a day (BID) | ORAL | 0 refills | Status: DC

## 2018-08-25 NOTE — Progress Notes (Signed)
08/25/2018 9:34 AM   [name] Nov 02, 1960 [id number]  Referring provider: Jerrol Banana., MD [address] [place], [place] [address]  Chief Complaint  Patient presents with  . Follow-up    Pain,redness and tender to touch at injection site    HPI: Patient is a 57 year old Caucasian male who underwent Testopel insertion on August 20, 2018 who states he is not having pain, redness and tenderness at the insertion site.  He states that over the weekend he noted some bruising in the insertion area associated with some pain, redness and heat to the insertion site.  He has not had any purulent drainage from the site.  He states the site has been dry.  He has not had any fevers, chills, nausea or vomiting.      PMH: Past Medical History:  Diagnosis Date  . Anxiety   . Arthritis    bil knees  . Bipolar disorder ([place])   . Colon polyp Dec 2015   Dr Allen Norris  . Complication of anesthesia   . Depression    dx 2004  . Diabetes mellitus without complication ([place])    type ll  controlled by weight & diet  . GERD (gastroesophageal reflux disease)   . Gout   . H/O hiatal hernia    had surgery for that 2012  [facility]  . Heartburn   . Hypertension   . Hypothyroidism   . PONV (postoperative nausea and vomiting)   . Sleep apnea    dx 1.5 yr ago...study @ [facility], does not use CPAP    Surgical History: Past Surgical History:  Procedure Laterality Date  . APPENDECTOMY    . COLONOSCOPY  Dec 2015   Dr Allen Norris  . COLONOSCOPY WITH PROPOFOL N/A 11/25/2017   Procedure: COLONOSCOPY WITH PROPOFOL;  Surgeon: Lucilla Lame, MD;  Location: [facility] ENDOSCOPY;  Service: Endoscopy;  Laterality: N/A;  . EXCISIONAL HEMORRHOIDECTOMY  01/02/15  . FOOT SURGERY     right foot 2012  . HERNIA REPAIR     Left inguinal  . HERNIA REPAIR     hiatal hernia at [place]  . INGUINAL HERNIA REPAIR Left 10/31/2017   Procedure: OPEN LEFT INGUINAL RECURRENT HERNIA REPAIR;  Surgeon: Johnathan Hausen, MD;  Location: [place];  Service: General;  Laterality: Left;  [name] MANDIBLE SURGERY     1983  . REPLACEMENT TOTAL KNEE BILATERAL     patient had knee replacement to his right side in 2015 and right 2014, Revision of R knee 12/04/16  . ROTATOR CUFF REPAIR     x 3  on right shoulder  . SHOULDER ARTHROSCOPY WITH ROTATOR CUFF REPAIR AND SUBACROMIAL DECOMPRESSION Left 03/04/2013   Procedure: LEFT SHOULDER ARTHROSCOPY WITH ROTATOR CUFF REPAIR AND SUBACROMIAL DECOMPRESSION;  Surgeon: Marin Shutter, MD;  Location: [place];  Service: Orthopedics;  Laterality: Left;  . SHOULDER SURGERY     x 3 on left    Home Medications:  Allergies as of 08/25/2018      Reactions   Tequin [gatifloxacin] Anaphylaxis, Rash   Amoxicillin Hives      Medication List        Accurate as of 08/25/18  9:34 AM. Always use your most recent med list.          allopurinol 100 MG tablet Commonly known as:  ZYLOPRIM TAKE 1 TABLET(100 MG) BY MOUTH DAILY   amLODipine 10 MG tablet Commonly known as:  NORVASC Take 5  mg by mouth daily.   ARIPiprazole 2 MG tablet Commonly known as:  ABILIFY Take 1 tablet (2 mg total) by mouth daily.   buPROPion 100 MG 12 hr tablet Commonly known as:  WELLBUTRIN SR Take 1 tablet (100 mg total) by mouth daily.   EPIPEN 2-PAK 0.3 mg/0.3 mL Soaj injection Generic drug:  EPINEPHrine   fluticasone 50 MCG/ACT nasal spray Commonly known as:  FLONASE Place 2 sprays into both nostrils daily.   HYDROcodone-acetaminophen 10-325 MG tablet Commonly known as:  NORCO Take 1 tablet by mouth every 4 (four) hours as needed for severe pain.   levothyroxine 25 MCG tablet Commonly known as:  SYNTHROID, LEVOTHROID TAKE 1 TABLET BY MOUTH DAILY BEFORE BREAKFAST   lithium carbonate 450 MG CR tablet Commonly known as:  ESKALITH Take 1 tablet (450 mg total) by mouth daily.   loratadine 10 MG tablet Commonly known as:  CLARITIN Take 10 mg by mouth daily.   metFORMIN  1000 MG tablet Commonly known as:  GLUCOPHAGE Take 0.5 tablets (500 mg total) by mouth 2 (two) times daily with a meal.   montelukast 10 MG tablet Commonly known as:  SINGULAIR TK 1 T PO QPM   multivitamin with minerals Tabs tablet Take 1 tablet by mouth daily.   omeprazole 20 MG capsule Commonly known as:  PRILOSEC Take 20 mg by mouth daily.   sulfamethoxazole-trimethoprim 800-160 MG tablet Commonly known as:  BACTRIM DS,SEPTRA DS Take 1 tablet by mouth every 12 (twelve) hours.   zolpidem 10 MG tablet Commonly known as:  AMBIEN Take 1 tablet (10 mg total) by mouth at bedtime as needed for sleep.       Allergies:  Allergies  Allergen Reactions  . Tequin [Gatifloxacin] Anaphylaxis and Rash  . Amoxicillin Hives    Family History: Family History  Problem Relation Age of Onset  . CAD Mother   . Arthritis Mother   . Angina Mother   . Diabetes Father   . Heart disease Father   . Colon polyps Father   . Heart attack Father   . Bipolar disorder Sister   . Asthma Sister   . Schizophrenia Sister   . Diabetes Maternal Grandmother   . Cancer Maternal Grandmother   . Heart attack Maternal Grandfather   . Stomach cancer Paternal Grandmother   . Kidney disease Neg Hx   . Prostate cancer Neg Hx   . Other Neg Hx        hypogonadism    Social History:  reports that he has never smoked. He quit smokeless tobacco use about 28 years ago.  His smokeless tobacco use included chew. He reports that he does not drink alcohol or use drugs.  ROS: UROLOGY Frequent Urination?: Yes Hard to postpone urination?: No Burning/pain with urination?: No Get up at night to urinate?: Yes Leakage of urine?: No Urine stream starts and stops?: No Trouble starting stream?: No Do you have to strain to urinate?: No Blood in urine?: No Urinary tract infection?: No Sexually transmitted disease?: No Injury to kidneys or bladder?: No Painful intercourse?: No Weak stream?: No Erection  problems?: No Penile pain?: No  Gastrointestinal Nausea?: No Vomiting?: No Indigestion/heartburn?: No Diarrhea?: No Constipation?: No  Constitutional Fever: No Night sweats?: No Weight loss?: No Fatigue?: Yes  Skin Skin rash/lesions?: No Itching?: No  Eyes Blurred vision?: No Double vision?: No  Ears/Nose/Throat Sore throat?: No Sinus problems?: No  Hematologic/Lymphatic Swollen glands?: No Easy bruising?: No  Cardiovascular Leg swelling?: No  Chest pain?: No  Respiratory Cough?: No Shortness of breath?: No  Endocrine Excessive thirst?: No  Musculoskeletal Back pain?: No  Neurological Headaches?: No Dizziness?: No  Psychologic Depression?: No Anxiety?: No  Physical Exam: BP (!) 148/92 (BP Location: Left Arm, Patient Position: Sitting, Cuff Size: Large)   Pulse 93   Ht 5\' 10"  (1.778 m)   Wt 209 lb 3.2 oz (94.9 kg)   BMI 30.02 kg/m   Insertion site is clean and dry.  There is no erythema, fluctuance or crepitus.  There is an area of induration along the insertion tract that is slightly painful to palpation.  When the indurated site is pressed firmly there is no discharge from the insertion site.  Laboratory Data: Lab Results  Component Value Date   WBC 6.6 06/11/2018   HGB 12.2 (L) 06/11/2018   HCT 38.0 06/11/2018   MCV 71 (L) 06/11/2018   PLT 316 06/11/2018    Lab Results  Component Value Date   CREATININE 0.99 05/05/2018    Lab Results  Component Value Date   PSA 0.8 09/23/2017   PSA 0.39 03/16/2007    Lab Results  Component Value Date   TESTOSTERONE 239 (L) 08/12/2018    Lab Results  Component Value Date   HGBA1C 6.5 (A) 06/01/2018    Lab Results  Component Value Date   TSH 2.230 05/05/2018       Component Value Date/Time   CHOL 104 03/23/2018 0937   HDL 35 (L) 03/23/2018 0937   CHOLHDL 3.8 CALC 03/16/2007 1007   VLDL 26 03/16/2007 1007   LDLCALC 50 03/23/2018 0937    Lab Results  Component Value Date   AST  28 05/05/2018   Lab Results  Component Value Date   ALT 24 05/05/2018   No components found for: ALKALINEPHOPHATASE No components found for: BILIRUBINTOTAL  No results found for: ESTRADIOL  Urinalysis    Component Value Date/Time   COLORURINE Yellow 10/27/2011 0320   APPEARANCEUR Clear 10/27/2011 0320   LABSPEC 1.012 10/27/2011 0320   PHURINE 5.0 10/27/2011 0320   GLUCOSEU Negative 10/27/2011 0320   HGBUR Negative 10/27/2011 0320   BILIRUBINUR neg 09/11/2016 0944   BILIRUBINUR Negative 10/27/2011 0320   KETONESUR Trace 10/27/2011 0320   PROTEINUR neg 09/11/2016 0944   PROTEINUR Negative 10/27/2011 0320   UROBILINOGEN 0.2 09/11/2016 0944   NITRITE neg 09/11/2016 0944   NITRITE Negative 10/27/2011 0320   LEUKOCYTESUR Negative 09/11/2016 0944   LEUKOCYTESUR Negative 10/27/2011 0320    I have reviewed the labs. I have independently reviewed the films.    Assessment & Plan:    1.  Hematoma I feel the area of induration is likely due to a hematoma as patient states he noted bruising in the area over the weekend and lidocaine alone was used to anesthetize the area prior to the insertion I will start Septra DS twice daily empirically to cover any possible skin pathogens that may be present I will have her return in 2 days for site recheck Patient is advised that if they should start to experience pain that is not able to be controlled with pain medication, intractable nausea and/or vomiting and/or fevers greater than 103 or shaking chills to contact the office immediately, purulent drainage from the insertion site or skin becomes bright red seek treatment in the emergency department for emergent intervention.    Return for put on schedule Thursday around 11 am; okay to double book .  These notes generated with voice  recognition software. I apologize for typographical errors.  Zara Council, PA-C  [facility] [address]  [place], [place] [address] [phone]

## 2018-08-27 ENCOUNTER — Encounter: Payer: Self-pay | Admitting: Urology

## 2018-08-27 ENCOUNTER — Other Ambulatory Visit: Payer: Self-pay

## 2018-08-27 ENCOUNTER — Ambulatory Visit (INDEPENDENT_AMBULATORY_CARE_PROVIDER_SITE_OTHER): Payer: 59 | Admitting: Urology

## 2018-08-27 VITALS — BP 136/81 | HR 83 | Wt 207.0 lb

## 2018-08-27 DIAGNOSIS — T148XXA Other injury of unspecified body region, initial encounter: Secondary | ICD-10-CM

## 2018-08-27 NOTE — Progress Notes (Signed)
08/27/2018 11:49 AM   Jeremiah [name] Aug 22, 1961 [id number]  Referring provider: Jerrol Banana., MD [address] [place], [place] [address]  Chief Complaint  Patient presents with  . Follow-up    HPI: Patient is a 57 y.o. Caucasian male who underwent Testopel insertion on August 20, 2018. He presents today for an insertion site recheck.  He reports reduced erythema and states that he was able to sleep on the last night. He denies taking blood thinners such as a daily aspirin. He states the site has been dry and denies purulent drainage from the site.  He has not had any fevers, chills, nausea or vomiting.      PMH: Past Medical History:  Diagnosis Date  . Anxiety   . Arthritis    bil knees  . Bipolar disorder ([place])   . Colon polyp Dec 2015   Dr Allen Norris  . Complication of anesthesia   . Depression    dx 2004  . Diabetes mellitus without complication ([place])    type ll  controlled by weight & diet  . GERD (gastroesophageal reflux disease)   . Gout   . H/O hiatal hernia    had surgery for that 2012  [facility]  . Heartburn   . Hypertension   . Hypothyroidism   . PONV (postoperative nausea and vomiting)   . Sleep apnea    dx 1.5 yr ago...study @ [facility], does not use CPAP    Surgical History: Past Surgical History:  Procedure Laterality Date  . APPENDECTOMY    . COLONOSCOPY  Dec 2015   Dr Allen Norris  . COLONOSCOPY WITH PROPOFOL N/A 11/25/2017   Procedure: COLONOSCOPY WITH PROPOFOL;  Surgeon: Lucilla Lame, MD;  Location: [facility] ENDOSCOPY;  Service: Endoscopy;  Laterality: N/A;  . EXCISIONAL HEMORRHOIDECTOMY  01/02/15  . FOOT SURGERY     right foot 2012  . HERNIA REPAIR     Left inguinal  . HERNIA REPAIR     hiatal hernia at [place]  . INGUINAL HERNIA REPAIR Left 10/31/2017   Procedure: OPEN LEFT INGUINAL RECURRENT HERNIA REPAIR;  Surgeon: Johnathan Hausen, MD;  Location: [place];  Service: General;  Laterality: Left;  [name] MANDIBLE SURGERY      1983  . REPLACEMENT TOTAL KNEE BILATERAL     patient had knee replacement to his right side in 2015 and right 2014, Revision of R knee 12/04/16  . ROTATOR CUFF REPAIR     x 3  on right shoulder  . SHOULDER ARTHROSCOPY WITH ROTATOR CUFF REPAIR AND SUBACROMIAL DECOMPRESSION Left 03/04/2013   Procedure: LEFT SHOULDER ARTHROSCOPY WITH ROTATOR CUFF REPAIR AND SUBACROMIAL DECOMPRESSION;  Surgeon: Marin Shutter, MD;  Location: [place];  Service: Orthopedics;  Laterality: Left;  . SHOULDER SURGERY     x 3 on left    Home Medications:  Allergies as of 08/27/2018      Reactions   Tequin [gatifloxacin] Anaphylaxis, Rash   Amoxicillin Hives      Medication List        Accurate as of 08/27/18 11:49 AM. Always use your most recent med list.          allopurinol 100 MG tablet Commonly known as:  ZYLOPRIM TAKE 1 TABLET(100 MG) BY MOUTH DAILY   amLODipine 10 MG tablet Commonly known as:  NORVASC Take 5 mg by mouth daily.   ARIPiprazole 2 MG tablet Commonly known as:  ABILIFY Take 1 tablet (2 mg total)  by mouth daily.   buPROPion 100 MG 12 hr tablet Commonly known as:  WELLBUTRIN SR Take 1 tablet (100 mg total) by mouth daily.   EPIPEN 2-PAK 0.3 mg/0.3 mL Soaj injection Generic drug:  EPINEPHrine   fluticasone 50 MCG/ACT nasal spray Commonly known as:  FLONASE Place 2 sprays into both nostrils daily.   HYDROcodone-acetaminophen 10-325 MG tablet Commonly known as:  NORCO Take 1 tablet by mouth every 4 (four) hours as needed for severe pain.   levothyroxine 25 MCG tablet Commonly known as:  SYNTHROID, LEVOTHROID TAKE 1 TABLET BY MOUTH DAILY BEFORE BREAKFAST   lithium carbonate 450 MG CR tablet Commonly known as:  ESKALITH Take 1 tablet (450 mg total) by mouth daily.   loratadine 10 MG tablet Commonly known as:  CLARITIN Take 10 mg by mouth daily.   metFORMIN 1000 MG tablet Commonly known as:  GLUCOPHAGE Take 0.5 tablets (500 mg total) by mouth 2 (two) times daily with  a meal.   montelukast 10 MG tablet Commonly known as:  SINGULAIR TK 1 T PO QPM   multivitamin with minerals Tabs tablet Take 1 tablet by mouth daily.   omeprazole 20 MG capsule Commonly known as:  PRILOSEC Take 20 mg by mouth daily.   sulfamethoxazole-trimethoprim 800-160 MG tablet Commonly known as:  BACTRIM DS,SEPTRA DS Take 1 tablet by mouth every 12 (twelve) hours.   zolpidem 10 MG tablet Commonly known as:  AMBIEN Take 1 tablet (10 mg total) by mouth at bedtime as needed for sleep.       Allergies:  Allergies  Allergen Reactions  . Tequin [Gatifloxacin] Anaphylaxis and Rash  . Amoxicillin Hives    Family History: Family History  Problem Relation Age of Onset  . CAD Mother   . Arthritis Mother   . Angina Mother   . Diabetes Father   . Heart disease Father   . Colon polyps Father   . Heart attack Father   . Bipolar disorder Sister   . Asthma Sister   . Schizophrenia Sister   . Diabetes Maternal Grandmother   . Cancer Maternal Grandmother   . Heart attack Maternal Grandfather   . Stomach cancer Paternal Grandmother   . Kidney disease Neg Hx   . Prostate cancer Neg Hx   . Other Neg Hx        hypogonadism    Social History:  reports that he has never smoked. He quit smokeless tobacco use about 28 years ago.  His smokeless tobacco use included chew. He reports that he does not drink alcohol or use drugs.  ROS: UROLOGY Frequent Urination?: No Hard to postpone urination?: No Burning/pain with urination?: No Get up at night to urinate?: Yes Leakage of urine?: No Urine stream starts and stops?: No Trouble starting stream?: No Do you have to strain to urinate?: No Blood in urine?: No Urinary tract infection?: No Sexually transmitted disease?: No Injury to kidneys or bladder?: No Painful intercourse?: No Weak stream?: No Erection problems?: No Penile pain?: No  Gastrointestinal Nausea?: No Vomiting?: No Indigestion/heartburn?: No Diarrhea?:  No Constipation?: No  Constitutional Fever: No Night sweats?: No Weight loss?: No Fatigue?: Yes  Skin Skin rash/lesions?: No Itching?: No  Eyes Blurred vision?: No Double vision?: No  Ears/Nose/Throat Sore throat?: No Sinus problems?: No  Hematologic/Lymphatic Swollen glands?: No Easy bruising?: No  Cardiovascular Leg swelling?: No Chest pain?: No  Respiratory Cough?: No Shortness of breath?: No  Endocrine Excessive thirst?: No  Musculoskeletal Back pain?: No  Joint pain?: No  Neurological Headaches?: No Dizziness?: No  Psychologic Depression?: Yes Anxiety?: Yes  Physical Exam: BP 136/81   Pulse 83   Wt 207 lb (93.9 kg)   BMI 29.70 kg/m   Insertion site is clean and dry.  There is no erythema, fluctuance or crepitus.  There is an area of induration along the insertion tract that is slightly painful to palpation.  When the indurated site is pressed firmly only blood is seen. No purulent drainage.  Laboratory Data: Lab Results  Component Value Date   WBC 6.6 06/11/2018   HGB 12.2 (L) 06/11/2018   HCT 38.0 06/11/2018   MCV 71 (L) 06/11/2018   PLT 316 06/11/2018    Lab Results  Component Value Date   CREATININE 0.99 05/05/2018    Lab Results  Component Value Date   PSA 0.8 09/23/2017   PSA 0.39 03/16/2007    Lab Results  Component Value Date   TESTOSTERONE 239 (L) 08/12/2018    Lab Results  Component Value Date   HGBA1C 6.5 (A) 06/01/2018    Lab Results  Component Value Date   TSH 2.230 05/05/2018       Component Value Date/Time   CHOL 104 03/23/2018 0937   HDL 35 (L) 03/23/2018 0937   CHOLHDL 3.8 CALC 03/16/2007 1007   VLDL 26 03/16/2007 1007   LDLCALC 50 03/23/2018 0937    Lab Results  Component Value Date   AST 28 05/05/2018   Lab Results  Component Value Date   ALT 24 05/05/2018   No components found for: ALKALINEPHOPHATASE No components found for: BILIRUBINTOTAL  No results found for:  ESTRADIOL  Urinalysis    Component Value Date/Time   COLORURINE Yellow 10/27/2011 0320   APPEARANCEUR Clear 10/27/2011 0320   LABSPEC 1.012 10/27/2011 0320   PHURINE 5.0 10/27/2011 0320   GLUCOSEU Negative 10/27/2011 0320   HGBUR Negative 10/27/2011 0320   BILIRUBINUR neg 09/11/2016 0944   BILIRUBINUR Negative 10/27/2011 0320   KETONESUR Trace 10/27/2011 0320   PROTEINUR neg 09/11/2016 0944   PROTEINUR Negative 10/27/2011 0320   UROBILINOGEN 0.2 09/11/2016 0944   NITRITE neg 09/11/2016 0944   NITRITE Negative 10/27/2011 0320   LEUKOCYTESUR Negative 09/11/2016 0944   LEUKOCYTESUR Negative 10/27/2011 0320   I have reviewed the labs.  Assessment & Plan:    1.  Hematoma I still feel the area of induration is likely due to a hematoma  Patient was advised to finish out the antibiotic, Septra DS twice daily empirically to cover any possible skin pathogens present.  I will have him return in 5 days for site recheck Patient is advised that if they should start to experience pain that is not able to be controlled with pain medication, intractable nausea and/or vomiting and/or fevers greater than 103 or shaking chills to contact the office immediately, purulent drainage from the insertion site or skin becomes bright red seek treatment in the emergency department for emergent intervention.    Return in about 5 days (around 09/01/2018) for recheck .  These notes generated with voice recognition software. I apologize for typographical errors.  Laneta Simmers  [facility] [address]  [place], [place] [address] [phone]  I, Temidayo Atanda-Ogunleye , am acting as a Education administrator for [employer], PA-C.   I have reviewed the above documentation for accuracy and completeness, and I agree with the above.    Zara Council, PA-C

## 2018-08-28 DIAGNOSIS — J301 Allergic rhinitis due to pollen: Secondary | ICD-10-CM | POA: Diagnosis not present

## 2018-08-28 DIAGNOSIS — J3089 Other allergic rhinitis: Secondary | ICD-10-CM | POA: Diagnosis not present

## 2018-09-01 ENCOUNTER — Ambulatory Visit: Payer: Self-pay | Admitting: Endocrinology

## 2018-09-01 ENCOUNTER — Encounter: Payer: Self-pay | Admitting: Urology

## 2018-09-01 ENCOUNTER — Ambulatory Visit (INDEPENDENT_AMBULATORY_CARE_PROVIDER_SITE_OTHER): Payer: 59 | Admitting: Urology

## 2018-09-01 VITALS — BP 134/86 | HR 91 | Ht 70.0 in | Wt 210.8 lb

## 2018-09-01 DIAGNOSIS — T148XXA Other injury of unspecified body region, initial encounter: Secondary | ICD-10-CM

## 2018-09-06 NOTE — Progress Notes (Signed)
09/06/2018 6:18 PM   Jeremiah [name] 1960/12/03 [id number]  Referring provider: Jerrol Banana., MD [address] [place], [place] [address]  Chief Complaint  Patient presents with  . Follow-up    HPI: Patient is a 57 y.o. Caucasian male who underwent Testopel insertion on August 20, 2018. He presents today for an insertion site recheck.  He states he is continuing to improve.  He has not had any erythema, tenderness or purulent  drainage from the site.  Patient denies any fevers, chills, nausea or vomiting.      PMH: Past Medical History:  Diagnosis Date  . Anxiety   . Arthritis    bil knees  . Bipolar disorder ([place])   . Colon polyp Dec 2015   Dr Allen Norris  . Complication of anesthesia   . Depression    dx 2004  . Diabetes mellitus without complication ([place])    type ll  controlled by weight & diet  . GERD (gastroesophageal reflux disease)   . Gout   . H/O hiatal hernia    had surgery for that 2012  [facility]  . Heartburn   . Hypertension   . Hypothyroidism   . PONV (postoperative nausea and vomiting)   . Sleep apnea    dx 1.5 yr ago...study @ [facility], does not use CPAP    Surgical History: Past Surgical History:  Procedure Laterality Date  . APPENDECTOMY    . COLONOSCOPY  Dec 2015   Dr Allen Norris  . COLONOSCOPY WITH PROPOFOL N/A 11/25/2017   Procedure: COLONOSCOPY WITH PROPOFOL;  Surgeon: Lucilla Lame, MD;  Location: [facility] ENDOSCOPY;  Service: Endoscopy;  Laterality: N/A;  . EXCISIONAL HEMORRHOIDECTOMY  01/02/15  . FOOT SURGERY     right foot 2012  . HERNIA REPAIR     Left inguinal  . HERNIA REPAIR     hiatal hernia at [place]  . INGUINAL HERNIA REPAIR Left 10/31/2017   Procedure: OPEN LEFT INGUINAL RECURRENT HERNIA REPAIR;  Surgeon: Johnathan Hausen, MD;  Location: [place];  Service: General;  Laterality: Left;  [name] MANDIBLE SURGERY     1983  . REPLACEMENT TOTAL KNEE BILATERAL     patient had knee replacement to his right side in  2015 and right 2014, Revision of R knee 12/04/16  . ROTATOR CUFF REPAIR     x 3  on right shoulder  . SHOULDER ARTHROSCOPY WITH ROTATOR CUFF REPAIR AND SUBACROMIAL DECOMPRESSION Left 03/04/2013   Procedure: LEFT SHOULDER ARTHROSCOPY WITH ROTATOR CUFF REPAIR AND SUBACROMIAL DECOMPRESSION;  Surgeon: Marin Shutter, MD;  Location: [place];  Service: Orthopedics;  Laterality: Left;  . SHOULDER SURGERY     x 3 on left    Home Medications:  Allergies as of 09/01/2018      Reactions   Tequin [gatifloxacin] Anaphylaxis, Rash   Amoxicillin Hives      Medication List        Accurate as of 09/01/18 11:59 PM. Always use your most recent med list.          allopurinol 100 MG tablet Commonly known as:  ZYLOPRIM TAKE 1 TABLET(100 MG) BY MOUTH DAILY   amLODipine 10 MG tablet Commonly known as:  NORVASC Take 5 mg by mouth daily.   ARIPiprazole 2 MG tablet Commonly known as:  ABILIFY Take 1 tablet (2 mg total) by mouth daily.   buPROPion 100 MG 12 hr tablet Commonly known as:  WELLBUTRIN SR Take 1 tablet (  100 mg total) by mouth daily.   EPIPEN 2-PAK 0.3 mg/0.3 mL Soaj injection Generic drug:  EPINEPHrine   fluticasone 50 MCG/ACT nasal spray Commonly known as:  FLONASE Place 2 sprays into both nostrils daily.   HYDROcodone-acetaminophen 10-325 MG tablet Commonly known as:  NORCO Take 1 tablet by mouth every 4 (four) hours as needed for severe pain.   levothyroxine 25 MCG tablet Commonly known as:  SYNTHROID, LEVOTHROID TAKE 1 TABLET BY MOUTH DAILY BEFORE BREAKFAST   lithium carbonate 450 MG CR tablet Commonly known as:  ESKALITH Take 1 tablet (450 mg total) by mouth daily.   loratadine 10 MG tablet Commonly known as:  CLARITIN Take 10 mg by mouth daily.   metFORMIN 1000 MG tablet Commonly known as:  GLUCOPHAGE Take 0.5 tablets (500 mg total) by mouth 2 (two) times daily with a meal.   montelukast 10 MG tablet Commonly known as:  SINGULAIR TK 1 T PO QPM   multivitamin  with minerals Tabs tablet Take 1 tablet by mouth daily.   omeprazole 20 MG capsule Commonly known as:  PRILOSEC Take 20 mg by mouth daily.   zolpidem 10 MG tablet Commonly known as:  AMBIEN Take 1 tablet (10 mg total) by mouth at bedtime as needed for sleep.       Allergies:  Allergies  Allergen Reactions  . Tequin [Gatifloxacin] Anaphylaxis and Rash  . Amoxicillin Hives    Family History: Family History  Problem Relation Age of Onset  . CAD Mother   . Arthritis Mother   . Angina Mother   . Diabetes Father   . Heart disease Father   . Colon polyps Father   . Heart attack Father   . Bipolar disorder Sister   . Asthma Sister   . Schizophrenia Sister   . Diabetes Maternal Grandmother   . Cancer Maternal Grandmother   . Heart attack Maternal Grandfather   . Stomach cancer Paternal Grandmother   . Kidney disease Neg Hx   . Prostate cancer Neg Hx   . Other Neg Hx        hypogonadism    Social History:  reports that he has never smoked. He quit smokeless tobacco use about 28 years ago.  His smokeless tobacco use included chew. He reports that he does not drink alcohol or use drugs.  ROS: UROLOGY Frequent Urination?: No Hard to postpone urination?: No Burning/pain with urination?: No Get up at night to urinate?: Yes Leakage of urine?: No Urine stream starts and stops?: No Trouble starting stream?: No Do you have to strain to urinate?: No Blood in urine?: No Urinary tract infection?: No Sexually transmitted disease?: No Injury to kidneys or bladder?: No Painful intercourse?: No Weak stream?: No Erection problems?: No Penile pain?: No  Gastrointestinal Nausea?: No Vomiting?: No Indigestion/heartburn?: No Diarrhea?: No Constipation?: No  Constitutional Fever: No Night sweats?: No Weight loss?: No Fatigue?: Yes  Skin Skin rash/lesions?: No Itching?: No  Eyes Blurred vision?: No Double vision?: No  Ears/Nose/Throat Sore throat?: No Sinus  problems?: No  Hematologic/Lymphatic Swollen glands?: No Easy bruising?: No  Cardiovascular Leg swelling?: No Chest pain?: No  Respiratory Cough?: No Shortness of breath?: No  Endocrine Excessive thirst?: No  Musculoskeletal Back pain?: No Joint pain?: No  Neurological Headaches?: No Dizziness?: No  Psychologic Depression?: Yes Anxiety?: No  Physical Exam: BP 134/86 (BP Location: Left Arm)   Pulse 91   Ht 5\' 10"  (1.778 m)   Wt 210 lb 12.8 oz (  95.6 kg)   BMI 30.25 kg/m   Insertion site is clean and dry.  There is no erythema, fluctuance or crepitus.  There is a small area of induration along the insertion tract.  When the indurated site is pressed firmly only blood is seen. No purulent drainage.  Laboratory Data: Lab Results  Component Value Date   WBC 6.6 06/11/2018   HGB 12.2 (L) 06/11/2018   HCT 38.0 06/11/2018   MCV 71 (L) 06/11/2018   PLT 316 06/11/2018    Lab Results  Component Value Date   CREATININE 0.99 05/05/2018    Lab Results  Component Value Date   PSA 0.8 09/23/2017   PSA 0.39 03/16/2007    Lab Results  Component Value Date   TESTOSTERONE 239 (L) 08/12/2018    Lab Results  Component Value Date   HGBA1C 6.5 (A) 06/01/2018    Lab Results  Component Value Date   TSH 2.230 05/05/2018       Component Value Date/Time   CHOL 104 03/23/2018 0937   HDL 35 (L) 03/23/2018 0937   CHOLHDL 3.8 CALC 03/16/2007 1007   VLDL 26 03/16/2007 1007   LDLCALC 50 03/23/2018 0937    Lab Results  Component Value Date   AST 28 05/05/2018   Lab Results  Component Value Date   ALT 24 05/05/2018   No components found for: ALKALINEPHOPHATASE No components found for: BILIRUBINTOTAL  No results found for: ESTRADIOL  Urinalysis    Component Value Date/Time   COLORURINE Yellow 10/27/2011 0320   APPEARANCEUR Clear 10/27/2011 0320   LABSPEC 1.012 10/27/2011 0320   PHURINE 5.0 10/27/2011 0320   GLUCOSEU Negative 10/27/2011 0320   HGBUR  Negative 10/27/2011 0320   BILIRUBINUR neg 09/11/2016 0944   BILIRUBINUR Negative 10/27/2011 0320   KETONESUR Trace 10/27/2011 0320   PROTEINUR neg 09/11/2016 0944   PROTEINUR Negative 10/27/2011 0320   UROBILINOGEN 0.2 09/11/2016 0944   NITRITE neg 09/11/2016 0944   NITRITE Negative 10/27/2011 0320   LEUKOCYTESUR Negative 09/11/2016 0944   LEUKOCYTESUR Negative 10/27/2011 0320   I have reviewed the labs.  Assessment & Plan:    1.  Hematoma I still feel the area of induration is likely due to a hematoma  Resolving Patient is advised that if they should start to experience pain that is not able to be controlled with pain medication, intractable nausea and/or vomiting and/or fevers greater than 103 or shaking chills to contact the office immediately, purulent drainage from the insertion site or skin becomes bright red seek treatment in the emergency department for emergent intervention.    Return for keep scheduled follow up .  These notes generated with voice recognition software. I apologize for typographical errors.  Laneta Simmers  [facility] [address]  [place], [place] [address] [phone]  I, Temidayo Atanda-Ogunleye , am acting as a Education administrator for [employer], PA-C.   I have reviewed the above documentation for accuracy and completeness, and I agree with the above.    Zara Council, PA-C

## 2018-09-11 DIAGNOSIS — J3089 Other allergic rhinitis: Secondary | ICD-10-CM | POA: Diagnosis not present

## 2018-09-11 DIAGNOSIS — J301 Allergic rhinitis due to pollen: Secondary | ICD-10-CM | POA: Diagnosis not present

## 2018-09-14 ENCOUNTER — Other Ambulatory Visit: Payer: Self-pay

## 2018-09-14 DIAGNOSIS — E349 Endocrine disorder, unspecified: Secondary | ICD-10-CM

## 2018-09-15 ENCOUNTER — Other Ambulatory Visit: Payer: 59

## 2018-09-15 DIAGNOSIS — E349 Endocrine disorder, unspecified: Secondary | ICD-10-CM

## 2018-09-16 ENCOUNTER — Other Ambulatory Visit: Payer: Self-pay

## 2018-09-16 ENCOUNTER — Other Ambulatory Visit: Payer: Self-pay | Admitting: Family Medicine

## 2018-09-16 DIAGNOSIS — M9905 Segmental and somatic dysfunction of pelvic region: Secondary | ICD-10-CM | POA: Diagnosis not present

## 2018-09-16 DIAGNOSIS — M9903 Segmental and somatic dysfunction of lumbar region: Secondary | ICD-10-CM | POA: Diagnosis not present

## 2018-09-16 DIAGNOSIS — M62830 Muscle spasm of back: Secondary | ICD-10-CM | POA: Diagnosis not present

## 2018-09-16 LAB — TESTOSTERONE: Testosterone: 327 ng/dL (ref 264–916)

## 2018-09-18 DIAGNOSIS — J301 Allergic rhinitis due to pollen: Secondary | ICD-10-CM | POA: Diagnosis not present

## 2018-09-18 DIAGNOSIS — J3089 Other allergic rhinitis: Secondary | ICD-10-CM | POA: Diagnosis not present

## 2018-09-21 ENCOUNTER — Ambulatory Visit: Payer: 59 | Admitting: Psychiatry

## 2018-09-22 DIAGNOSIS — J3089 Other allergic rhinitis: Secondary | ICD-10-CM | POA: Diagnosis not present

## 2018-09-22 DIAGNOSIS — J301 Allergic rhinitis due to pollen: Secondary | ICD-10-CM | POA: Diagnosis not present

## 2018-09-24 DIAGNOSIS — J301 Allergic rhinitis due to pollen: Secondary | ICD-10-CM | POA: Diagnosis not present

## 2018-09-24 DIAGNOSIS — J3089 Other allergic rhinitis: Secondary | ICD-10-CM | POA: Diagnosis not present

## 2018-10-01 DIAGNOSIS — J301 Allergic rhinitis due to pollen: Secondary | ICD-10-CM | POA: Diagnosis not present

## 2018-10-01 DIAGNOSIS — J3089 Other allergic rhinitis: Secondary | ICD-10-CM | POA: Diagnosis not present

## 2018-10-05 ENCOUNTER — Other Ambulatory Visit: Payer: Self-pay | Admitting: Psychiatry

## 2018-10-08 DIAGNOSIS — Z5181 Encounter for therapeutic drug level monitoring: Secondary | ICD-10-CM | POA: Diagnosis not present

## 2018-10-08 DIAGNOSIS — G894 Chronic pain syndrome: Secondary | ICD-10-CM | POA: Diagnosis not present

## 2018-10-08 DIAGNOSIS — Z79899 Other long term (current) drug therapy: Secondary | ICD-10-CM | POA: Diagnosis not present

## 2018-10-08 DIAGNOSIS — M545 Low back pain: Secondary | ICD-10-CM | POA: Diagnosis not present

## 2018-10-08 DIAGNOSIS — M5416 Radiculopathy, lumbar region: Secondary | ICD-10-CM | POA: Diagnosis not present

## 2018-10-09 DIAGNOSIS — J3089 Other allergic rhinitis: Secondary | ICD-10-CM | POA: Diagnosis not present

## 2018-10-09 DIAGNOSIS — J301 Allergic rhinitis due to pollen: Secondary | ICD-10-CM | POA: Diagnosis not present

## 2018-10-12 ENCOUNTER — Ambulatory Visit (INDEPENDENT_AMBULATORY_CARE_PROVIDER_SITE_OTHER): Payer: 59 | Admitting: Psychiatry

## 2018-10-12 ENCOUNTER — Encounter: Payer: Self-pay | Admitting: Psychiatry

## 2018-10-12 VITALS — BP 130/88 | HR 67 | Ht 69.5 in | Wt 210.0 lb

## 2018-10-12 DIAGNOSIS — E119 Type 2 diabetes mellitus without complications: Secondary | ICD-10-CM | POA: Diagnosis not present

## 2018-10-12 DIAGNOSIS — F3160 Bipolar disorder, current episode mixed, unspecified: Secondary | ICD-10-CM

## 2018-10-12 DIAGNOSIS — I10 Essential (primary) hypertension: Secondary | ICD-10-CM | POA: Diagnosis not present

## 2018-10-12 MED ORDER — BUPROPION HCL ER (SR) 100 MG PO TB12: 100.0000 mg | ORAL_TABLET | Freq: Every day | ORAL | 1 refills | Status: AC

## 2018-10-12 MED ORDER — ARIPIPRAZOLE 2 MG PO TABS: 2.0000 mg | ORAL_TABLET | Freq: Every day | ORAL | 1 refills | Status: DC

## 2018-10-12 MED ORDER — LITHIUM CARBONATE ER 450 MG PO TBCR: 450.0000 mg | EXTENDED_RELEASE_TABLET | Freq: Every day | ORAL | 1 refills | Status: AC

## 2018-10-12 NOTE — Progress Notes (Signed)
Psychiatric MD Progress Note   Patient Identification: Jeremiah [name] MRN:  [id number] Date of Evaluation:  10/12/2018 Referral Source: Dr. Bridgett Larsson Chief Complaint:    Visit Diagnosis:    ICD-9-CM ICD-10-CM   1. Bipolar I disorder, most recent episode depressed ([place]) 296.50 F31.30   2.       Diagnosis:   Patient Active Problem List   Diagnosis Date Noted  . Low testosterone [R79.89] 06/01/2018  . Degeneration of lumbar intervertebral disc [M51.36] 12/09/2017  . Benign neoplasm of cecum [D12.0]   . Left inguinal hernia [K40.90] 09/08/2017  . Right inguinal hernia [K40.90] 09/08/2017  . Umbilical hernia without obstruction and without gangrene [K42.9] 09/08/2017  . Allergic rhinitis due to pollen [J30.1] 06/23/2017  . Chest pain [R07.9] 03/05/2016  . Pedal edema [R60.0] 03/08/2015  . History of repair of rotator cuff [Z98.890] 02/17/2014  . Diabetes ([place]) [E11.9] 08/31/2013  . BP (high blood pressure) [I10] 08/31/2013  . Adult hypothyroidism [E03.9] 08/31/2013  . Obstructive apnea [G47.33] 08/31/2013  . Clinical depression [F32.9] 08/31/2013  . Diabetes mellitus ([place]) [E11.9] 08/31/2013  . Idiopathic localized osteoarthropathy [M19.91] 05/31/2013  . ABDOMINAL PAIN, LEFT UPPER QUADRANT [R10.12] 10/04/2009  . TARSAL TUNNEL SYNDROME, RIGHT [G57.50] 09/25/2009  . ANKLE PAIN, RIGHT [M25.579] 09/25/2009  . OTHER ANKLE SPRAIN AND STRAIN [X32.355D, S96.819A] 09/25/2009  . BREAST MASS, RIGHT [N63.0] 01/07/2008  . INSOMNIA [G47.00] 01/07/2008  . GASTRIC ULCER, ACUTE [K25.3] 11/09/2007  . DUODENITIS [K29.80] 11/09/2007  . BLOOD IN STOOL, OCCULT [R19.5] 11/09/2007  . PERSONAL HISTORY OF COLONIC POLYPS [Z86.010] 11/09/2007  . BACK PAIN, LUMBAR [M54.5] 10/12/2007  . HELICOBACTER PYLORI GASTRITIS [A04.8] 09/09/2007  . GASTRIC ULCER, ACUTE, HEMORRHAGE [K25.0] 09/09/2007  . Hiatal hernia [K44.9] 09/09/2007  . COLONIC POLYPS, ADENOMATOUS [D12.6] 08/19/2007  . INTERNAL HEMORRHOIDS [K64.8]  08/19/2007  . EXTERNAL HEMORRHOIDS WITHOUT MENTION COMP [K64.4] 08/07/2007  . Blood in stool [K92.1] 08/07/2007  . ABDOMINAL PAIN, LEFT LOWER QUADRANT [R10.32] 08/07/2007  . SINUSITIS, ACUTE [J01.90] 06/12/2007  . ABDOMINAL PAIN [R10.9] 06/12/2007  . DIABETES MELLITUS, TYPE II [E11.9] 03/19/2007  . GOUT [M10.9] 03/19/2007  . DISORDER, CIRCADIAN RHYTHM SLEEP, NONORGANIC [G47.20, F51.8] 03/19/2007  . Depression [F32.9] 03/19/2007  . OSTEOARTHROSIS, GENERALIZED, MULTIPLE SITES [M15.9] 03/19/2007   History of Present Illness:   Patient is a 58 -year-old married male who presented for Follow-up. He reported that he enjoyed  his winter break and Christmas holidays and spent most of the time at home.  He reported that he has been doing well and the Wellbutrin helped with his depressive symptoms.  He reported that he does not feel tired and sleepy and has been awake till 8:30 PM as he has to work early in the morning.  He reported that his mood symptoms has been improving.  He reported that he has been taking his medications as prescribed.  He reported that he has been going to the [place] for his annual checkup.  He got his labs done at the [facility].    She appeared calm and alert during the interview.  He reported that the medications are helping him and he does not want to change his medications at this time.  He currently denied having any suicidal homicidal ideations or plans.  We discussed about his insurance in detail.  Patient reported that he will find out about his [employer] insurance at the [facility].  He reported that he has good relationship with his family members.  No acute symptoms noted at this  time.     Past Medical History:  Past Medical History:  Diagnosis Date  . Anxiety   . Arthritis    bil knees  . Bipolar disorder ([place])   . Colon polyp Dec 2015   Dr Allen Norris  . Complication of anesthesia   . Depression    dx 2004  . Diabetes mellitus without complication ([place])    type ll   controlled by weight & diet  . GERD (gastroesophageal reflux disease)   . Gout   . H/O hiatal hernia    had surgery for that 2012  [facility]  . Heartburn   . Hypertension   . Hypothyroidism   . PONV (postoperative nausea and vomiting)   . Sleep apnea    dx 1.5 yr ago...study @ [facility], does not use CPAP    Past Surgical History:  Procedure Laterality Date  . APPENDECTOMY    . COLONOSCOPY  Dec 2015   Dr Allen Norris  . COLONOSCOPY WITH PROPOFOL N/A 11/25/2017   Procedure: COLONOSCOPY WITH PROPOFOL;  Surgeon: Lucilla Lame, MD;  Location: [facility] ENDOSCOPY;  Service: Endoscopy;  Laterality: N/A;  . EXCISIONAL HEMORRHOIDECTOMY  01/02/15  . FOOT SURGERY     right foot 2012  . HERNIA REPAIR     Left inguinal  . HERNIA REPAIR     hiatal hernia at [place]  . INGUINAL HERNIA REPAIR Left 10/31/2017   Procedure: OPEN LEFT INGUINAL RECURRENT HERNIA REPAIR;  Surgeon: Johnathan Hausen, MD;  Location: [place];  Service: General;  Laterality: Left;  [name] MANDIBLE SURGERY     1983  . REPLACEMENT TOTAL KNEE BILATERAL     patient had knee replacement to his right side in 2015 and right 2014, Revision of R knee 12/04/16  . ROTATOR CUFF REPAIR     x 3  on right shoulder  . SHOULDER ARTHROSCOPY WITH ROTATOR CUFF REPAIR AND SUBACROMIAL DECOMPRESSION Left 03/04/2013   Procedure: LEFT SHOULDER ARTHROSCOPY WITH ROTATOR CUFF REPAIR AND SUBACROMIAL DECOMPRESSION;  Surgeon: Marin Shutter, MD;  Location: [place];  Service: Orthopedics;  Laterality: Left;  . SHOULDER SURGERY     x 3 on left   Family History:  Family History  Problem Relation Age of Onset  . CAD Mother   . Arthritis Mother   . Angina Mother   . Diabetes Father   . Heart disease Father   . Colon polyps Father   . Heart attack Father   . Bipolar disorder Sister   . Asthma Sister   . Schizophrenia Sister   . Diabetes Maternal Grandmother   . Cancer Maternal Grandmother   . Heart attack Maternal Grandfather   . Stomach cancer Paternal  Grandmother   . Kidney disease Neg Hx   . Prostate cancer Neg Hx   . Other Neg Hx        hypogonadism   Social History:   Social History   Socioeconomic History  . Marital status: Married    Spouse name: Not on file  . Number of children: Not on file  . Years of education: Not on file  . Highest education level: Not on file  Occupational History  . Not on file  Social Needs  . Financial resource strain: Not on file  . Food insecurity:    Worry: Not on file    Inability: Not on file  . Transportation needs:    Medical: Not on file    Non-medical: Not on file  Tobacco Use  .  Smoking status: Never Smoker  . Smokeless tobacco: Former Systems developer    Types: Chew  Substance and Sexual Activity  . Alcohol use: Yes    Alcohol/week: 12.0 standard drinks    Types: 12 Cans of beer per week    Frequency: Never  . Drug use: No  . Sexual activity: Yes    Partners: Female    Birth control/protection: None  Lifestyle  . Physical activity:    Days per week: Not on file    Minutes per session: Not on file  . Stress: Not on file  Relationships  . Social connections:    Talks on phone: Not on file    Gets together: Not on file    Attends religious service: Not on file    Active member of club or organization: Not on file    Attends meetings of clubs or organizations: Not on file    Relationship status: Not on file  Other Topics Concern  . Not on file  Social History Narrative  . Not on file   Additional Social History:  Married x 4 years. This is his second marrigae. Works at [employer] in [employer] division.  Both have children from the first marriages. Patient drinks alcohol on a regular basis. He reported that this is lite alcohol and he does not feel that it is affecting his medication.  Musculoskeletal: Strength & Muscle Tone: within normal limits Gait & Station: normal Patient leans: N/A  Psychiatric Specialty Exam: Medication Refill   Anxiety  Symptoms include insomnia.     Insomnia     Review of Systems  Psychiatric/Behavioral: The patient has insomnia.     Blood pressure 130/88, pulse 67, height 5' 9.5" (1.765 m), weight 210 lb (95.3 kg).Body mass index is 30.57 kg/m.  General Appearance: Casual  Eye Contact:  Fair  Speech:  Clear and Coherent and Normal Rate  Volume:  Normal  Mood:  Anxious  Affect:  Congruent  Thought Process:  Coherent and Goal Directed  Orientation:  Full (Time, Place, and Person)  Thought Content:  WDL  Suicidal Thoughts:  No  Homicidal Thoughts:  No  Memory:  Immediate;   Fair  Judgement:  Fair  Insight:  Fair  Psychomotor Activity:  Normal  Concentration:  Fair  Recall:  [employer] [place]  Language: Fair  Akathisia:  No  Handed:  Right  AIMS (if indicated):    Assets:  Communication Skills Desire for Improvement Housing Intimacy Physical Health Social Support  ADL's:  Intact  Cognition: WNL  Sleep:  7-8   Is the patient at risk to self?  No. Has the patient been a risk to self in the past 6 months?  No. Has the patient been a risk to self within the distant past?  No. Is the patient a risk to others?  No. Has the patient been a risk to others in the past 6 months?  No. Has the patient been a risk to others within the distant past?  No.  Allergies:   Allergies  Allergen Reactions  . Tequin [Gatifloxacin] Anaphylaxis and Rash  . Amoxicillin Hives   Current Medications: Current Outpatient Medications  Medication Sig Dispense Refill  . allopurinol (ZYLOPRIM) 100 MG tablet TAKE 1 TABLET(100 MG) BY MOUTH DAILY 30 tablet 11  . amLODipine (NORVASC) 10 MG tablet Take 5 mg by mouth daily.    . ARIPiprazole (ABILIFY) 2 MG tablet Take 1 tablet (2 mg total) by mouth daily. 90 tablet 1  .  buPROPion (WELLBUTRIN SR) 100 MG 12 hr tablet Take 1 tablet (100 mg total) by mouth daily. 30 tablet 1  . EPIPEN 2-PAK 0.3 MG/0.3ML SOAJ injection     . fluticasone (FLONASE) 50 MCG/ACT nasal spray Place 2 sprays  into both nostrils daily. 16 g 1  . HYDROcodone-acetaminophen (NORCO) 10-325 MG tablet Take 1 tablet by mouth every 4 (four) hours as needed for severe pain. 20 tablet 0  . levothyroxine (SYNTHROID, LEVOTHROID) 25 MCG tablet TAKE 1 TABLET BY MOUTH DAILY BEFORE BREAKFAST 90 tablet 0  . lithium carbonate (ESKALITH) 450 MG CR tablet Take 1 tablet (450 mg total) by mouth daily. 90 tablet 1  . loratadine (CLARITIN) 10 MG tablet Take 10 mg by mouth daily.    . metFORMIN (GLUCOPHAGE) 1000 MG tablet Take 0.5 tablets (500 mg total) by mouth 2 (two) times daily with a meal. 60 tablet 12  . montelukast (SINGULAIR) 10 MG tablet TK 1 T PO QPM  2  . Multiple Vitamin (MULTIVITAMIN WITH MINERALS) TABS Take 1 tablet by mouth daily.    [name] omeprazole (PRILOSEC) 20 MG capsule Take 20 mg by mouth daily.    [name] zolpidem (AMBIEN) 10 MG tablet Take 1 tablet (10 mg total) by mouth at bedtime as needed for sleep. 30 tablet 1   No current facility-administered medications for this visit.     Previous Psychotropic Medications: Lithium Wellbutrin Abilify prozac zoloft celexa Depakote seroquel- could not function risperdal pristiq Lamotrigine Latuda belsorma Trazodone   Substance Abuse History in the last 12 months:  Yes.    Consequences of Substance Abuse: Negative NA  Medical Decision Making:  Review of Psycho-Social Stressors (1) and Review and summation of old records (2)  Treatment Plan Summary: Medication management     Discussed with patient at length about his medications treatment risks benefits and alternatives. Continue medications as prescribed   Wellbutrin 100 mg in the morning and he agreed with the plan. Continue lithium 450 mg at night.. He has prescription of Ambien 10  mg - no refill given at this time.is taking Ambien on a as needed basis.    Abilify 2 mg p.o. daily.  Discussed with him about the side effects the medication and he demonstrated understanding.  Follow-up in 1   month or earlier    More than 50% of the time spent in psychoeducation, counseling and coordination of care.    This note was generated in part or whole with voice recognition software. Voice regonition is usually quite accurate but there are transcription errors that can and very often do occur. I apologize for any typographical errors that were not detected and corrected. Rainey Pines, MD    1/6/20203:04 PM

## 2018-10-13 DIAGNOSIS — J301 Allergic rhinitis due to pollen: Secondary | ICD-10-CM | POA: Diagnosis not present

## 2018-10-13 DIAGNOSIS — J3089 Other allergic rhinitis: Secondary | ICD-10-CM | POA: Diagnosis not present

## 2018-10-14 DIAGNOSIS — M9903 Segmental and somatic dysfunction of lumbar region: Secondary | ICD-10-CM | POA: Diagnosis not present

## 2018-10-14 DIAGNOSIS — M62830 Muscle spasm of back: Secondary | ICD-10-CM | POA: Diagnosis not present

## 2018-10-14 DIAGNOSIS — M9905 Segmental and somatic dysfunction of pelvic region: Secondary | ICD-10-CM | POA: Diagnosis not present

## 2018-10-16 ENCOUNTER — Other Ambulatory Visit: Payer: Self-pay

## 2018-10-19 ENCOUNTER — Other Ambulatory Visit: Payer: Self-pay

## 2018-10-19 DIAGNOSIS — E349 Endocrine disorder, unspecified: Secondary | ICD-10-CM

## 2018-10-20 ENCOUNTER — Other Ambulatory Visit: Payer: 59

## 2018-10-20 DIAGNOSIS — E349 Endocrine disorder, unspecified: Secondary | ICD-10-CM

## 2018-10-21 ENCOUNTER — Other Ambulatory Visit: Payer: Self-pay

## 2018-10-21 DIAGNOSIS — E349 Endocrine disorder, unspecified: Secondary | ICD-10-CM

## 2018-10-21 DIAGNOSIS — N529 Male erectile dysfunction, unspecified: Secondary | ICD-10-CM

## 2018-10-21 DIAGNOSIS — N401 Enlarged prostate with lower urinary tract symptoms: Secondary | ICD-10-CM

## 2018-10-21 DIAGNOSIS — E291 Testicular hypofunction: Secondary | ICD-10-CM

## 2018-10-21 DIAGNOSIS — N138 Other obstructive and reflux uropathy: Secondary | ICD-10-CM

## 2018-10-21 LAB — TESTOSTERONE: Testosterone: 265 ng/dL (ref 264–916)

## 2018-10-22 ENCOUNTER — Ambulatory Visit (INDEPENDENT_AMBULATORY_CARE_PROVIDER_SITE_OTHER): Payer: 59 | Admitting: Family Medicine

## 2018-10-22 DIAGNOSIS — Z23 Encounter for immunization: Secondary | ICD-10-CM | POA: Diagnosis not present

## 2018-10-29 DIAGNOSIS — J301 Allergic rhinitis due to pollen: Secondary | ICD-10-CM | POA: Diagnosis not present

## 2018-10-29 DIAGNOSIS — J3089 Other allergic rhinitis: Secondary | ICD-10-CM | POA: Diagnosis not present

## 2018-11-05 DIAGNOSIS — J301 Allergic rhinitis due to pollen: Secondary | ICD-10-CM | POA: Diagnosis not present

## 2018-11-05 DIAGNOSIS — J3089 Other allergic rhinitis: Secondary | ICD-10-CM | POA: Diagnosis not present

## 2018-11-06 NOTE — Progress Notes (Signed)
Shingles vaccine only.

## 2018-11-11 ENCOUNTER — Ambulatory Visit: Payer: 59 | Admitting: Urology

## 2018-11-11 ENCOUNTER — Encounter: Payer: Self-pay | Admitting: Urology

## 2018-11-11 DIAGNOSIS — N529 Male erectile dysfunction, unspecified: Secondary | ICD-10-CM

## 2018-11-11 DIAGNOSIS — E291 Testicular hypofunction: Secondary | ICD-10-CM

## 2018-11-11 DIAGNOSIS — E349 Endocrine disorder, unspecified: Secondary | ICD-10-CM | POA: Diagnosis not present

## 2018-11-11 DIAGNOSIS — N401 Enlarged prostate with lower urinary tract symptoms: Secondary | ICD-10-CM | POA: Diagnosis not present

## 2018-11-11 DIAGNOSIS — N138 Other obstructive and reflux uropathy: Secondary | ICD-10-CM

## 2018-11-11 MED ORDER — TESTOSTERONE 5.5 MG/ACT NA GEL: 1.0000 | Freq: Three times a day (TID) | NASAL | 3 refills | Status: DC

## 2018-11-11 NOTE — Progress Notes (Signed)
11/11/2018 2:44 PM   Jeremiah [name] 02/04/1961 [id number]  Referring provider: Jerrol Banana., MD [address] [place], [place] [address]  Chief Complaint  Patient presents with  . Testosterone Deficiency    HPI: Patient is a 58 y.o. Caucasian male who returns today for the evaluation and management of his testosterone levels.   He reports of no bothersome urinary symptoms except for "little" post void dribbling. He tries to drink 100 oz of water a day. He does have sleep apnea and is in the process of treating it. His I PSS is 16/2.   He underwent Testopel insertion on August 20, 2018. His SHIM today is 9 indicating moderate ED. He states that his ED improves with increased testosterone. His most recent testosterone levels are 265 ng/dL on 10/20/2018.    IPSS    Row Name 11/11/18 1400         International Prostate Symptom Score   How often have you had the sensation of not emptying your bladder?  Less than half the time     How often have you had to urinate less than every two hours?  More than half the time     How often have you found you stopped and started again several times when you urinated?  Less than half the time     How often have you found it difficult to postpone urination?  More than half the time     How often have you had a weak urinary stream?  Less than half the time     How often have you had to strain to start urination?  Not at All     How many times did you typically get up at night to urinate?  2 Times     Total IPSS Score  16       Quality of Life due to urinary symptoms   If you were to spend the rest of your life with your urinary condition just the way it is now how would you feel about that?  Mostly Satisfied        Score:  1-7 Mild 8-19 Moderate 20-35 Severe  SHIM    Row Name 11/11/18 1401         SHIM: Over the last 6 months:   How do you rate your confidence that you could get and keep an erection?  Very Low      When you had erections with sexual stimulation, how often were your erections hard enough for penetration (entering your partner)?  A Few Times (much less than half the time)     During sexual intercourse, how often were you able to maintain your erection after you had penetrated (entered) your partner?  A Few Times (much less than half the time)     During sexual intercourse, how difficult was it to maintain your erection to completion of intercourse?  Very Difficult     When you attempted sexual intercourse, how often was it satisfactory for you?  A Few Times (much less than half the time)       SHIM Total Score   SHIM  9          PMH: Past Medical History:  Diagnosis Date  . Anxiety   . Arthritis    bil knees  . Bipolar disorder ([place])   . Colon polyp Dec 2015   Dr Allen Norris  . Complication of anesthesia   . Depression  dx 2004  . Diabetes mellitus without complication ([place])    type ll  controlled by weight & diet  . GERD (gastroesophageal reflux disease)   . Gout   . H/O hiatal hernia    had surgery for that 2012  [facility]  . Heartburn   . Hypertension   . Hypothyroidism   . PONV (postoperative nausea and vomiting)   . Sleep apnea    dx 1.5 yr ago...study @ [facility], does not use CPAP    Surgical History: Past Surgical History:  Procedure Laterality Date  . APPENDECTOMY    . COLONOSCOPY  Dec 2015   Dr Allen Norris  . COLONOSCOPY WITH PROPOFOL N/A 11/25/2017   Procedure: COLONOSCOPY WITH PROPOFOL;  Surgeon: Lucilla Lame, MD;  Location[facility] ENDOSCOPY;  Service: Endoscopy;  Laterality: N/A;  . EXCISIONAL HEMORRHOIDECTOMY  01/02/15  . FOOT SURGERY     right foot 2012  . HERNIA REPAIR     Left inguinal  . HERNIA REPAIR     hiatal hernia at [place]  . INGUINAL HERNIA REPAIR Left 10/31/2017   Procedure: OPEN LEFT INGUINAL RECURRENT HERNIA REPAIR;  Surgeon: Johnathan Hausen, MD;  Location: [place];  Service: General;  Laterality: Left;  [name] MANDIBLE SURGERY      1983  . REPLACEMENT TOTAL KNEE BILATERAL     patient had knee replacement to his right side in 2015 and right 2014, Revision of R knee 12/04/16  . ROTATOR CUFF REPAIR     x 3  on right shoulder  . SHOULDER ARTHROSCOPY WITH ROTATOR CUFF REPAIR AND SUBACROMIAL DECOMPRESSION Left 03/04/2013   Procedure: LEFT SHOULDER ARTHROSCOPY WITH ROTATOR CUFF REPAIR AND SUBACROMIAL DECOMPRESSION;  Surgeon: Marin Shutter, MD;  Location: [place];  Service: Orthopedics;  Laterality: Left;  . SHOULDER SURGERY     x 3 on left    Home Medications:  Allergies as of 11/11/2018      Reactions   Tequin [gatifloxacin] Anaphylaxis, Rash   Amoxicillin Hives      Medication List       Accurate as of November 11, 2018  2:44 PM. Always use your most recent med list.        allopurinol 100 MG tablet Commonly known as:  ZYLOPRIM TAKE 1 TABLET(100 MG) BY MOUTH DAILY   amLODipine 10 MG tablet Commonly known as:  NORVASC Take 5 mg by mouth daily.   ARIPiprazole 2 MG tablet Commonly known as:  ABILIFY Take 1 tablet (2 mg total) by mouth daily.   buPROPion 100 MG 12 hr tablet Commonly known as:  WELLBUTRIN SR Take 1 tablet (100 mg total) by mouth daily.   EPIPEN 2-PAK 0.3 mg/0.3 mL Soaj injection Generic drug:  EPINEPHrine   fluticasone 50 MCG/ACT nasal spray Commonly known as:  FLONASE Place 2 sprays into both nostrils daily.   HYDROcodone-acetaminophen 10-325 MG tablet Commonly known as:  NORCO Take 1 tablet by mouth every 4 (four) hours as needed for severe pain.   levothyroxine 25 MCG tablet Commonly known as:  SYNTHROID, LEVOTHROID TAKE 1 TABLET BY MOUTH DAILY BEFORE BREAKFAST   lithium carbonate 450 MG CR tablet Commonly known as:  ESKALITH Take 1 tablet (450 mg total) by mouth daily.   loratadine 10 MG tablet Commonly known as:  CLARITIN Take 10 mg by mouth daily.   metFORMIN 1000 MG tablet Commonly known as:  GLUCOPHAGE Take 0.5 tablets (500 mg total) by mouth 2 (two) times daily with a  meal.  montelukast 10 MG tablet Commonly known as:  SINGULAIR TK 1 T PO QPM   multivitamin with minerals Tabs tablet Take 1 tablet by mouth daily.   omeprazole 20 MG capsule Commonly known as:  PRILOSEC Take 20 mg by mouth daily.   Testosterone 5.5 MG/ACT Gel Commonly known as:  NATESTO Place 1 Pump into the nose 3 (three) times daily.   zolpidem 10 MG tablet Commonly known as:  AMBIEN Take 1 tablet (10 mg total) by mouth at bedtime as needed for sleep.       Allergies:  Allergies  Allergen Reactions  . Tequin [Gatifloxacin] Anaphylaxis and Rash  . Amoxicillin Hives    Family History: Family History  Problem Relation Age of Onset  . CAD Mother   . Arthritis Mother   . Angina Mother   . Diabetes Father   . Heart disease Father   . Colon polyps Father   . Heart attack Father   . Bipolar disorder Sister   . Asthma Sister   . Schizophrenia Sister   . Diabetes Maternal Grandmother   . Cancer Maternal Grandmother   . Heart attack Maternal Grandfather   . Stomach cancer Paternal Grandmother   . Kidney disease Neg Hx   . Prostate cancer Neg Hx   . Other Neg Hx        hypogonadism    Social History:  reports that he has never smoked. He quit smokeless tobacco use about 29 years ago.  His smokeless tobacco use included chew. He reports current alcohol use of about 12.0 standard drinks of alcohol per week. He reports that he does not use drugs.  ROS: UROLOGY Frequent Urination?: No Hard to postpone urination?: No Burning/pain with urination?: No Get up at night to urinate?: No Leakage of urine?: No Urine stream starts and stops?: No Trouble starting stream?: No Do you have to strain to urinate?: No Blood in urine?: No Urinary tract infection?: No Sexually transmitted disease?: No Injury to kidneys or bladder?: No Painful intercourse?: No Weak stream?: No Erection problems?: Yes Penile pain?: No  Gastrointestinal Nausea?: No Vomiting?:  No Indigestion/heartburn?: No Diarrhea?: No Constipation?: No  Constitutional Fever: No Night sweats?: No Weight loss?: No Fatigue?: Yes  Skin Skin rash/lesions?: No Itching?: No  Eyes Blurred vision?: No Double vision?: No  Ears/Nose/Throat Sore throat?: No Sinus problems?: No  Hematologic/Lymphatic Swollen glands?: No Easy bruising?: No  Cardiovascular Leg swelling?: No Chest pain?: No  Respiratory Cough?: No Shortness of breath?: Yes  Endocrine Excessive thirst?: No  Musculoskeletal Back pain?: No Joint pain?: No  Neurological Headaches?: No Dizziness?: No  Psychologic Depression?: Yes Anxiety?: No  Physical Exam: BP 124/88 (BP Location: Left Arm, Patient Position: Sitting)   Pulse 73   Ht 5\' 10"  (1.778 m)   Wt 210 lb (95.3 kg)   BMI 30.13 kg/m   Constitutional:  Well nourished. Alert and oriented, No acute distress. HEENT: [place] AT, moist mucus membranes.  Trachea midline, no masses. Cardiovascular: No clubbing, cyanosis, or edema. Respiratory: Normal respiratory effort, no increased work of breathing. GI: Abdomen is soft, non tender, non distended, no abdominal masses. Liver and spleen not palpable.  No hernias appreciated.  Stool sample for occult testing is not indicated.   GU: No CVA tenderness.  No bladder fullness or masses.  Patient with circumcised. Urethral meatus is patent.  No penile discharge. No penile lesions or rashes. Scrotum without lesions, cysts, rashes and/or edema.  Testicles are located scrotally bilaterally. No masses  are appreciated in the testicles. Left and right epididymis are normal. Rectal: Patient with  normal sphincter tone. Anus and perineum without scarring or rashes. No rectal masses are appreciated. Prostate is approximately 45 grams, no nodules are appreciated. Seminal vesicles are normal.  Skin: No rashes, bruises or suspicious lesions. Black head (sebum) on lower left hip.  Neurologic: Grossly intact, no focal  deficits, moving all 4 extremities. Psychiatric: Normal mood and affect.  Laboratory Data:  I have reviewed labs. See epic.   Assessment & Plan:    1.  Testosterone deficiency - Most recent testosterone levels are 265 ng/dL on 10/20/2018.  - Discussed options of AVEED, nasal spray or the topicals - Rx of [place] sent; pt agreeable to treatment   2. Erectile dysfunction - He notices better erections when testosterone treated  - Will reaccess after testosterone treatment   3. Urinary incontinence/Nocturia  - Not bothersome at this time  - PSA today - pending   4. Sebum gland hardened on left hip  - PE showed black head on left hip when doing rectal   Return for follow up pending labs .  These notes generated with voice recognition software. I apologize for typographical errors.  Laneta Simmers  [facility] [address]  [place], [place] [address] [phone]  I, Lucas Mallow, am acting as a Education administrator for [employer],  I have reviewed the above documentation for accuracy and completeness, and I agree with the above.    Zara Council, PA-C

## 2018-11-12 DIAGNOSIS — L820 Inflamed seborrheic keratosis: Secondary | ICD-10-CM | POA: Diagnosis not present

## 2018-11-12 DIAGNOSIS — L821 Other seborrheic keratosis: Secondary | ICD-10-CM | POA: Diagnosis not present

## 2018-11-12 LAB — HEMOGLOBIN: Hemoglobin: 16.7 g/dL (ref 13.0–17.7)

## 2018-11-12 LAB — PSA: Prostate Specific Ag, Serum: 1.1 ng/mL (ref 0.0–4.0)

## 2018-11-12 LAB — HEMATOCRIT: Hematocrit: 50.2 % (ref 37.5–51.0)

## 2018-11-13 DIAGNOSIS — M9903 Segmental and somatic dysfunction of lumbar region: Secondary | ICD-10-CM | POA: Diagnosis not present

## 2018-11-13 DIAGNOSIS — M9905 Segmental and somatic dysfunction of pelvic region: Secondary | ICD-10-CM | POA: Diagnosis not present

## 2018-11-13 DIAGNOSIS — M62830 Muscle spasm of back: Secondary | ICD-10-CM | POA: Diagnosis not present

## 2018-11-13 MED ORDER — TESTOSTERONE ENANTHATE 50 MG/0.5ML ~~LOC~~ SOAJ: 50.0000 mg | SUBCUTANEOUS | 4 refills | Status: DC

## 2018-11-16 ENCOUNTER — Ambulatory Visit: Payer: 59 | Admitting: Psychiatry

## 2018-11-16 DIAGNOSIS — M9905 Segmental and somatic dysfunction of pelvic region: Secondary | ICD-10-CM | POA: Diagnosis not present

## 2018-11-16 DIAGNOSIS — M62830 Muscle spasm of back: Secondary | ICD-10-CM | POA: Diagnosis not present

## 2018-11-16 DIAGNOSIS — M9903 Segmental and somatic dysfunction of lumbar region: Secondary | ICD-10-CM | POA: Diagnosis not present

## 2018-11-17 ENCOUNTER — Ambulatory Visit: Payer: 59 | Admitting: Family Medicine

## 2018-11-17 ENCOUNTER — Encounter: Payer: Self-pay | Admitting: Family Medicine

## 2018-11-17 VITALS — BP 124/88 | HR 97 | Temp 97.8°F | Resp 15 | Wt 212.6 lb

## 2018-11-17 DIAGNOSIS — S39012A Strain of muscle, fascia and tendon of lower back, initial encounter: Secondary | ICD-10-CM | POA: Diagnosis not present

## 2018-11-17 MED ORDER — MELOXICAM 7.5 MG PO TABS: 7.5000 mg | ORAL_TABLET | Freq: Every day | ORAL | 0 refills | Status: DC

## 2018-11-17 MED ORDER — CYCLOBENZAPRINE HCL 5 MG PO TABS: 5.0000 mg | ORAL_TABLET | Freq: Three times a day (TID) | ORAL | 0 refills | Status: AC | PRN

## 2018-11-17 NOTE — Patient Instructions (Signed)
May also use warm compresses for twenty minutes several day for back spasms. Let us know if not improving over the next 1-2 weeks.

## 2018-11-17 NOTE — Progress Notes (Signed)
  Subjective:     Patient ID: Jeremiah [name], male   DOB: 1961/09/27, 58 y.o.   MRN: [id number] Chief Complaint  Patient presents with  . Back Pain    Pt presents with back pain x 5 days. Pt is experiencing burning sensation in the lumbar region radiation down to buttock. Pt reports in the morning urine is darker in color but denies any buring with urination or frequency with urination.   HPI States he got out of bed and it "locked" up on him. Reports increased pain without radiation when changing positions. He has hx of Lumbar DDD and has rx for hydrocodone as needed.  Review of Systems     Objective:   Physical Exam Constitutional:      General: He is not in acute distress ( mild discomfort moving from sit to lie).    Appearance: Normal appearance.  Neurological:     Mental Status: He is alert.   Muscle strength in lower extremities 5/5. SLR's to 90 degrees without radiation of back pain. Localizes to his left paravertebral lumbar area. Non-tender to the touch.     Assessment:    1. Strain of lumbar region, initial encounter - cyclobenzaprine (FLEXERIL) 5 MG tablet; Take 1 tablet (5 mg total) by mouth 3 (three) times daily as needed for muscle spasms.  Dispense: 21 tablet; Refill: 0 - meloxicam (MOBIC) 7.5 MG tablet; Take 1 tablet (7.5 mg total) by mouth daily.  Dispense: 30 tablet; Refill: 0    Plan:    Discussed use of warm compresses and pain medication as needed.

## 2018-11-18 DIAGNOSIS — M62830 Muscle spasm of back: Secondary | ICD-10-CM | POA: Diagnosis not present

## 2018-11-18 DIAGNOSIS — M9905 Segmental and somatic dysfunction of pelvic region: Secondary | ICD-10-CM | POA: Diagnosis not present

## 2018-11-18 DIAGNOSIS — M9903 Segmental and somatic dysfunction of lumbar region: Secondary | ICD-10-CM | POA: Diagnosis not present

## 2018-11-19 DIAGNOSIS — J3089 Other allergic rhinitis: Secondary | ICD-10-CM | POA: Diagnosis not present

## 2018-11-19 DIAGNOSIS — J301 Allergic rhinitis due to pollen: Secondary | ICD-10-CM | POA: Diagnosis not present

## 2018-11-27 DIAGNOSIS — G4733 Obstructive sleep apnea (adult) (pediatric): Secondary | ICD-10-CM | POA: Diagnosis not present

## 2018-12-01 DIAGNOSIS — J3089 Other allergic rhinitis: Secondary | ICD-10-CM | POA: Diagnosis not present

## 2018-12-01 DIAGNOSIS — J301 Allergic rhinitis due to pollen: Secondary | ICD-10-CM | POA: Diagnosis not present

## 2018-12-03 DIAGNOSIS — G4733 Obstructive sleep apnea (adult) (pediatric): Secondary | ICD-10-CM | POA: Diagnosis not present

## 2018-12-10 DIAGNOSIS — G4733 Obstructive sleep apnea (adult) (pediatric): Secondary | ICD-10-CM | POA: Diagnosis not present

## 2018-12-15 ENCOUNTER — Other Ambulatory Visit: Payer: Self-pay

## 2018-12-15 ENCOUNTER — Other Ambulatory Visit: Payer: Self-pay | Admitting: Family Medicine

## 2018-12-15 ENCOUNTER — Ambulatory Visit (INDEPENDENT_AMBULATORY_CARE_PROVIDER_SITE_OTHER): Payer: 59 | Admitting: Family Medicine

## 2018-12-15 ENCOUNTER — Encounter: Payer: Self-pay | Admitting: Family Medicine

## 2018-12-15 VITALS — BP 128/88 | HR 68 | Temp 97.8°F | Ht 70.0 in | Wt 216.2 lb

## 2018-12-15 DIAGNOSIS — M25511 Pain in right shoulder: Secondary | ICD-10-CM

## 2018-12-15 DIAGNOSIS — E039 Hypothyroidism, unspecified: Secondary | ICD-10-CM

## 2018-12-15 DIAGNOSIS — Z0000 Encounter for general adult medical examination without abnormal findings: Secondary | ICD-10-CM

## 2018-12-15 DIAGNOSIS — Z96653 Presence of artificial knee joint, bilateral: Secondary | ICD-10-CM

## 2018-12-15 DIAGNOSIS — G4733 Obstructive sleep apnea (adult) (pediatric): Secondary | ICD-10-CM | POA: Diagnosis not present

## 2018-12-15 DIAGNOSIS — M25512 Pain in left shoulder: Secondary | ICD-10-CM

## 2018-12-15 DIAGNOSIS — Z125 Encounter for screening for malignant neoplasm of prostate: Secondary | ICD-10-CM | POA: Diagnosis not present

## 2018-12-15 DIAGNOSIS — S39012A Strain of muscle, fascia and tendon of lower back, initial encounter: Secondary | ICD-10-CM

## 2018-12-15 DIAGNOSIS — Z9989 Dependence on other enabling machines and devices: Secondary | ICD-10-CM

## 2018-12-15 DIAGNOSIS — G8929 Other chronic pain: Secondary | ICD-10-CM

## 2018-12-15 MED ORDER — MELOXICAM 7.5 MG PO TABS: 7.5000 mg | ORAL_TABLET | Freq: Every day | ORAL | 4 refills | Status: DC

## 2018-12-15 NOTE — Telephone Encounter (Signed)
[facility] faxed refill request for the following medications:  meloxicam (MOBIC) 7.5 MG tablet    Please advise.  

## 2018-12-15 NOTE — Progress Notes (Signed)
Patient: Jeremiah [name], Male    DOB: 04-04-1961, 58 y.o.   MRN: [id number] Visit Date: 12/15/2018  Today's Provider: [name], MD   Chief Complaint  Patient presents with  . Annual Exam   Subjective:     Annual physical exam Jeremiah [name] is a 58 y.o. male who presents today for health maintenance and complete physical. He feels fairly well. He reports exercising daily weight lifting. He reports he is sleeping fairly well.  -----------------------------------------------------------------   Review of Systems  Constitutional: Negative.   HENT: Negative.   Eyes: Negative.   Respiratory: Negative.   Cardiovascular: Negative.   Gastrointestinal: Negative.   Endocrine: Negative.   Genitourinary: Negative.   Musculoskeletal: Negative.   Skin: Negative.   Allergic/Immunologic: Negative.   Neurological: Negative.   Hematological: Negative.   Psychiatric/Behavioral: Negative.     Social History      He  reports that he has never smoked. He quit smokeless tobacco use about 29 years ago.  His smokeless tobacco use included chew. He reports current alcohol use of about 12.0 standard drinks of alcohol per week. He reports that he does not use drugs.       Social History   Socioeconomic History  . Marital status: Married    Spouse name: Not on file  . Number of children: Not on file  . Years of education: Not on file  . Highest education level: Not on file  Occupational History  . Not on file  Social Needs  . Financial resource strain: Not on file  . Food insecurity:    Worry: Not on file    Inability: Not on file  . Transportation needs:    Medical: Not on file    Non-medical: Not on file  Tobacco Use  . Smoking status: Never Smoker  . Smokeless tobacco: Former Systems developer    Types: Chew  Substance and Sexual Activity  . Alcohol use: Yes    Alcohol/week: 12.0 standard drinks    Types: 12 Cans of beer per week    Frequency: Never  . Drug use: No  .  Sexual activity: Yes    Partners: Female    Birth control/protection: None  Lifestyle  . Physical activity:    Days per week: Not on file    Minutes per session: Not on file  . Stress: Not on file  Relationships  . Social connections:    Talks on phone: Not on file    Gets together: Not on file    Attends religious service: Not on file    Active member of club or organization: Not on file    Attends meetings of clubs or organizations: Not on file    Relationship status: Not on file  Other Topics Concern  . Not on file  Social History Narrative  . Not on file    Past Medical History:  Diagnosis Date  . Anxiety   . Arthritis    bil knees  . Bipolar disorder ([place])   . Colon polyp Dec 2015   Dr Allen Norris  . Complication of anesthesia   . Depression    dx 2004  . Diabetes mellitus without complication ([place])    type ll  controlled by weight & diet  . GERD (gastroesophageal reflux disease)   . Gout   . H/O hiatal hernia    had surgery for that 2012  [facility]  . Heartburn   . Hypertension   .  Hypothyroidism   . PONV (postoperative nausea and vomiting)   . Sleep apnea    dx 1.5 yr ago...study @ [facility], does not use CPAP     Patient Active Problem List   Diagnosis Date Noted  . Low testosterone 06/01/2018  . Degeneration of lumbar intervertebral disc 12/09/2017  . Benign neoplasm of cecum   . Left inguinal hernia 09/08/2017  . Right inguinal hernia 09/08/2017  . Umbilical hernia without obstruction and without gangrene 09/08/2017  . Allergic rhinitis due to pollen 06/23/2017  . Chest pain 03/05/2016  . Pedal edema 03/08/2015  . History of repair of rotator cuff 02/17/2014  . Diabetes ([place]) 08/31/2013  . BP (high blood pressure) 08/31/2013  . Adult hypothyroidism 08/31/2013  . Obstructive apnea 08/31/2013  . Clinical depression 08/31/2013  . Diabetes mellitus ([place]) 08/31/2013  . Idiopathic localized osteoarthropathy 05/31/2013  . ABDOMINAL PAIN, LEFT UPPER  QUADRANT 10/04/2009  . TARSAL TUNNEL SYNDROME, RIGHT 09/25/2009  . ANKLE PAIN, RIGHT 09/25/2009  . OTHER ANKLE SPRAIN AND STRAIN 09/25/2009  . BREAST MASS, RIGHT 01/07/2008  . INSOMNIA 01/07/2008  . GASTRIC ULCER, ACUTE 11/09/2007  . DUODENITIS 11/09/2007  . BLOOD IN STOOL, OCCULT 11/09/2007  . PERSONAL HISTORY OF COLONIC POLYPS 11/09/2007  . BACK PAIN, LUMBAR 10/12/2007  . HELICOBACTER PYLORI GASTRITIS 09/09/2007  . GASTRIC ULCER, ACUTE, HEMORRHAGE 09/09/2007  . Hiatal hernia 09/09/2007  . COLONIC POLYPS, ADENOMATOUS 08/19/2007  . INTERNAL HEMORRHOIDS 08/19/2007  . EXTERNAL HEMORRHOIDS WITHOUT MENTION COMP 08/07/2007  . Blood in stool 08/07/2007  . ABDOMINAL PAIN, LEFT LOWER QUADRANT 08/07/2007  . SINUSITIS, ACUTE 06/12/2007  . ABDOMINAL PAIN 06/12/2007  . DIABETES MELLITUS, TYPE II 03/19/2007  . GOUT 03/19/2007  . DISORDER, CIRCADIAN RHYTHM SLEEP, NONORGANIC 03/19/2007  . Depression 03/19/2007  . OSTEOARTHROSIS, GENERALIZED, MULTIPLE SITES 03/19/2007    Past Surgical History:  Procedure Laterality Date  . APPENDECTOMY    . COLONOSCOPY  Dec 2015   Dr Allen Norris  . COLONOSCOPY WITH PROPOFOL N/A 11/25/2017   Procedure: COLONOSCOPY WITH PROPOFOL;  Surgeon: Lucilla Lame, MD;  Location[facility] END[facility]COPY;  Service: Endoscopy;  Laterality: N/A;  . EXCISIONAL HEMORRHOIDECTOMY  01/02/15  . FOOT SURGERY     right foot 2012  . HERNIA REPAIR     Left inguinal  . HERNIA REPAIR     hiatal hernia at [place]  . INGUINAL HERNIA REPAIR Left 10/31/2017   Procedure: OPEN LEFT INGUINAL RECURRENT HERNIA REPAIR;  Surgeon: Johnathan Hausen, MD;  Location: [facility];  Service: General;  Laterality: Left;  [name] MANDIBLE SURGERY     1983  . REPLACEMENT TOTAL KNEE BILATERAL     patient had knee replacement to his right side in 2015 and right 2014, Revision of R knee 12/04/16  . ROTATOR CUFF REPAIR     x 3  on right shoulder  . SHOULDER ARTHROSCOPY WITH ROTATOR CUFF REPAIR AND SUBACROMIAL  DECOMPRESSION Left 03/04/2013   Procedure: LEFT SHOULDER ARTHROSCOPY WITH ROTATOR CUFF REPAIR AND SUBACROMIAL DECOMPRESSION;  Surgeon: Marin Shutter, MD;  Location: [place];  Service: Orthopedics;  Laterality: Left;  . SHOULDER SURGERY     x 3 on left    Family History        Family Status  Relation Name Status  . Mother  Alive  . Father  Alive  . Sister  Alive  . MGM  Deceased  . MGF  Deceased  . PGM  Deceased  . Son  Alive  . PGF  Deceased  . Neg Hx  (Not Specified)        His family history includes Angina in his mother; Arthritis in his mother; Asthma in his sister; Bipolar disorder in his sister; CAD in his mother; Cancer in his maternal grandmother; Colon polyps in his father; Diabetes in his father and maternal grandmother; Heart attack in his father and maternal grandfather; Heart disease in his father; Schizophrenia in his sister; Stomach cancer in his paternal grandmother. There is no history of Kidney disease, Prostate cancer, or Other.      Allergies  Allergen Reactions  . Tequin [Gatifloxacin] Anaphylaxis and Rash  . Amoxicillin Hives     Current Outpatient Medications:  .  allopurinol (ZYLOPRIM) 100 MG tablet, TAKE 1 TABLET(100 MG) BY MOUTH DAILY, Disp: 30 tablet, Rfl: 11 .  amLODipine (NORVASC) 10 MG tablet, Take 5 mg by mouth daily., Disp: , Rfl:  .  ARIPiprazole (ABILIFY) 2 MG tablet, Take 1 tablet (2 mg total) by mouth daily., Disp: 90 tablet, Rfl: 1 .  buPROPion (WELLBUTRIN SR) 100 MG 12 hr tablet, Take 1 tablet (100 mg total) by mouth daily., Disp: 30 tablet, Rfl: 1 .  cyclobenzaprine (FLEXERIL) 5 MG tablet, Take 1 tablet (5 mg total) by mouth 3 (three) times daily as needed for muscle spasms., Disp: 21 tablet, Rfl: 0 .  EPIPEN 2-PAK 0.3 MG/0.3ML SOAJ injection, , Disp: , Rfl:  .  fluticasone (FLONASE) 50 MCG/ACT nasal spray, Place 2 sprays into both nostrils daily., Disp: 16 g, Rfl: 1 .  HYDROcodone-acetaminophen (NORCO) 10-325 MG tablet, Take 1 tablet by  mouth every 4 (four) hours as needed for severe pain., Disp: 20 tablet, Rfl: 0 .  levothyroxine (SYNTHROID, LEVOTHROID) 25 MCG tablet, TAKE 1 TABLET BY MOUTH DAILY BEFORE BREAKFAST, Disp: 90 tablet, Rfl: 0 .  lithium carbonate (ESKALITH) 450 MG CR tablet, Take 1 tablet (450 mg total) by mouth daily., Disp: 90 tablet, Rfl: 1 .  loratadine (CLARITIN) 10 MG tablet, Take 10 mg by mouth daily., Disp: , Rfl:  .  meloxicam (MOBIC) 7.5 MG tablet, Take 1 tablet (7.5 mg total) by mouth daily., Disp: 30 tablet, Rfl: 0 .  metFORMIN (GLUCOPHAGE) 1000 MG tablet, Take 0.5 tablets (500 mg total) by mouth 2 (two) times daily with a meal., Disp: 60 tablet, Rfl: 12 .  montelukast (SINGULAIR) 10 MG tablet, TK 1 T PO QPM, Disp: , Rfl: 2 .  Multiple Vitamin (MULTIVITAMIN WITH MINERALS) TABS, Take 1 tablet by mouth daily., Disp: , Rfl:  .  omeprazole (PRILOSEC) 20 MG capsule, Take 20 mg by mouth daily., Disp: , Rfl:  .  Testosterone Enanthate (XYOSTED) 50 MG/0.5ML SOAJ, Inject 50 mg into the skin every 7 (seven) days., Disp: 4 pen, Rfl: 4 .  zolpidem (AMBIEN) 10 MG tablet, Take 1 tablet (10 mg total) by mouth at bedtime as needed for sleep., Disp: 30 tablet, Rfl: 1   Patient Care Team: Jerrol Banana., MD as PCP - General (Unknown Physician Specialty) Jerrol Banana., MD (Family Medicine) Christene Lye, MD (General Surgery)    Objective:    Vitals: BP 128/88 (BP Location: Right Arm, Patient Position: Sitting, Cuff Size: Large)   Pulse 68   Temp 97.8 F (36.6 C) (Oral)   Ht 5\' 10"  (1.778 m)   Wt 216 lb 3.2 oz (98.1 kg)   SpO2 97%   BMI 31.02 kg/m    Vitals:   12/15/18 1026  BP: 128/88  Pulse: 68  Temp: 97.8 F (36.6 C)  TempSrc: Oral  SpO2: 97%  Weight: 216 lb 3.2 oz (98.1 kg)  Height: 5\' 10"  (1.778 m)     Physical Exam Vitals signs reviewed.  Constitutional:      Appearance: He is well-developed.  HENT:     Head: Normocephalic and atraumatic.     Right Ear: External  ear normal.     Left Ear: External ear normal.     Nose: Nose normal.  Eyes:     General: No scleral icterus.    Conjunctiva/sclera: Conjunctivae normal.     Pupils: Pupils are equal, round, and reactive to light.  Neck:     Thyroid: No thyromegaly.  Cardiovascular:     Rate and Rhythm: Normal rate and regular rhythm.     Heart sounds: Murmur present.     Comments: Soft early 2/6 systolic murmur. Pulmonary:     Effort: Pulmonary effort is normal.     Breath sounds: Normal breath sounds.  Abdominal:     Palpations: Abdomen is soft.  Musculoskeletal:        General: Deformity present.     Comments: He is unable to lift his left shoulder to any meaningful degree.  This is a chronic issue.  Skin:    General: Skin is warm and dry.  Neurological:     Mental Status: He is alert and oriented to person, place, and time.  Psychiatric:        Behavior: Behavior normal.        Thought Content: Thought content normal.        Judgment: Judgment normal.      Depression Screen PHQ 2/9 Scores 12/15/2018 09/23/2017 12/31/2016  PHQ - 2 Score 0 3 2  PHQ- 9 Score - 14 5       Assessment & Plan:     Routine Health Maintenance and Physical Exam  Exercise Activities and Dietary recommendations Goals   None     Immunization History  Administered Date(s) Administered  . Influenza, Seasonal, Injecte, Preservative Fre 07/01/2017  . Influenza,inj,Quad PF,6+ Mos 08/04/2018  . Influenza,inj,quad, With Preservative 08/27/2017  . Td 02/22/2003  . Tdap 07/02/2011  . Zoster Recombinat (Shingrix) 10/22/2018    Health Maintenance  Topic Date Due  . PNEUMOCOCCAL POLYSACCHARIDE VACCINE AGE 65-64 HIGH RISK  11/29/1960  . OPHTHALMOLOGY EXAM  11/29/1968  . HIV Screening  11/29/1973  . HEMOGLOBIN A1C  12/02/2018  . FOOT EXAM  03/17/2019  . URINE MICROALBUMIN  03/17/2019  . TETANUS/TDAP  07/01/2021  . COLONOSCOPY  11/25/2022  . INFLUENZA VACCINE  Completed  . Hepatitis C Screening   Completed     Discussed health benefits of physical activity, and encouraged him to engage in regular exercise appropriate for his age and condition.  1. Annual physical exam  - CBC with Differential/Platelet - PSA - TSH - Comprehensive metabolic panel - Lipid panel  2. Adult hypothyroidism  - TSH  3. Prostate cancer screening  - PSA  4. OSA on CPAP CPAP nightly.  5. Chronic pain of both shoulders Followed by orthopedics  6. S/P TKR (total knee replacement), bilateral Well since surgery.  I have done the exam and reviewed the chart and it is accurate to the best of my knowledge. Development worker, community has been used and  any errors in dictation or transcription are unintentional. Miguel Aschoff M.D. [facility]    --------------------------------------------------------------------    [name], MD  [facility]  [facility]

## 2018-12-15 NOTE — Telephone Encounter (Signed)
Please review. Thanks!  

## 2018-12-16 ENCOUNTER — Telehealth: Payer: Self-pay

## 2018-12-16 DIAGNOSIS — M62830 Muscle spasm of back: Secondary | ICD-10-CM | POA: Diagnosis not present

## 2018-12-16 DIAGNOSIS — M9903 Segmental and somatic dysfunction of lumbar region: Secondary | ICD-10-CM | POA: Diagnosis not present

## 2018-12-16 DIAGNOSIS — M9905 Segmental and somatic dysfunction of pelvic region: Secondary | ICD-10-CM | POA: Diagnosis not present

## 2018-12-16 LAB — LIPID PANEL
Chol/HDL Ratio: 6.7 ratio — ABNORMAL HIGH (ref 0.0–5.0)
Cholesterol, Total: 182 mg/dL (ref 100–199)
HDL: 27 mg/dL — ABNORMAL LOW (ref >39)
LDL Calculated: 115 mg/dL — ABNORMAL HIGH (ref 0–99)
Triglycerides: 198 mg/dL — ABNORMAL HIGH (ref 0–149)
VLDL Cholesterol Cal: 40 mg/dL (ref 5–40)

## 2018-12-16 LAB — COMPREHENSIVE METABOLIC PANEL
ALT: 41 IU/L (ref 0–44)
AST: 44 IU/L — ABNORMAL HIGH (ref 0–40)
Albumin/Globulin Ratio: 1.6 (ref 1.2–2.2)
Albumin: 4.6 g/dL (ref 3.8–4.9)
Alkaline Phosphatase: 42 IU/L (ref 39–117)
BUN/Creatinine Ratio: 13 (ref 10–24)
BUN: 15 mg/dL (ref 8–27)
Bilirubin Total: 0.5 mg/dL (ref 0.0–1.2)
CO2: 19 mmol/L — ABNORMAL LOW (ref 20–29)
Calcium: 9.5 mg/dL (ref 8.6–10.2)
Chloride: 103 mmol/L (ref 96–106)
Creatinine, Ser: 1.16 mg/dL (ref 0.76–1.27)
GFR calc Af Amer: 79 mL/min/{1.73_m2} (ref >59)
GFR calc non Af Amer: 68 mL/min/{1.73_m2} (ref >59)
Globulin, Total: 2.8 g/dL (ref 1.5–4.5)
Glucose: 131 mg/dL — ABNORMAL HIGH (ref 65–99)
Potassium: 5 mmol/L (ref 3.5–5.2)
Sodium: 137 mmol/L (ref 134–144)
Total Protein: 7.4 g/dL (ref 6.0–8.5)

## 2018-12-16 LAB — CBC WITH DIFFERENTIAL/PLATELET
Basophils Absolute: 0 10*3/uL (ref 0.0–0.2)
Basos: 1 %
EOS (ABSOLUTE): 0.1 10*3/uL (ref 0.0–0.4)
Eos: 2 %
Hematocrit: 47.3 % (ref 37.5–51.0)
Hemoglobin: 16.7 g/dL (ref 13.0–17.7)
Immature Grans (Abs): 0 10*3/uL (ref 0.0–0.1)
Immature Granulocytes: 0 %
Lymphocytes Absolute: 1.2 10*3/uL (ref 0.7–3.1)
Lymphs: 23 %
MCH: 31.5 pg (ref 26.6–33.0)
MCHC: 35.3 g/dL (ref 31.5–35.7)
MCV: 89 fL (ref 79–97)
Monocytes Absolute: 0.6 10*3/uL (ref 0.1–0.9)
Monocytes: 11 %
Neutrophils Absolute: 3.3 10*3/uL (ref 1.4–7.0)
Neutrophils: 63 %
Platelets: 253 10*3/uL (ref 150–450)
RBC: 5.3 x10E6/uL (ref 4.14–5.80)
RDW: 13.7 % (ref 11.6–15.4)
WBC: 5.3 10*3/uL (ref 3.4–10.8)

## 2018-12-16 LAB — TSH: TSH: 1.19 u[IU]/mL (ref 0.450–4.500)

## 2018-12-16 LAB — PSA: Prostate Specific Ag, Serum: 0.9 ng/mL (ref 0.0–4.0)

## 2018-12-16 NOTE — Telephone Encounter (Signed)
Pt.Advised. KW 

## 2018-12-16 NOTE — Telephone Encounter (Signed)
-----   Message from Jerrol Banana., MD sent at 12/16/2018  8:32 AM EDT ----- Labs stable.

## 2018-12-17 DIAGNOSIS — J3089 Other allergic rhinitis: Secondary | ICD-10-CM | POA: Diagnosis not present

## 2018-12-17 DIAGNOSIS — J301 Allergic rhinitis due to pollen: Secondary | ICD-10-CM | POA: Diagnosis not present

## 2018-12-22 ENCOUNTER — Ambulatory Visit: Payer: Self-pay | Admitting: Family Medicine

## 2018-12-23 ENCOUNTER — Other Ambulatory Visit: Payer: Self-pay

## 2018-12-23 ENCOUNTER — Ambulatory Visit (INDEPENDENT_AMBULATORY_CARE_PROVIDER_SITE_OTHER): Payer: 59 | Admitting: Family Medicine

## 2018-12-23 DIAGNOSIS — Z23 Encounter for immunization: Secondary | ICD-10-CM | POA: Diagnosis not present

## 2018-12-25 NOTE — Progress Notes (Signed)
vaccine 

## 2018-12-29 DIAGNOSIS — J3089 Other allergic rhinitis: Secondary | ICD-10-CM | POA: Diagnosis not present

## 2018-12-29 DIAGNOSIS — J301 Allergic rhinitis due to pollen: Secondary | ICD-10-CM | POA: Diagnosis not present

## 2018-12-30 ENCOUNTER — Telehealth: Payer: Self-pay | Admitting: Urology

## 2018-12-30 NOTE — Telephone Encounter (Signed)
LMOM for patient to return call and have a lab visit only for Testosterone draw

## 2018-12-30 NOTE — Telephone Encounter (Signed)
Please assess pt's risk for coming in and advise. Thanks

## 2018-12-30 NOTE — Telephone Encounter (Signed)
Pt called office and I scheduled lab visit for testosterone.

## 2018-12-30 NOTE — Telephone Encounter (Signed)
Pt states that he just finished his 4th injection of Testosterone and needs a Lab appt. Please advise.

## 2018-12-30 NOTE — Telephone Encounter (Signed)
He will need to come in for a testosterone level check.

## 2018-12-31 ENCOUNTER — Other Ambulatory Visit: Payer: Self-pay

## 2018-12-31 DIAGNOSIS — E349 Endocrine disorder, unspecified: Secondary | ICD-10-CM

## 2018-12-31 NOTE — Telephone Encounter (Signed)
ERROR

## 2019-01-01 ENCOUNTER — Other Ambulatory Visit: Payer: Self-pay

## 2019-01-01 ENCOUNTER — Other Ambulatory Visit (INDEPENDENT_AMBULATORY_CARE_PROVIDER_SITE_OTHER): Payer: 59

## 2019-01-01 DIAGNOSIS — E349 Endocrine disorder, unspecified: Secondary | ICD-10-CM

## 2019-01-01 NOTE — Progress Notes (Signed)
Blood Drawn

## 2019-01-02 LAB — TESTOSTERONE: Testosterone: 331 ng/dL (ref 264–916)

## 2019-01-05 ENCOUNTER — Other Ambulatory Visit: Payer: Self-pay | Admitting: Urology

## 2019-01-05 MED ORDER — TESTOSTERONE ENANTHATE 75 MG/0.5ML ~~LOC~~ SOAJ: 75.0000 mg | SUBCUTANEOUS | 3 refills | Status: DC

## 2019-01-05 NOTE — Progress Notes (Unsigned)
I have increased Jeremiah [name]'s testosterone to 75 mg every week.  He will need to have his testosterone rechecked in 4 weeks.

## 2019-01-14 DIAGNOSIS — J301 Allergic rhinitis due to pollen: Secondary | ICD-10-CM | POA: Diagnosis not present

## 2019-01-14 DIAGNOSIS — J3089 Other allergic rhinitis: Secondary | ICD-10-CM | POA: Diagnosis not present

## 2019-01-18 ENCOUNTER — Other Ambulatory Visit: Payer: Self-pay | Admitting: Urology

## 2019-01-18 MED ORDER — TESTOSTERONE ENANTHATE 75 MG/0.5ML ~~LOC~~ SOAJ: 75.0000 mg | SUBCUTANEOUS | 3 refills | Status: DC

## 2019-01-28 DIAGNOSIS — J301 Allergic rhinitis due to pollen: Secondary | ICD-10-CM | POA: Diagnosis not present

## 2019-01-28 DIAGNOSIS — J3089 Other allergic rhinitis: Secondary | ICD-10-CM | POA: Diagnosis not present

## 2019-02-02 ENCOUNTER — Other Ambulatory Visit: Payer: Self-pay

## 2019-02-04 DIAGNOSIS — J301 Allergic rhinitis due to pollen: Secondary | ICD-10-CM | POA: Diagnosis not present

## 2019-02-04 DIAGNOSIS — J3089 Other allergic rhinitis: Secondary | ICD-10-CM | POA: Diagnosis not present

## 2019-02-12 DIAGNOSIS — J301 Allergic rhinitis due to pollen: Secondary | ICD-10-CM | POA: Diagnosis not present

## 2019-02-12 DIAGNOSIS — J3089 Other allergic rhinitis: Secondary | ICD-10-CM | POA: Diagnosis not present

## 2019-02-25 ENCOUNTER — Other Ambulatory Visit: Payer: 59

## 2019-02-25 ENCOUNTER — Other Ambulatory Visit: Payer: Self-pay

## 2019-02-25 DIAGNOSIS — E349 Endocrine disorder, unspecified: Secondary | ICD-10-CM

## 2019-02-26 ENCOUNTER — Telehealth: Payer: Self-pay | Admitting: Urology

## 2019-02-26 LAB — TESTOSTERONE: Testosterone: 486 ng/dL (ref 264–916)

## 2019-02-26 NOTE — Telephone Encounter (Signed)
Please call Jeremiah [name] and have him schedule an appointment in September with PSA, testosterone, HCT and hemoglobin prior to appointment.

## 2019-04-27 ENCOUNTER — Telehealth: Payer: Self-pay | Admitting: Family Medicine

## 2019-04-27 DIAGNOSIS — Z20822 Contact with and (suspected) exposure to covid-19: Secondary | ICD-10-CM

## 2019-04-27 DIAGNOSIS — Z20828 Contact with and (suspected) exposure to other viral communicable diseases: Secondary | ICD-10-CM

## 2019-04-27 NOTE — Telephone Encounter (Signed)
Ok to order test.

## 2019-04-27 NOTE — Telephone Encounter (Signed)
Test ordered. Patient was advised.

## 2019-04-27 NOTE — Telephone Encounter (Signed)
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 Pt was around grandson this weekend, outside swimming, not in close contact. Pt wants to know if he needs to be tested or what he needs to do?  Please advise.  Thanks, [employer]

## 2019-06-17 ENCOUNTER — Ambulatory Visit: Payer: 59 | Admitting: Family Medicine

## 2019-06-17 ENCOUNTER — Other Ambulatory Visit: Payer: Self-pay

## 2019-06-17 ENCOUNTER — Encounter: Payer: Self-pay | Admitting: Family Medicine

## 2019-06-17 VITALS — BP 126/80 | HR 86 | Temp 98.5°F | Resp 16 | Ht 70.0 in | Wt 206.0 lb

## 2019-06-17 DIAGNOSIS — Z23 Encounter for immunization: Secondary | ICD-10-CM | POA: Diagnosis not present

## 2019-06-17 DIAGNOSIS — E039 Hypothyroidism, unspecified: Secondary | ICD-10-CM | POA: Diagnosis not present

## 2019-06-17 DIAGNOSIS — E119 Type 2 diabetes mellitus without complications: Secondary | ICD-10-CM

## 2019-06-17 DIAGNOSIS — R7989 Other specified abnormal findings of blood chemistry: Secondary | ICD-10-CM

## 2019-06-17 DIAGNOSIS — F319 Bipolar disorder, unspecified: Secondary | ICD-10-CM

## 2019-06-17 DIAGNOSIS — R7309 Other abnormal glucose: Secondary | ICD-10-CM

## 2019-06-17 LAB — POCT GLYCOSYLATED HEMOGLOBIN (HGB A1C): Hemoglobin A1C: 11.9 % — AB (ref 4.0–5.6)

## 2019-06-17 MED ORDER — METFORMIN HCL 1000 MG PO TABS: 1000.0000 mg | ORAL_TABLET | Freq: Two times a day (BID) | ORAL | 12 refills | Status: DC

## 2019-06-17 NOTE — Patient Instructions (Signed)
Check fasting blood sugar daily.

## 2019-06-17 NOTE — Progress Notes (Signed)
Patient: Jeremiah [name] Male    DOB: November 27, 1960   58 y.o.   MRN: [id number] Visit Date: 06/17/2019  Today's Provider: [name], MD   Chief Complaint  Patient presents with  . Diabetes  . Hypothyroidism  . Depression   Subjective:   HPI  Diabetes Mellitus Type II, Follow-up:   Lab Results  Component Value Date   HGBA1C 11.9 (A) 06/17/2019   HGBA1C 6.5 (A) 06/01/2018   HGBA1C 7.1 (A) 03/16/2018    Last seen for diabetes 6 months ago.  Management since then includes no changes. He reports good compliance with treatment. He is not having side effects.  Current symptoms include none and have been stable. Home blood sugar records: fasting range: not being checked  Episodes of hypoglycemia? no   Current insulin regiment: Is not on insulin Most Recent Eye Exam: up to date Weight trend: stable Prior visit with dietician: No Current exercise: walking Current diet habits: well balanced  Pertinent Labs:    Component Value Date/Time   CHOL 182 12/15/2018 1144   TRIG 198 (H) 12/15/2018 1144   HDL 27 (L) 12/15/2018 1144   LDLCALC 115 (H) 12/15/2018 1144   CREATININE 1.16 12/15/2018 1144   CREATININE 1.59 (H) 09/23/2017 1433    Wt Readings from Last 3 Encounters:  06/17/19 206 lb (93.4 kg)  12/15/18 216 lb 3.2 oz (98.1 kg)  11/17/18 212 lb 9.6 oz (96.4 kg)   Hypothyroidism, follow up: Patient was last seen in the office 6 months ago. No medications were changed since last visit. Lab Results  Component Value Date   TSH 1.190 12/15/2018    Allergies  Allergen Reactions  . Tequin [Gatifloxacin] Anaphylaxis and Rash  . Amoxicillin Hives     Current Outpatient Medications:  .  allopurinol (ZYLOPRIM) 100 MG tablet, TAKE 1 TABLET(100 MG) BY MOUTH DAILY, Disp: 30 tablet, Rfl: 11 .  amLODipine (NORVASC) 10 MG tablet, Take 5 mg by mouth daily., Disp: , Rfl:  .  ARIPiprazole (ABILIFY) 2 MG tablet, Take 1 tablet (2 mg total) by mouth daily., Disp: 90  tablet, Rfl: 1 .  buPROPion (WELLBUTRIN SR) 100 MG 12 hr tablet, Take 1 tablet (100 mg total) by mouth daily., Disp: 30 tablet, Rfl: 1 .  cyclobenzaprine (FLEXERIL) 5 MG tablet, Take 1 tablet (5 mg total) by mouth 3 (three) times daily as needed for muscle spasms., Disp: 21 tablet, Rfl: 0 .  EPIPEN 2-PAK 0.3 MG/0.3ML SOAJ injection, , Disp: , Rfl:  .  fluticasone (FLONASE) 50 MCG/ACT nasal spray, Place 2 sprays into both nostrils daily., Disp: 16 g, Rfl: 1 .  HYDROcodone-acetaminophen (NORCO) 10-325 MG tablet, Take 1 tablet by mouth every 4 (four) hours as needed for severe pain., Disp: 20 tablet, Rfl: 0 .  levothyroxine (SYNTHROID, LEVOTHROID) 25 MCG tablet, TAKE 1 TABLET BY MOUTH DAILY BEFORE BREAKFAST, Disp: 90 tablet, Rfl: 3 .  lithium carbonate (ESKALITH) 450 MG CR tablet, Take 1 tablet (450 mg total) by mouth daily., Disp: 90 tablet, Rfl: 1 .  loratadine (CLARITIN) 10 MG tablet, Take 10 mg by mouth daily., Disp: , Rfl:  .  meloxicam (MOBIC) 7.5 MG tablet, Take 1 tablet (7.5 mg total) by mouth daily., Disp: 30 tablet, Rfl: 4 .  metFORMIN (GLUCOPHAGE) 1000 MG tablet, Take 0.5 tablets (500 mg total) by mouth 2 (two) times daily with a meal., Disp: 60 tablet, Rfl: 12 .  montelukast (SINGULAIR) 10 MG tablet, TK 1 T  PO QPM, Disp: , Rfl: 2 .  Multiple Vitamin (MULTIVITAMIN WITH MINERALS) TABS, Take 1 tablet by mouth daily., Disp: , Rfl:  .  omeprazole (PRILOSEC) 20 MG capsule, Take 20 mg by mouth daily., Disp: , Rfl:  .  Testosterone Enanthate (XYOSTED) 75 MG/0.5ML SOAJ, once a week., Disp: , Rfl:  .  zolpidem (AMBIEN) 10 MG tablet, Take 1 tablet (10 mg total) by mouth at bedtime as needed for sleep., Disp: 30 tablet, Rfl: 1 .  Testosterone Enanthate (XYOSTED) 75 MG/0.5ML SOAJ, Inject 75 mg into the skin every 7 (seven) days., Disp: 4 pen, Rfl: 3  Review of Systems  Constitutional: Negative for activity change, appetite change, chills, diaphoresis, fatigue, fever and unexpected weight change.   Eyes: Negative.   Respiratory: Negative for cough and shortness of breath.   Cardiovascular: Negative for chest pain, palpitations and leg swelling.  Gastrointestinal: Negative.   Endocrine: Negative for cold intolerance, heat intolerance, polydipsia, polyphagia and polyuria.  Musculoskeletal: Negative for joint swelling.  Allergic/Immunologic: Negative.   Neurological: Negative for dizziness, light-headedness and headaches.  Psychiatric/Behavioral: Negative for agitation, self-injury, sleep disturbance and suicidal ideas. The patient is not nervous/anxious and is not hyperactive.     Social History   Tobacco Use  . Smoking status: Never Smoker  . Smokeless tobacco: Former Systems developer    Types: Chew  Substance Use Topics  . Alcohol use: Yes    Alcohol/week: 12.0 standard drinks    Types: 12 Cans of beer per week    Frequency: Never      Objective:   BP 126/80   Pulse 86   Temp 98.5 F (36.9 C)   Resp 16   Ht 5\' 10"  (1.778 m)   Wt 206 lb (93.4 kg)   SpO2 96%   BMI 29.56 kg/m  Vitals:   06/17/19 1504  BP: 126/80  Pulse: 86  Resp: 16  Temp: 98.5 F (36.9 C)  SpO2: 96%  Weight: 206 lb (93.4 kg)  Height: 5\' 10"  (1.778 m)  Body mass index is 29.56 kg/m.   Physical Exam Vitals signs reviewed.  Constitutional:      Appearance: He is well-developed.  HENT:     Head: Normocephalic and atraumatic.     Right Ear: External ear normal.     Left Ear: External ear normal.     Nose: Nose normal.  Eyes:     General: No scleral icterus.    Conjunctiva/sclera: Conjunctivae normal.     Pupils: Pupils are equal, round, and reactive to light.  Neck:     Thyroid: No thyromegaly.  Cardiovascular:     Rate and Rhythm: Normal rate and regular rhythm.     Heart sounds: Normal heart sounds.  Pulmonary:     Effort: Pulmonary effort is normal.     Breath sounds: Normal breath sounds.  Abdominal:     Palpations: Abdomen is soft.  Skin:    General: Skin is warm and dry.   Neurological:     Mental Status: He is alert and oriented to person, place, and time.  Psychiatric:        Behavior: Behavior normal.        Thought Content: Thought content normal.        Judgment: Judgment normal.      Results for orders placed or performed in visit on 06/17/19  POCT glycosylated hemoglobin (Hb A1C)  Result Value Ref Range   Hemoglobin A1C 11.9 (A) 4.0 - 5.6 %   HbA1c  POC (<> result, manual entry)     HbA1c, POC (prediabetic range)     HbA1c, POC (controlled diabetic range)         Assessment & Plan    1. Flu vaccine need  - Flu Vaccine QUAD 6+ mos PF IM (Fluarix Quad PF)  2. Adult hypothyroidism   3. Diabetes mellitus without complication (HCC) Poor control==pt has not been checking BS at home--check FBS and work on lifestyle--RTC 21months. - POCT glycosylated hemoglobin (Hb A1C)--11.9 - metFORMIN (GLUCOPHAGE) 1000 MG tablet; Take 1 tablet (1,000 mg total) by mouth 2 (two) times daily with a meal.  Dispense: 60 tablet; Refill: 12  4. Elevated glucose  - metFORMIN (GLUCOPHAGE) 1000 MG tablet; Take 1 tablet (1,000 mg total) by mouth 2 (two) times daily with a meal.  Dispense: 60 tablet; Refill: 12  5. Low testosterone   6. Bipolar depression ([place]) Controlled. More than 50% 25 minute visit spent in counseling or coordination of care      [name], MD  [facility]

## 2019-06-21 ENCOUNTER — Other Ambulatory Visit: Payer: Self-pay

## 2019-06-21 DIAGNOSIS — E349 Endocrine disorder, unspecified: Secondary | ICD-10-CM

## 2019-06-21 DIAGNOSIS — N401 Enlarged prostate with lower urinary tract symptoms: Secondary | ICD-10-CM

## 2019-06-21 DIAGNOSIS — N138 Other obstructive and reflux uropathy: Secondary | ICD-10-CM

## 2019-06-22 ENCOUNTER — Other Ambulatory Visit: Payer: Self-pay

## 2019-06-22 ENCOUNTER — Other Ambulatory Visit: Payer: 59

## 2019-06-22 DIAGNOSIS — N138 Other obstructive and reflux uropathy: Secondary | ICD-10-CM

## 2019-06-22 DIAGNOSIS — E349 Endocrine disorder, unspecified: Secondary | ICD-10-CM

## 2019-06-23 LAB — PSA: Prostate Specific Ag, Serum: 0.8 ng/mL (ref 0.0–4.0)

## 2019-06-23 LAB — HEMATOCRIT: Hematocrit: 55.1 % — ABNORMAL HIGH (ref 37.5–51.0)

## 2019-06-23 LAB — HEMOGLOBIN: Hemoglobin: 18.9 g/dL — ABNORMAL HIGH (ref 13.0–17.7)

## 2019-06-23 LAB — TESTOSTERONE: Testosterone: 508 ng/dL (ref 264–916)

## 2019-06-24 ENCOUNTER — Ambulatory Visit: Payer: 59 | Admitting: Urology

## 2019-06-28 NOTE — Progress Notes (Signed)
06/29/2019 3:49 PM   Jeremiah [name] 07/06/1961 [id number]  Referring provider: Jerrol Banana., MD [address] [place],  [place] [address]  Chief Complaint  Patient presents with  . Hypogonadism    HPI: Patient is a 58 y.o. male with testosterone deficiency, BPH with LU TS and ED who returns today for follow up visit.    Testosterone deficiency He is still having spontaneous erections at night.  He has sleep apnea and is sleeping with a CPAP machine.  He is currently managing his hypogonadism with testosterone cypionate 200 mg/IM, 0.75 mg every week.  His current testosterone level is 508 ng/dL on 06/22/2019.  His HCT is 55.1% and hemoglobin is 18.9.    BPH WITH LUTS  (prostate and/or bladder) IPSS score: 11/2   Previous score: 16/2     Major complaint(s):  None.  Denies any dysuria, hematuria or suprapubic pain.   Denies any recent fevers, chills, nausea or vomiting.    He does not have a family history of PCa.  IPSS    Row Name 06/29/19 1500         International Prostate Symptom Score   How often have you had the sensation of not emptying your bladder?  Less than half the time     How often have you had to urinate less than every two hours?  Less than half the time     How often have you found you stopped and started again several times when you urinated?  Less than 1 in 5 times     How often have you found it difficult to postpone urination?  About half the time     How often have you had a weak urinary stream?  Less than 1 in 5 times     How often have you had to strain to start urination?  Less than 1 in 5 times     How many times did you typically get up at night to urinate?  1 Time     Total IPSS Score  11       Quality of Life due to urinary symptoms   If you were to spend the rest of your life with your urinary condition just the way it is now how would you feel about that?  Mostly Satisfied        Score:  1-7 Mild 8-19 Moderate 20-35  Severe  Erectile dysfunction His SHIM score is 13, which is mild to moderate ED.   His previous SHIM score was 7.  He has been having difficulty with erections for over two years. His major complaint is lack of firmness with erections.  His libido is diminshed.   His risk factors for ED are age, BPH, testosterone deficiency, DM, HTN, sleep apnea and antidepressants.  He denies any painful erections or curvatures with his erections.   He is still having spontaneous erections.  He has tried Viagra in the past and it is not very effective.  His wife is not wanting him to take PDE5i's as she feels he will have a heart attack.   SHIM    Row Name 06/29/19 1529         SHIM: Over the last 6 months:   How do you rate your confidence that you could get and keep an erection?  Low     When you had erections with sexual stimulation, how often were your erections hard enough for penetration (entering your  partner)?  A Few Times (much less than half the time)     During sexual intercourse, how often were you able to maintain your erection after you had penetrated (entered) your partner?  A Few Times (much less than half the time)     During sexual intercourse, how difficult was it to maintain your erection to completion of intercourse?  Difficult     When you attempted sexual intercourse, how often was it satisfactory for you?  Most Times (much more than half the time)       SHIM Total Score   SHIM  13        Score: 1-7 Severe ED 8-11 Moderate ED 12-16 Mild-Moderate ED 17-21 Mild ED 22-25 No ED      PMH: Past Medical History:  Diagnosis Date  . Anxiety   . Arthritis    bil knees  . Bipolar disorder ([place])   . Colon polyp Dec 2015   Dr Allen Norris  . Complication of anesthesia   . Depression    dx 2004  . Diabetes mellitus without complication ([place])    type ll  controlled by weight & diet  . GERD (gastroesophageal reflux disease)   . Gout   . H/O hiatal hernia    had surgery for that 2012   [facility]  . Heartburn   . Hypertension   . Hypothyroidism   . PONV (postoperative nausea and vomiting)   . Sleep apnea    dx 1.5 yr ago...study @ [facility], does not use CPAP    Surgical History: Past Surgical History:  Procedure Laterality Date  . APPENDECTOMY    . COLONOSCOPY  Dec 2015   Dr Allen Norris  . COLONOSCOPY WITH PROPOFOL N/A 11/25/2017   Procedure: COLONOSCOPY WITH PROPOFOL;  Surgeon: Lucilla Lame, MD;  Location: [facility] ENDOSCOPY;  Service: Endoscopy;  Laterality: N/A;  . EXCISIONAL HEMORRHOIDECTOMY  01/02/15  . FOOT SURGERY     right foot 2012  . HERNIA REPAIR     Left inguinal  . HERNIA REPAIR     hiatal hernia at [place]  . INGUINAL HERNIA REPAIR Left 10/31/2017   Procedure: OPEN LEFT INGUINAL RECURRENT HERNIA REPAIR;  Surgeon: Johnathan Hausen, MD;  Location: [facility];  Service: General;  Laterality: Left;  [name] MANDIBLE SURGERY     1983  . REPLACEMENT TOTAL KNEE BILATERAL     patient had knee replacement to his right side in 2015 and right 2014, Revision of R knee 12/04/16  . ROTATOR CUFF REPAIR     x 3  on right shoulder  . SHOULDER ARTHROSCOPY WITH ROTATOR CUFF REPAIR AND SUBACROMIAL DECOMPRESSION Left 03/04/2013   Procedure: LEFT SHOULDER ARTHROSCOPY WITH ROTATOR CUFF REPAIR AND SUBACROMIAL DECOMPRESSION;  Surgeon: Marin Shutter, MD;  Location: [facility];  Service: Orthopedics;  Laterality: Left;  . SHOULDER SURGERY     x 3 on left    Home Medications:  Allergies as of 06/29/2019      Reactions   Tequin [gatifloxacin] Anaphylaxis, Rash   Amoxicillin Hives      Medication List       Accurate as of June 29, 2019  3:49 PM. If you have any questions, ask your nurse or doctor.        allopurinol 100 MG tablet Commonly known as: ZYLOPRIM TAKE 1 TABLET(100 MG) BY MOUTH DAILY   amLODipine 10 MG tablet Commonly known as: NORVASC Take 5 mg by mouth daily.   ARIPiprazole 2 MG tablet  Commonly known as: Abilify Take 1 tablet (2 mg total) by mouth  daily.   buPROPion 100 MG 12 hr tablet Commonly known as: Wellbutrin SR Take 1 tablet (100 mg total) by mouth daily.   cyclobenzaprine 5 MG tablet Commonly known as: FLEXERIL Take 1 tablet (5 mg total) by mouth 3 (three) times daily as needed for muscle spasms.   EpiPen 2-Pak 0.3 mg/0.3 mL Soaj injection Generic drug: EPINEPHrine   fluticasone 50 MCG/ACT nasal spray Commonly known as: FLONASE Place 2 sprays into both nostrils daily.   HYDROcodone-acetaminophen 10-325 MG tablet Commonly known as: NORCO Take 1 tablet by mouth every 4 (four) hours as needed for severe pain.   levothyroxine 25 MCG tablet Commonly known as: SYNTHROID TAKE 1 TABLET BY MOUTH DAILY BEFORE BREAKFAST   lithium carbonate 450 MG CR tablet Commonly known as: ESKALITH Take 1 tablet (450 mg total) by mouth daily.   loratadine 10 MG tablet Commonly known as: CLARITIN Take 10 mg by mouth daily.   meloxicam 7.5 MG tablet Commonly known as: MOBIC Take 1 tablet (7.5 mg total) by mouth daily.   metFORMIN 1000 MG tablet Commonly known as: GLUCOPHAGE Take 1 tablet (1,000 mg total) by mouth 2 (two) times daily with a meal.   montelukast 10 MG tablet Commonly known as: SINGULAIR TK 1 T PO QPM   multivitamin with minerals Tabs tablet Take 1 tablet by mouth daily.   omeprazole 20 MG capsule Commonly known as: PRILOSEC Take 20 mg by mouth daily.   Xyosted 75 MG/0.5ML Soaj Generic drug: Testosterone Enanthate once a week.   Testosterone Enanthate 75 MG/0.5ML Soaj Commonly known as: Xyosted Inject 75 mg into the skin every 7 (seven) days.   zolpidem 10 MG tablet Commonly known as: AMBIEN Take 1 tablet (10 mg total) by mouth at bedtime as needed for sleep.       Allergies:  Allergies  Allergen Reactions  . Tequin [Gatifloxacin] Anaphylaxis and Rash  . Amoxicillin Hives    Family History: Family History  Problem Relation Age of Onset  . CAD Mother   . Arthritis Mother   . Angina Mother    . Diabetes Father   . Heart disease Father   . Colon polyps Father   . Heart attack Father   . Bipolar disorder Sister   . Asthma Sister   . Schizophrenia Sister   . Diabetes Maternal Grandmother   . Cancer Maternal Grandmother   . Heart attack Maternal Grandfather   . Stomach cancer Paternal Grandmother   . Kidney disease Neg Hx   . Prostate cancer Neg Hx   . Other Neg Hx        hypogonadism    Social History:  reports that he has never smoked. He quit smokeless tobacco use about 29 years ago.  His smokeless tobacco use included chew. He reports current alcohol use of about 12.0 standard drinks of alcohol per week. He reports that he does not use drugs.  ROS: UROLOGY Frequent Urination?: No Hard to postpone urination?: No Burning/pain with urination?: No Get up at night to urinate?: No Leakage of urine?: No Urine stream starts and stops?: No Trouble starting stream?: No Do you have to strain to urinate?: No Blood in urine?: No Urinary tract infection?: No Sexually transmitted disease?: No Injury to kidneys or bladder?: No Painful intercourse?: No Weak stream?: No Erection problems?: Yes Penile pain?: No  Gastrointestinal Nausea?: No Vomiting?: No Indigestion/heartburn?: No Diarrhea?: No Constipation?: No  Constitutional  Fever: No Night sweats?: No Weight loss?: No Fatigue?: No  Skin Skin rash/lesions?: No Itching?: No  Eyes Blurred vision?: No Double vision?: No  Ears/Nose/Throat Sore throat?: No Sinus problems?: No  Hematologic/Lymphatic Swollen glands?: No Easy bruising?: No  Cardiovascular Leg swelling?: No Chest pain?: No  Respiratory Cough?: No Shortness of breath?: No  Endocrine Excessive thirst?: No  Musculoskeletal Back pain?: No Joint pain?: No  Neurological Headaches?: No Dizziness?: No  Psychologic Depression?: Yes Anxiety?: No  Physical Exam: BP (!) 142/99 (BP Location: Left Arm, Patient Position: Sitting, Cuff  Size: Normal)   Pulse 71   Ht 5\' 10"  (1.778 m)   Wt 207 lb (93.9 kg)   BMI 29.70 kg/m   Constitutional:  Well nourished. Alert and oriented, No acute distress. HEENT: [place] AT, moist mucus membranes.  Trachea midline, no masses. Cardiovascular: No clubbing, cyanosis, or edema. Respiratory: Normal respiratory effort, no increased work of breathing. GI: Abdomen is soft, non tender, non distended, no abdominal masses. Liver and spleen not palpable.  No hernias appreciated.  Stool sample for occult testing is not indicated.   GU: No CVA tenderness.  No bladder fullness or masses.  Patient with circumcised phallus.  Urethral meatus is patent.  No penile discharge. No penile lesions or rashes. Scrotum without lesions, cysts, rashes and/or edema.  Testicles are located scrotally bilaterally. They are atrophic.  No masses are appreciated in the testicles. Left and right epididymis are normal. Rectal: Patient with  normal sphincter tone. Anus and perineum without scarring or rashes. No rectal masses are appreciated. Prostate is approximately 50 grams, no nodules are appreciated. Seminal vesicles could not be palpated.   Skin: No rashes, bruises or suspicious lesions. Lymph: No inguinal adenopathy. Neurologic: Grossly intact, no focal deficits, moving all 4 extremities. Psychiatric: Normal mood and affect.  Laboratory Data:  I have reviewed labs. See epic.   Assessment & Plan:    1. Testosterone deficiency  Most recent testosterone level is 508 ng/dL on 06/22/2019 (goal 450-600 ng/dL)  2. Erythrocytosis  Patient's HCT is 54% or greater.  We will need to discontinue the testosterone therapy for 2 months and recheck the HBG/HCT and testosterone levels.  If the HBG/HCT normalize, we will need to restart the testosterone therapy at a reduced dose.  If the HBG/HCT does not normalize or continues to increase even at a reduced dose, patient we need to evaluated for sleep apnea and/or hypoxia and testosterone  therapy discontinued.    3. BPH with LUTS IPSS score is 11/2, it is improving Continue conservative management, avoiding bladder irritants and timed voiding's RTC in 6 months for IPSS, PSA and exam, as testosterone therapy can cause prostate enlargement and worsen LUTS  4. Erectile dysfunction:    SHIM score is 80 Wife is not interested in PDE5i's due to fear of heart attack He is not interested in [employer] RTC in 6 months for SHIM score and exam, as testosterone therapy can affect erections   Return in about 2 months (around 08/29/2019) for HCT, hemoglobin and testosterone only .  These notes generated with voice recognition software. I apologize for typographical errors.  Laneta Simmers  [facility] [address]  [place], [place] [address] [phone]

## 2019-06-29 ENCOUNTER — Ambulatory Visit (INDEPENDENT_AMBULATORY_CARE_PROVIDER_SITE_OTHER): Payer: 59 | Admitting: Urology

## 2019-06-29 ENCOUNTER — Encounter: Payer: Self-pay | Admitting: Urology

## 2019-06-29 ENCOUNTER — Other Ambulatory Visit: Payer: Self-pay

## 2019-06-29 VITALS — BP 142/99 | HR 71 | Ht 70.0 in | Wt 207.0 lb

## 2019-06-29 DIAGNOSIS — E349 Endocrine disorder, unspecified: Secondary | ICD-10-CM | POA: Diagnosis not present

## 2019-06-29 DIAGNOSIS — D751 Secondary polycythemia: Secondary | ICD-10-CM

## 2019-06-29 DIAGNOSIS — N529 Male erectile dysfunction, unspecified: Secondary | ICD-10-CM

## 2019-06-29 DIAGNOSIS — N138 Other obstructive and reflux uropathy: Secondary | ICD-10-CM

## 2019-06-29 DIAGNOSIS — N401 Enlarged prostate with lower urinary tract symptoms: Secondary | ICD-10-CM

## 2019-07-06 ENCOUNTER — Other Ambulatory Visit: Payer: Self-pay | Admitting: Family Medicine

## 2019-07-06 DIAGNOSIS — S39012A Strain of muscle, fascia and tendon of lower back, initial encounter: Secondary | ICD-10-CM

## 2019-08-09 ENCOUNTER — Other Ambulatory Visit: Payer: Self-pay

## 2019-08-09 DIAGNOSIS — Z20822 Contact with and (suspected) exposure to covid-19: Secondary | ICD-10-CM

## 2019-08-11 LAB — NOVEL CORONAVIRUS, NAA: SARS-CoV-2, NAA: NOT DETECTED

## 2019-08-27 ENCOUNTER — Other Ambulatory Visit: Payer: Self-pay | Admitting: Family Medicine

## 2019-08-27 DIAGNOSIS — E349 Endocrine disorder, unspecified: Secondary | ICD-10-CM

## 2019-08-30 ENCOUNTER — Other Ambulatory Visit: Payer: 59

## 2019-08-30 ENCOUNTER — Other Ambulatory Visit: Payer: Self-pay

## 2019-08-30 DIAGNOSIS — E349 Endocrine disorder, unspecified: Secondary | ICD-10-CM

## 2019-08-31 ENCOUNTER — Other Ambulatory Visit: Payer: Self-pay | Admitting: Urology

## 2019-08-31 ENCOUNTER — Other Ambulatory Visit: Payer: Self-pay | Admitting: Family Medicine

## 2019-08-31 ENCOUNTER — Telehealth: Payer: Self-pay | Admitting: Family Medicine

## 2019-08-31 DIAGNOSIS — E349 Endocrine disorder, unspecified: Secondary | ICD-10-CM

## 2019-08-31 LAB — TESTOSTERONE: Testosterone: 192 ng/dL — ABNORMAL LOW (ref 264–916)

## 2019-08-31 LAB — HEMATOCRIT: Hematocrit: 46.9 % (ref 37.5–51.0)

## 2019-08-31 LAB — HEMOGLOBIN: Hemoglobin: 16.1 g/dL (ref 13.0–17.7)

## 2019-08-31 MED ORDER — XYOSTED 50 MG/0.5ML ~~LOC~~ SOAJ: 50.0000 mg | SUBCUTANEOUS | 1 refills | Status: DC

## 2019-08-31 NOTE — Telephone Encounter (Signed)
Patient notified and Larene Beach will send in the new RX for [facility] with the adjusted dose.

## 2019-08-31 NOTE — Telephone Encounter (Signed)
LMOM for patient to return call.

## 2019-08-31 NOTE — Telephone Encounter (Signed)
-----   Message from Nori Riis, PA-C sent at 08/31/2019  8:33 AM EST ----- Please let Jeremiah [name] know that his HCT and hemoglobin have returned to normal levels.  He can restart his testosterone injections, but he need to inject 0.5cc every week.  We then need to check his testosterone level, HCT and HBG one week after his fourth injection.

## 2019-08-31 NOTE — Telephone Encounter (Signed)
Pt returned your call.  

## 2019-09-10 NOTE — Progress Notes (Signed)
Patient: Jeremiah [name] Male    DOB: 08/21/1961   58 y.o.   MRN: [id number] Visit Date: 09/16/2019  Today's Provider: [name], MD   Chief Complaint  Patient presents with  . Follow-up  . Diabetes  . Hypothyroidism   Subjective:     HPI   Diabetes mellitus without complication ([place]) From 123456 control==pt has not been checking BS at home--check FBS and work on lifestyle--RTC 2-26months. Hb A1C 11.9.  Home blood sugar readings 120-256.  Adult hypothyroidism From 12/15/2018-labs stable.  Bipolar depression ([place])  From 06/17/2019-Controlled. Stable.   Allergies  Allergen Reactions  . Tequin [Gatifloxacin] Anaphylaxis and Rash  . Amoxicillin Hives     Current Outpatient Medications:  .  allopurinol (ZYLOPRIM) 100 MG tablet, TAKE 1 TABLET(100 MG) BY MOUTH DAILY, Disp: 30 tablet, Rfl: 11 .  amLODipine (NORVASC) 10 MG tablet, Take 5 mg by mouth daily., Disp: , Rfl:  .  ARIPiprazole (ABILIFY) 2 MG tablet, Take 1 tablet (2 mg total) by mouth daily., Disp: 90 tablet, Rfl: 1 .  aspirin EC 81 MG tablet, Take 81 mg by mouth daily., Disp: , Rfl:  .  buPROPion (WELLBUTRIN SR) 100 MG 12 hr tablet, Take 1 tablet (100 mg total) by mouth daily., Disp: 30 tablet, Rfl: 1 .  cyclobenzaprine (FLEXERIL) 5 MG tablet, Take 1 tablet (5 mg total) by mouth 3 (three) times daily as needed for muscle spasms., Disp: 21 tablet, Rfl: 0 .  EPIPEN 2-PAK 0.3 MG/0.3ML SOAJ injection, , Disp: , Rfl:  .  fluticasone (FLONASE) 50 MCG/ACT nasal spray, Place 2 sprays into both nostrils daily., Disp: 16 g, Rfl: 1 .  HYDROcodone-acetaminophen (NORCO) 10-325 MG tablet, Take 1 tablet by mouth every 4 (four) hours as needed for severe pain., Disp: 20 tablet, Rfl: 0 .  levothyroxine (SYNTHROID, LEVOTHROID) 25 MCG tablet, TAKE 1 TABLET BY MOUTH DAILY BEFORE BREAKFAST, Disp: 90 tablet, Rfl: 3 .  lithium carbonate (ESKALITH) 450 MG CR tablet, Take 1 tablet (450 mg total) by mouth daily., Disp:  90 tablet, Rfl: 1 .  loratadine (CLARITIN) 10 MG tablet, Take 10 mg by mouth daily., Disp: , Rfl:  .  meloxicam (MOBIC) 7.5 MG tablet, TAKE 1 TABLET(7.5 MG) BY MOUTH DAILY, Disp: 30 tablet, Rfl: 4 .  metFORMIN (GLUCOPHAGE) 1000 MG tablet, Take 1 tablet (1,000 mg total) by mouth 2 (two) times daily with a meal., Disp: 60 tablet, Rfl: 12 .  montelukast (SINGULAIR) 10 MG tablet, TK 1 T PO QPM, Disp: , Rfl: 2 .  Multiple Vitamin (MULTIVITAMIN WITH MINERALS) TABS, Take 1 tablet by mouth daily., Disp: , Rfl:  .  omeprazole (PRILOSEC) 20 MG capsule, Take 20 mg by mouth daily., Disp: , Rfl:  .  Testosterone Enanthate (XYOSTED) 50 MG/0.5ML SOAJ, Inject 50 mg into the skin every 7 (seven) days., Disp: 4 pen, Rfl: 1 .  zolpidem (AMBIEN) 10 MG tablet, Take 1 tablet (10 mg total) by mouth at bedtime as needed for sleep., Disp: 30 tablet, Rfl: 1  Review of Systems  Constitutional: Negative for appetite change, chills and fever.  HENT: Negative.   Eyes: Negative.   Respiratory: Negative for chest tightness, shortness of breath and wheezing.   Cardiovascular: Negative for chest pain and palpitations.  Gastrointestinal: Negative for abdominal pain, nausea and vomiting.  Endocrine: Negative.   Allergic/Immunologic: Negative.   Hematological: Negative.   Psychiatric/Behavioral: Negative.     Social History   Tobacco Use  .  Smoking status: Never Smoker  . Smokeless tobacco: Former Systems developer    Types: Chew  Substance Use Topics  . Alcohol use: Yes    Alcohol/week: 12.0 standard drinks    Types: 12 Cans of beer per week      Objective:   BP 136/89 (BP Location: Right Arm, Patient Position: Sitting, Cuff Size: Large)   Pulse 74   Temp (!) 97.3 F (36.3 C) (Other (Comment))   Resp 16   Ht 5\' 10"  (1.778 m)   Wt 208 lb (94.3 kg)   SpO2 94%   BMI 29.84 kg/m  Vitals:   09/16/19 1513  BP: 136/89  Pulse: 74  Resp: 16  Temp: (!) 97.3 F (36.3 C)  TempSrc: Other (Comment)  SpO2: 94%  Weight: 208  lb (94.3 kg)  Height: 5\' 10"  (1.778 m)  Body mass index is 29.84 kg/m.   Physical Exam Vitals reviewed.  Constitutional:      Appearance: He is well-developed.  HENT:     Head: Normocephalic and atraumatic.     Right Ear: External ear normal.     Left Ear: External ear normal.     Nose: Nose normal.  Eyes:     General: No scleral icterus.    Conjunctiva/sclera: Conjunctivae normal.     Pupils: Pupils are equal, round, and reactive to light.  Neck:     Thyroid: No thyromegaly.  Cardiovascular:     Rate and Rhythm: Normal rate and regular rhythm.     Heart sounds: Normal heart sounds.  Pulmonary:     Effort: Pulmonary effort is normal.     Breath sounds: Normal breath sounds.  Abdominal:     Palpations: Abdomen is soft.  Skin:    General: Skin is warm and dry.  Neurological:     General: No focal deficit present.     Mental Status: He is alert and oriented to person, place, and time.     Comments: Diabetic foot exam normal.  Psychiatric:        Mood and Affect: Mood normal.        Behavior: Behavior normal.        Thought Content: Thought content normal.        Judgment: Judgment normal.      Results for orders placed or performed in visit on 09/16/19  POCT HgB A1C  Result Value Ref Range   Hemoglobin A1C 7.5 (A) 4.0 - 5.6 %   Est. average glucose Bld gHb Est-mCnc 169        Assessment & Plan    1. Diabetes mellitus without complication ([place]) Much improved from 11.9. no changes today. RTC 4 months. - POCT HgB A1C 7.5.   I,Lee-Ann Gal,acting as a scribe for [name], MD.,have documented all relevant documentation on the behalf of [name], MD,as directed by  [name], MD while in the presence of [name], MD.     [name], MD  [facility]

## 2019-09-16 ENCOUNTER — Encounter: Payer: Self-pay | Admitting: Family Medicine

## 2019-09-16 ENCOUNTER — Ambulatory Visit: Payer: 59 | Admitting: Family Medicine

## 2019-09-16 ENCOUNTER — Other Ambulatory Visit: Payer: Self-pay

## 2019-09-16 VITALS — BP 136/89 | HR 74 | Temp 97.3°F | Resp 16 | Ht 70.0 in | Wt 208.0 lb

## 2019-09-16 DIAGNOSIS — E119 Type 2 diabetes mellitus without complications: Secondary | ICD-10-CM

## 2019-09-16 LAB — POCT GLYCOSYLATED HEMOGLOBIN (HGB A1C)
Est. average glucose Bld gHb Est-mCnc: 169
Hemoglobin A1C: 7.5 % — AB (ref 4.0–5.6)

## 2019-10-04 ENCOUNTER — Other Ambulatory Visit: Payer: Self-pay | Admitting: Family Medicine

## 2019-10-04 MED ORDER — LEVOTHYROXINE SODIUM 25 MCG PO TABS: ORAL_TABLET | ORAL | 3 refills | Status: DC

## 2019-10-04 NOTE — Telephone Encounter (Addendum)
[facility] faxed refill request for the following medications:  levothyroxine (SYNTHROID, LEVOTHROID) 25 MCG tablet  allopurinol (ZYLOPRIM) 100 MG tablet    Please advise.

## 2019-10-05 ENCOUNTER — Ambulatory Visit: Payer: 59 | Attending: Internal Medicine

## 2019-10-05 DIAGNOSIS — Z20822 Contact with and (suspected) exposure to covid-19: Secondary | ICD-10-CM

## 2019-10-06 LAB — NOVEL CORONAVIRUS, NAA: SARS-CoV-2, NAA: NOT DETECTED

## 2019-10-11 ENCOUNTER — Other Ambulatory Visit: Payer: Self-pay | Admitting: Family Medicine

## 2019-10-11 NOTE — Telephone Encounter (Signed)
[facility] faxed refill request for the following medications:  allopurinol (ZYLOPRIM) 100 MG tablet  Last Rx: 08/24/2018 LOV: 09/16/2019 Please advise. Thanks TNP

## 2019-10-12 MED ORDER — ALLOPURINOL 100 MG PO TABS: ORAL_TABLET | ORAL | 11 refills | Status: AC

## 2019-10-14 ENCOUNTER — Other Ambulatory Visit: Payer: Self-pay | Admitting: Family Medicine

## 2019-10-15 ENCOUNTER — Encounter: Payer: Self-pay | Admitting: Family Medicine

## 2019-10-15 ENCOUNTER — Other Ambulatory Visit: Payer: Self-pay | Admitting: Family Medicine

## 2019-10-15 DIAGNOSIS — E349 Endocrine disorder, unspecified: Secondary | ICD-10-CM

## 2019-10-15 NOTE — Telephone Encounter (Signed)
Mr. Hovorka needs his HCT, Hgb and testosterone level checked prior to refilling his Xyosted.

## 2019-10-26 ENCOUNTER — Other Ambulatory Visit: Payer: 59

## 2019-10-27 ENCOUNTER — Other Ambulatory Visit: Payer: 59

## 2019-10-28 ENCOUNTER — Other Ambulatory Visit: Payer: Self-pay

## 2019-10-28 ENCOUNTER — Other Ambulatory Visit: Payer: 59

## 2019-10-28 DIAGNOSIS — E349 Endocrine disorder, unspecified: Secondary | ICD-10-CM

## 2019-10-29 LAB — HEMATOCRIT: Hematocrit: 48.8 % (ref 37.5–51.0)

## 2019-10-29 LAB — HEMOGLOBIN: Hemoglobin: 16 g/dL (ref 13.0–17.7)

## 2019-10-29 LAB — TESTOSTERONE: Testosterone: 125 ng/dL — ABNORMAL LOW (ref 264–916)

## 2019-11-02 ENCOUNTER — Telehealth: Payer: Self-pay | Admitting: Urology

## 2019-11-02 ENCOUNTER — Telehealth: Payer: Self-pay | Admitting: Family Medicine

## 2019-11-02 NOTE — Telephone Encounter (Signed)
Unable to leave message, mailbox is full.

## 2019-11-02 NOTE — Telephone Encounter (Signed)
LMOM for patient to return call.

## 2019-11-02 NOTE — Telephone Encounter (Signed)
-----   Message from Nori Riis, PA-C sent at 11/02/2019  7:42 AM EST ----- Mr. Kolton needs to have his testosterone, HCT and hemoglobin checked this week.

## 2019-11-02 NOTE — Telephone Encounter (Signed)
The labs that resulted on 10/28/2019 are within the normal range, for the exception of the testosterone.   If he is injecting 50 mg of Xyosted weekly, it is not getting his levels to the therapeutic range.  We can try to increase the Xyosted up to 75 mg weekly and see what happens to his  HCT and hemoglobin.  I would make sure he is sleeping with the CPAP machine nightly.

## 2019-11-03 ENCOUNTER — Other Ambulatory Visit: Payer: Self-pay | Admitting: Urology

## 2019-11-03 MED ORDER — XYOSTED 75 MG/0.5ML ~~LOC~~ SOAJ: 75.0000 mg | SUBCUTANEOUS | 1 refills | Status: AC

## 2019-11-03 NOTE — Telephone Encounter (Signed)
I have sent the prescription in for the 75 mg pen.

## 2019-11-03 NOTE — Telephone Encounter (Signed)
Patient states he will increase Xyosted to 75mg  and he is using his CPAP machine nightly. He states he will need a new RX.

## 2019-11-08 ENCOUNTER — Other Ambulatory Visit: Payer: Self-pay | Admitting: Family Medicine

## 2019-11-08 DIAGNOSIS — E349 Endocrine disorder, unspecified: Secondary | ICD-10-CM

## 2019-11-08 NOTE — Telephone Encounter (Signed)
Patient notified and appointment for labs has been scheduled. Orders are in.

## 2019-11-08 NOTE — Telephone Encounter (Signed)
-----   Message from Nori Riis, PA-C sent at 11/04/2019  8:06 AM EST ----- We need to have Mr. Distler's testosterone, HCT and hemoglobin checked in three months.

## 2019-11-19 ENCOUNTER — Telehealth: Payer: Self-pay

## 2019-11-19 NOTE — Telephone Encounter (Signed)
Left detailed message on pt's vm advising there are no appointment available sooner than 11/24/2019.

## 2019-11-19 NOTE — Telephone Encounter (Signed)
Copied from [place] [id number]. Topic: Appointment Scheduling - Scheduling Inquiry for Clinic >> Nov 19, 2019 10:10 AM Jeremiah [name] D wrote: Reason for CRM: pt scheduled an appt for the 17th , next Wednesday for stress.  That was the first appt that came up.  He would like to come in sooner to see [name] if there is any way he can be worked in. CB#  [phone]

## 2019-11-22 NOTE — Telephone Encounter (Signed)
Patient notified and lab is scheduled

## 2019-11-24 ENCOUNTER — Encounter: Payer: Self-pay | Admitting: Family Medicine

## 2019-11-24 ENCOUNTER — Ambulatory Visit: Payer: 59 | Admitting: Family Medicine

## 2019-11-24 ENCOUNTER — Encounter: Payer: Self-pay | Admitting: *Deleted

## 2019-11-24 ENCOUNTER — Other Ambulatory Visit: Payer: Self-pay

## 2019-11-24 VITALS — BP 145/100 | HR 84 | Temp 96.9°F | Resp 16 | Ht 70.0 in | Wt 211.0 lb

## 2019-11-24 DIAGNOSIS — F3113 Bipolar disorder, current episode manic without psychotic features, severe: Secondary | ICD-10-CM

## 2019-11-24 MED ORDER — ARIPIPRAZOLE 2 MG PO TABS: ORAL_TABLET | ORAL | 0 refills | Status: AC

## 2019-11-24 NOTE — Progress Notes (Signed)
Patient: Jeremiah [name] Male    DOB: 1961/01/20   59 y.o.   MRN: [id number] Visit Date: 11/24/2019  Today's Provider: [name], MD   Chief Complaint  Patient presents with  . Anxiety  . Stress   Subjective:     HPI   Patient states he has been having stress and anxiety lately. Patient states he had Anxiety years ago but referred to psychiatry. He has a history of bipolar depression and he has had manic episodes before and he is in the middle of 1 now.  He feels as though is going to jump out of his skin.  Is not suicidal or homicidal.  An increased job stress and home issues that revolve around home repairs.  He and his wife are doing well.  He does have an appointment in [place] to see Dr. Toy Care  in [place] from psychiatry  Bipolar depression [facility]) From 06/17/2019-Controlled.   Allergies  Allergen Reactions  . Tequin [Gatifloxacin] Anaphylaxis and Rash  . Amoxicillin Hives     Current Outpatient Medications:  .  allopurinol (ZYLOPRIM) 100 MG tablet, TAKE 1 TABLET(100 MG) BY MOUTH DAILY, Disp: 30 tablet, Rfl: 11 .  amLODipine (NORVASC) 10 MG tablet, Take 5 mg by mouth daily., Disp: , Rfl:  .  ARIPiprazole (ABILIFY) 2 MG tablet, Take 1 tablet (2 mg total) by mouth daily., Disp: 90 tablet, Rfl: 1 .  aspirin EC 81 MG tablet, Take 81 mg by mouth daily., Disp: , Rfl:  .  buPROPion (WELLBUTRIN SR) 100 MG 12 hr tablet, Take 1 tablet (100 mg total) by mouth daily., Disp: 30 tablet, Rfl: 1 .  cyclobenzaprine (FLEXERIL) 5 MG tablet, Take 1 tablet (5 mg total) by mouth 3 (three) times daily as needed for muscle spasms., Disp: 21 tablet, Rfl: 0 .  EPIPEN 2-PAK 0.3 MG/0.3ML SOAJ injection, , Disp: , Rfl:  .  fluticasone (FLONASE) 50 MCG/ACT nasal spray, Place 2 sprays into both nostrils daily., Disp: 16 g, Rfl: 1 .  HYDROcodone-acetaminophen (NORCO) 10-325 MG tablet, Take 1 tablet by mouth every 4 (four) hours as needed for severe pain., Disp: 20 tablet, Rfl: 0 .   levothyroxine (SYNTHROID) 25 MCG tablet, TAKE 1 TABLET BY MOUTH DAILY BEFORE BREAKFAST, Disp: 90 tablet, Rfl: 3 .  lithium carbonate (ESKALITH) 450 MG CR tablet, Take 1 tablet (450 mg total) by mouth daily., Disp: 90 tablet, Rfl: 1 .  loratadine (CLARITIN) 10 MG tablet, Take 10 mg by mouth daily., Disp: , Rfl:  .  meloxicam (MOBIC) 7.5 MG tablet, TAKE 1 TABLET(7.5 MG) BY MOUTH DAILY, Disp: 30 tablet, Rfl: 4 .  metFORMIN (GLUCOPHAGE) 1000 MG tablet, Take 1 tablet (1,000 mg total) by mouth 2 (two) times daily with a meal., Disp: 60 tablet, Rfl: 12 .  montelukast (SINGULAIR) 10 MG tablet, TK 1 T PO QPM, Disp: , Rfl: 2 .  Multiple Vitamin (MULTIVITAMIN WITH MINERALS) TABS, Take 1 tablet by mouth daily., Disp: , Rfl:  .  omeprazole (PRILOSEC) 20 MG capsule, Take 20 mg by mouth daily., Disp: , Rfl:  .  Testosterone Enanthate (XYOSTED) 75 MG/0.5ML SOAJ, Inject 75 mg into the skin every 7 (seven) days., Disp: 4 pen, Rfl: 1 .  zolpidem (AMBIEN) 10 MG tablet, Take 1 tablet (10 mg total) by mouth at bedtime as needed for sleep., Disp: 30 tablet, Rfl: 1  Review of Systems  Constitutional: Negative for appetite change, chills and fever.  Eyes: Negative.  Respiratory: Negative for chest tightness, shortness of breath and wheezing.   Cardiovascular: Negative for chest pain and palpitations.  Gastrointestinal: Negative for abdominal pain, nausea and vomiting.  Endocrine: Negative.   Allergic/Immunologic: Negative.   Neurological: Negative.   Psychiatric/Behavioral: Positive for agitation.    Social History   Tobacco Use  . Smoking status: Never Smoker  . Smokeless tobacco: Former Systems developer    Types: Chew  Substance Use Topics  . Alcohol use: Yes    Alcohol/week: 12.0 standard drinks    Types: 12 Cans of beer per week      Objective:   BP (!) 146/103 (BP Location: Right Arm, Patient Position: Sitting, Cuff Size: Large)   Pulse 86   Temp (!) 96.9 F (36.1 C) (Oral)   Resp 16   Ht 5\' 10"  (1.778 m)    Wt 211 lb (95.7 kg)   SpO2 97%   BMI 30.28 kg/m  Vitals:   11/24/19 1519  BP: (!) 146/103  Pulse: 86  Resp: 16  Temp: (!) 96.9 F (36.1 C)  TempSrc: Oral  SpO2: 97%  Weight: 211 lb (95.7 kg)  Height: 5\' 10"  (1.778 m)  Body mass index is 30.28 kg/m.   Physical Exam Vitals reviewed.  Constitutional:      Appearance: Normal appearance.  Neurological:     Mental Status: He is alert.  Psychiatric:        Mood and Affect: Mood normal.        Thought Content: Thought content normal.        Judgment: Judgment normal.     Comments: Patient is obviously very agitated today.  Thought processes are normal and he is in no way inappropriate.      No results found for any visits on 11/24/19.     Assessment & Plan     1. Bipolar disorder, current episode manic without psychotic features, severe (HCC) Increase Abilify 2 mg to 5 tablets 2 times daily if the second dose as needed.. Stay out of work until 12/02/2019. Follow up appointment scheduled for 11/30/2019 at 3:40 pm.  May need to get him set to psychiatry urgently if not improving.  - ARIPiprazole (ABILIFY) 2 MG tablet; Take 5 tablets by mouth 2 times daily  Dispense: 60 tablet; Refill: 0      I,Aldea Avis,acting as a scribe for [name], MD.,have documented all relevant documentation on the behalf of [name], MD,as directed by  [name], MD while in the presence of [name], MD.     [name], MD  [facility]

## 2019-11-24 NOTE — Patient Instructions (Addendum)
Increase Abilify 2 mg to 5 tablets 2 times daily. Stay out of work until 12/02/2019. Follow up appointment scheduled for 11/30/2019 at 3:40 pm.

## 2019-11-30 ENCOUNTER — Encounter: Payer: Self-pay | Admitting: Family Medicine

## 2019-11-30 ENCOUNTER — Ambulatory Visit: Payer: 59 | Admitting: Family Medicine

## 2019-11-30 ENCOUNTER — Other Ambulatory Visit: Payer: Self-pay

## 2019-11-30 VITALS — BP 131/96 | HR 97 | Temp 96.9°F | Wt 210.0 lb

## 2019-11-30 DIAGNOSIS — F3113 Bipolar disorder, current episode manic without psychotic features, severe: Secondary | ICD-10-CM

## 2019-11-30 DIAGNOSIS — I10 Essential (primary) hypertension: Secondary | ICD-10-CM | POA: Diagnosis not present

## 2019-11-30 DIAGNOSIS — E119 Type 2 diabetes mellitus without complications: Secondary | ICD-10-CM

## 2019-11-30 DIAGNOSIS — R7989 Other specified abnormal findings of blood chemistry: Secondary | ICD-10-CM

## 2019-11-30 DIAGNOSIS — E039 Hypothyroidism, unspecified: Secondary | ICD-10-CM | POA: Diagnosis not present

## 2019-11-30 DIAGNOSIS — Z130 Encounter for screening for diseases of the blood and blood-forming organs and certain disorders involving the immune mechanism: Secondary | ICD-10-CM

## 2019-11-30 DIAGNOSIS — J301 Allergic rhinitis due to pollen: Secondary | ICD-10-CM

## 2019-11-30 DIAGNOSIS — M109 Gout, unspecified: Secondary | ICD-10-CM

## 2019-11-30 NOTE — Progress Notes (Signed)
Patient: Jeremiah [name] Male    DOB: July 30, 1961   59 y.o.   MRN: [id number] Visit Date: 11/30/2019  Today's Provider: [name], MD   Chief Complaint  Patient presents with  . Manic Behavior   Subjective:     HPI   Patient says he feels the same but saw psychiatrist yesterday and she upped his Lithium to 900MG  and he is out of work until March 1st.  He states he is 30 to 40% better since his last visit on the higher dose of Abilify. He had a telemetry visit yesterday with Dr. Toy Care who increased his lithium.  He has follow-up with her in a few weeks. Bipolar disorder, current episode manic without psychotic features, severe ([place]) From 11/24/2019-Increased Abilify from 2 mg to 5 mg tablets two times daily with the second dose as needed.. Stay out of work until 12/02/2019. Follow up appointment scheduled for 11/30/2019 at 3:40 pm.  May need to get him set to psychiatry urgently if not improving.   Allergies  Allergen Reactions  . Tequin [Gatifloxacin] Anaphylaxis and Rash  . Amoxicillin Hives     Current Outpatient Medications:  .  allopurinol (ZYLOPRIM) 100 MG tablet, TAKE 1 TABLET(100 MG) BY MOUTH DAILY, Disp: 30 tablet, Rfl: 11 .  amLODipine (NORVASC) 10 MG tablet, Take 5 mg by mouth daily., Disp: , Rfl:  .  ARIPiprazole (ABILIFY) 2 MG tablet, Take 5 tablets by mouth 2 times daily, Disp: 60 tablet, Rfl: 0 .  aspirin EC 81 MG tablet, Take 81 mg by mouth daily., Disp: , Rfl:  .  buPROPion (WELLBUTRIN SR) 100 MG 12 hr tablet, Take 1 tablet (100 mg total) by mouth daily., Disp: 30 tablet, Rfl: 1 .  cyclobenzaprine (FLEXERIL) 5 MG tablet, Take 1 tablet (5 mg total) by mouth 3 (three) times daily as needed for muscle spasms., Disp: 21 tablet, Rfl: 0 .  EPIPEN 2-PAK 0.3 MG/0.3ML SOAJ injection, , Disp: , Rfl:  .  fluticasone (FLONASE) 50 MCG/ACT nasal spray, Place 2 sprays into both nostrils daily., Disp: 16 g, Rfl: 1 .  HYDROcodone-acetaminophen (NORCO) 10-325 MG tablet,  Take 1 tablet by mouth every 4 (four) hours as needed for severe pain., Disp: 20 tablet, Rfl: 0 .  levothyroxine (SYNTHROID) 25 MCG tablet, TAKE 1 TABLET BY MOUTH DAILY BEFORE BREAKFAST, Disp: 90 tablet, Rfl: 3 .  lithium carbonate (ESKALITH) 450 MG CR tablet, Take 1 tablet (450 mg total) by mouth daily., Disp: 90 tablet, Rfl: 1 .  loratadine (CLARITIN) 10 MG tablet, Take 10 mg by mouth daily., Disp: , Rfl:  .  meloxicam (MOBIC) 7.5 MG tablet, TAKE 1 TABLET(7.5 MG) BY MOUTH DAILY, Disp: 30 tablet, Rfl: 4 .  metFORMIN (GLUCOPHAGE) 1000 MG tablet, Take 1 tablet (1,000 mg total) by mouth 2 (two) times daily with a meal., Disp: 60 tablet, Rfl: 12 .  montelukast (SINGULAIR) 10 MG tablet, TK 1 T PO QPM, Disp: , Rfl: 2 .  Multiple Vitamin (MULTIVITAMIN WITH MINERALS) TABS, Take 1 tablet by mouth daily., Disp: , Rfl:  .  omeprazole (PRILOSEC) 20 MG capsule, Take 20 mg by mouth daily., Disp: , Rfl:  .  Testosterone Enanthate (XYOSTED) 75 MG/0.5ML SOAJ, Inject 75 mg into the skin every 7 (seven) days., Disp: 4 pen, Rfl: 1 .  zolpidem (AMBIEN) 10 MG tablet, Take 1 tablet (10 mg total) by mouth at bedtime as needed for sleep., Disp: 30 tablet, Rfl: 1  Review of  Systems  Constitutional: Negative for appetite change, chills and fever.  Respiratory: Negative for chest tightness, shortness of breath and wheezing.   Cardiovascular: Negative for chest pain and palpitations.  Gastrointestinal: Negative for abdominal pain, nausea and vomiting.  Allergic/Immunologic: Negative.   Neurological: Negative.   Psychiatric/Behavioral: Positive for agitation.    Social History   Tobacco Use  . Smoking status: Never Smoker  . Smokeless tobacco: Former Systems developer    Types: Chew  Substance Use Topics  . Alcohol use: Yes    Alcohol/week: 12.0 standard drinks    Types: 12 Cans of beer per week      Objective:   There were no vitals taken for this visit. There were no vitals filed for this visit.There is no height or  weight on file to calculate BMI.   Physical Exam Vitals reviewed.  Constitutional:      Appearance: Normal appearance.  Neurological:     Mental Status: He is alert.  Psychiatric:        Mood and Affect: Mood normal.        Thought Content: Thought content normal.        Judgment: Judgment normal.     Comments: Patient is much less agitated today.  Thought processes are normal and he is in no way inappropriate.      No results found for any visits on 11/30/19.     Assessment & Plan    1. Bipolar disorder, current episode manic without psychotic features, severe ([place]) Has follow-up with psychiatry, leave him on the Abilify at present dose. - Lithium level - TSH - T4, free - Renal Function Panel - Lipid panel - CBC w/Diff/Platelet - Comprehensive Metabolic Panel (CMET) - Hemoglobin A1C  2. Hypertension, unspecified type  - Lithium level - TSH - T4, free - Renal Function Panel - Lipid panel - CBC w/Diff/Platelet - Comprehensive Metabolic Panel (CMET) - Hemoglobin A1C  3. Type 2 diabetes mellitus without complication, unspecified whether long term insulin use (HCC) A1c this spring. - Lithium level - TSH - T4, free - Renal Function Panel - Lipid panel - CBC w/Diff/Platelet - Comprehensive Metabolic Panel (CMET) - Hemoglobin A1C  4. Adult hypothyroidism  - Lithium level - TSH - T4, free - Renal Function Panel - Lipid panel - CBC w/Diff/Platelet - Comprehensive Metabolic Panel (CMET) - Hemoglobin A1C  5. Seasonal allergic rhinitis due to pollen  - Lithium level - TSH - T4, free - Renal Function Panel - Lipid panel - CBC w/Diff/Platelet - Comprehensive Metabolic Panel (CMET) - Hemoglobin A1C  6. Gout involving toe, unspecified cause, unspecified chronicity, unspecified laterality  - Lithium level - TSH - T4, free - Renal Function Panel - Lipid panel - CBC w/Diff/Platelet - Comprehensive Metabolic Panel (CMET) - Hemoglobin A1C  7. Low  testosterone  - Lithium level - TSH - T4, free - Renal Function Panel - Lipid panel - CBC w/Diff/Platelet - Comprehensive Metabolic Panel (CMET) - Hemoglobin A1C  8. Screening for iron deficiency anemia - Lipid panel - CBC w/Diff/Platelet - Comprehensive Metabolic Panel (CMET) - Hemoglobin A1C     [name], MD  [facility]

## 2019-12-01 LAB — RENAL FUNCTION PANEL
Albumin: 4.6 g/dL (ref 3.8–4.8)
BUN/Creatinine Ratio: 15 (ref 10–24)
BUN: 16 mg/dL (ref 8–27)
CO2: 20 mmol/L (ref 20–29)
Calcium: 9.7 mg/dL (ref 8.6–10.2)
Chloride: 96 mmol/L (ref 96–106)
Creatinine, Ser: 1.04 mg/dL (ref 0.76–1.27)
GFR calc Af Amer: 89 mL/min/{1.73_m2} (ref >59)
GFR calc non Af Amer: 77 mL/min/{1.73_m2} (ref >59)
Glucose: 463 mg/dL — ABNORMAL HIGH (ref 65–99)
Phosphorus: 2.9 mg/dL (ref 2.8–4.1)
Potassium: 4.9 mmol/L (ref 3.5–5.2)
Sodium: 133 mmol/L — ABNORMAL LOW (ref 134–144)

## 2019-12-01 LAB — TSH: TSH: 1.33 u[IU]/mL (ref 0.450–4.500)

## 2019-12-01 LAB — T4, FREE: Free T4: 1.47 ng/dL (ref 0.82–1.77)

## 2019-12-01 LAB — LITHIUM LEVEL: Lithium Lvl: 0.2 mmol/L — ABNORMAL LOW (ref 0.6–1.2)

## 2019-12-03 ENCOUNTER — Other Ambulatory Visit: Payer: Self-pay | Admitting: Family Medicine

## 2019-12-03 DIAGNOSIS — S39012A Strain of muscle, fascia and tendon of lower back, initial encounter: Secondary | ICD-10-CM

## 2019-12-03 NOTE — Telephone Encounter (Signed)
Requested Prescriptions  Pending Prescriptions Disp Refills  . meloxicam (MOBIC) 7.5 MG tablet [Pharmacy Med Name: MELOXICAM 7.5MG  TABLETS] 90 tablet 3    Sig: TAKE 1 TABLET(7.5 MG) BY MOUTH DAILY     Analgesics:  COX2 Inhibitors Passed - 12/03/2019  6:20 AM      Passed - HGB in normal range and within 360 days    Hemoglobin  Date Value Ref Range Status  10/28/2019 16.0 13.0 - 17.7 g/dL Final         Passed - Cr in normal range and within 360 days    Creat  Date Value Ref Range Status  09/23/2017 1.59 (H) 0.70 - 1.33 mg/dL Final    Comment:    For patients >18 years of age, the reference limit for Creatinine is approximately 13% higher for people identified as African-American. .    Creatinine, Ser  Date Value Ref Range Status  11/30/2019 1.04 0.76 - 1.27 mg/dL Final         Passed - Patient is not pregnant      Passed - Valid encounter within last 12 months    Recent Outpatient Visits          3 days ago Bipolar disorder, current episode manic without psychotic features, severe [facility])   [facility] Jerrol Banana., MD   1 week ago Bipolar disorder, current episode manic without psychotic features, severe [facility])   [facility] Jerrol Banana., MD   2 months ago Diabetes mellitus without complication [facility])   [facility] Jerrol Banana., MD   5 months ago Flu vaccine need   [facility] Jerrol Banana., MD   11 months ago Need for shingles vaccine   [facility] Jerrol Banana., MD

## 2019-12-06 ENCOUNTER — Other Ambulatory Visit: Payer: 59

## 2019-12-06 ENCOUNTER — Telehealth: Payer: Self-pay

## 2019-12-06 NOTE — Telephone Encounter (Signed)
Patient advised, has upcoming appointment on 3/11.

## 2019-12-06 NOTE — Telephone Encounter (Signed)
-----   Message from Jerrol Banana., MD sent at 12/06/2019 12:47 PM EST ----- Labs okay but diabetes very poorly controlled.  Patient check his blood sugar daily and see me in the next couple of weeks.  Copy of labs  to Dr. Margaretha Sheffield in [place].

## 2019-12-10 ENCOUNTER — Other Ambulatory Visit: Payer: 59

## 2019-12-10 ENCOUNTER — Other Ambulatory Visit: Payer: Self-pay

## 2019-12-10 DIAGNOSIS — E349 Endocrine disorder, unspecified: Secondary | ICD-10-CM

## 2019-12-11 LAB — HEMATOCRIT: Hematocrit: 50 % (ref 37.5–51.0)

## 2019-12-11 LAB — TESTOSTERONE: Testosterone: 360 ng/dL (ref 264–916)

## 2019-12-11 LAB — HEMOGLOBIN: Hemoglobin: 17.7 g/dL (ref 13.0–17.7)

## 2019-12-13 ENCOUNTER — Telehealth: Payer: Self-pay

## 2019-12-13 NOTE — Telephone Encounter (Signed)
-----   Message from Nori Riis, PA-C sent at 12/13/2019  7:45 AM EST ----- Mr. Macri needs an office visit at this time with I PSS, SHIM and exam.

## 2019-12-13 NOTE — Telephone Encounter (Signed)
Patient notified and scheduled 

## 2019-12-14 ENCOUNTER — Telehealth: Payer: Self-pay

## 2019-12-14 DIAGNOSIS — E119 Type 2 diabetes mellitus without complications: Secondary | ICD-10-CM

## 2019-12-14 LAB — CBC WITH DIFFERENTIAL/PLATELET
Basophils Absolute: 0.1 10*3/uL (ref 0.0–0.2)
Basos: 1 %
EOS (ABSOLUTE): 0.2 10*3/uL (ref 0.0–0.4)
Eos: 3 %
Hematocrit: 49.4 % (ref 37.5–51.0)
Hemoglobin: 17.6 g/dL (ref 13.0–17.7)
Immature Grans (Abs): 0 10*3/uL (ref 0.0–0.1)
Immature Granulocytes: 0 %
Lymphocytes Absolute: 1.2 10*3/uL (ref 0.7–3.1)
Lymphs: 21 %
MCH: 31.8 pg (ref 26.6–33.0)
MCHC: 35.6 g/dL (ref 31.5–35.7)
MCV: 89 fL (ref 79–97)
Monocytes Absolute: 0.6 10*3/uL (ref 0.1–0.9)
Monocytes: 10 %
Neutrophils Absolute: 3.8 10*3/uL (ref 1.4–7.0)
Neutrophils: 65 %
Platelets: 199 10*3/uL (ref 150–450)
RBC: 5.54 x10E6/uL (ref 4.14–5.80)
RDW: 11.8 % (ref 11.6–15.4)
WBC: 5.8 10*3/uL (ref 3.4–10.8)

## 2019-12-14 LAB — COMPREHENSIVE METABOLIC PANEL
ALT: 63 IU/L — ABNORMAL HIGH (ref 0–44)
AST: 53 IU/L — ABNORMAL HIGH (ref 0–40)
Albumin/Globulin Ratio: 1.7 (ref 1.2–2.2)
Albumin: 4.8 g/dL (ref 3.8–4.8)
Alkaline Phosphatase: 61 IU/L (ref 39–117)
BUN/Creatinine Ratio: 9 — ABNORMAL LOW (ref 10–24)
BUN: 10 mg/dL (ref 8–27)
Bilirubin Total: 0.9 mg/dL (ref 0.0–1.2)
CO2: 23 mmol/L (ref 20–29)
Calcium: 10 mg/dL (ref 8.6–10.2)
Chloride: 98 mmol/L (ref 96–106)
Creatinine, Ser: 1.07 mg/dL (ref 0.76–1.27)
GFR calc Af Amer: 86 mL/min/{1.73_m2} (ref >59)
GFR calc non Af Amer: 75 mL/min/{1.73_m2} (ref >59)
Globulin, Total: 2.8 g/dL (ref 1.5–4.5)
Glucose: 281 mg/dL — ABNORMAL HIGH (ref 65–99)
Potassium: 4.4 mmol/L (ref 3.5–5.2)
Sodium: 136 mmol/L (ref 134–144)
Total Protein: 7.6 g/dL (ref 6.0–8.5)

## 2019-12-14 LAB — LIPID PANEL
Chol/HDL Ratio: 1.9 ratio (ref 0.0–5.0)
Cholesterol, Total: 114 mg/dL (ref 100–199)
HDL: 59 mg/dL (ref >39)
LDL Chol Calc (NIH): 41 mg/dL (ref 0–99)
Triglycerides: 65 mg/dL (ref 0–149)
VLDL Cholesterol Cal: 14 mg/dL (ref 5–40)

## 2019-12-14 LAB — HEMOGLOBIN A1C
Est. average glucose Bld gHb Est-mCnc: 235 mg/dL
Hgb A1c MFr Bld: 9.8 % — ABNORMAL HIGH (ref 4.8–5.6)

## 2019-12-14 MED ORDER — PIOGLITAZONE HCL 15 MG PO TABS: 15.0000 mg | ORAL_TABLET | Freq: Every morning | ORAL | 2 refills | Status: DC

## 2019-12-14 NOTE — Telephone Encounter (Signed)
-----   Message from Jerrol Banana., MD sent at 12/14/2019  2:20 PM EST ----- Labs stable but blood sugar is higher with diabetes.  Add pioglitazone 15 mg every morning.  Keep follow-up appointment.

## 2019-12-14 NOTE — Telephone Encounter (Signed)
Patient advised and medication sent to pharmacy  

## 2019-12-14 NOTE — Progress Notes (Signed)
Patient: Jeremiah [name], Male    DOB: 1961/09/12, 59 y.o.   MRN: [id number] Visit Date: 12/16/2019  Today's Provider: [name], MD   Chief Complaint  Patient presents with  . Annual Exam   Subjective:     Annual physical exam Jeremiah [name] is a 59 y.o. male who presents today for health maintenance and complete physical. He feels fairly well. He reports exercising yes/walking. He reports he is sleeping fairly well. He is feeling much better emotionally.  Blood sugars are fair but he has not started the pioglitazone I sent in for him for his diabetes. -----------------------------------------------------------  Colonoscopy: 11/25/2017  Review of Systems  Constitutional: Negative.   HENT: Positive for tinnitus.   Eyes: Negative.   Respiratory: Positive for apnea.   Endocrine: Negative.   Genitourinary: Positive for urgency.  Musculoskeletal: Positive for back pain.  Allergic/Immunologic: Negative.   Neurological: Negative.   Hematological: Negative.   Psychiatric/Behavioral: Positive for agitation and decreased concentration.    Social History      He  reports that he has never smoked. He quit smokeless tobacco use about 30 years ago.  His smokeless tobacco use included chew. He reports current alcohol use of about 12.0 standard drinks of alcohol per week. He reports that he does not use drugs.       Social History   Socioeconomic History  . Marital status: Married    Spouse name: Not on file  . Number of children: Not on file  . Years of education: Not on file  . Highest education level: Not on file  Occupational History  . Not on file  Tobacco Use  . Smoking status: Never Smoker  . Smokeless tobacco: Former Systems developer    Types: Chew  Substance and Sexual Activity  . Alcohol use: Yes    Alcohol/week: 12.0 standard drinks    Types: 12 Cans of beer per week  . Drug use: No  . Sexual activity: Yes    Partners: Female    Birth control/protection: None    Other Topics Concern  . Not on file  Social History Narrative  . Not on file   Social Determinants of Health   Financial Resource Strain:   . Difficulty of Paying Living Expenses:   Food Insecurity:   . Worried About Charity fundraiser in the Last Year:   . Arboriculturist in the Last Year:   Transportation Needs:   . Film/video editor (Medical):   [name] Lack of Transportation (Non-Medical):   Physical Activity:   . Days of Exercise per Week:   . Minutes of Exercise per Session:   Stress:   . Feeling of Stress :   Social Connections:   . Frequency of Communication with Friends and Family:   . Frequency of Social Gatherings with Friends and Family:   . Attends Religious Services:   . Active Member of Clubs or Organizations:   . Attends Archivist Meetings:   [name] Marital Status:     Past Medical History:  Diagnosis Date  . Anxiety   . Arthritis    bil knees  . Bipolar disorder ([place])   . Colon polyp Dec 2015   Dr Allen Norris  . Complication of anesthesia   . Depression    dx 2004  . Diabetes mellitus without complication ([place])    type ll  controlled by weight & diet  . GERD (gastroesophageal reflux disease)   .  Gout   . H/O hiatal hernia    had surgery for that 2012  [facility]  . Heartburn   . Hypertension   . Hypothyroidism   . PONV (postoperative nausea and vomiting)   . Sleep apnea    dx 1.5 yr ago...study @ [facility], does not use CPAP     Patient Active Problem List   Diagnosis Date Noted  . Low testosterone 06/01/2018  . Degeneration of lumbar intervertebral disc 12/09/2017  . Benign neoplasm of cecum   . Left inguinal hernia 09/08/2017  . Right inguinal hernia 09/08/2017  . Umbilical hernia without obstruction and without gangrene 09/08/2017  . Allergic rhinitis due to pollen 06/23/2017  . Chest pain 03/05/2016  . Pedal edema 03/08/2015  . History of repair of rotator cuff 02/17/2014  . Diabetes ([place]) 08/31/2013  . BP (high blood pressure)  08/31/2013  . Adult hypothyroidism 08/31/2013  . Obstructive apnea 08/31/2013  . Clinical depression 08/31/2013  . Diabetes mellitus ([place]) 08/31/2013  . Idiopathic localized osteoarthropathy 05/31/2013  . ABDOMINAL PAIN, LEFT UPPER QUADRANT 10/04/2009  . TARSAL TUNNEL SYNDROME, RIGHT 09/25/2009  . ANKLE PAIN, RIGHT 09/25/2009  . OTHER ANKLE SPRAIN AND STRAIN 09/25/2009  . BREAST MASS, RIGHT 01/07/2008  . INSOMNIA 01/07/2008  . GASTRIC ULCER, ACUTE 11/09/2007  . DUODENITIS 11/09/2007  . BLOOD IN STOOL, OCCULT 11/09/2007  . PERSONAL HISTORY OF COLONIC POLYPS 11/09/2007  . BACK PAIN, LUMBAR 10/12/2007  . HELICOBACTER PYLORI GASTRITIS 09/09/2007  . GASTRIC ULCER, ACUTE, HEMORRHAGE 09/09/2007  . Hiatal hernia 09/09/2007  . COLONIC POLYPS, ADENOMATOUS 08/19/2007  . INTERNAL HEMORRHOIDS 08/19/2007  . EXTERNAL HEMORRHOIDS WITHOUT MENTION COMP 08/07/2007  . Blood in stool 08/07/2007  . ABDOMINAL PAIN, LEFT LOWER QUADRANT 08/07/2007  . SINUSITIS, ACUTE 06/12/2007  . ABDOMINAL PAIN 06/12/2007  . DIABETES MELLITUS, TYPE II 03/19/2007  . GOUT 03/19/2007  . DISORDER, CIRCADIAN RHYTHM SLEEP, NONORGANIC 03/19/2007  . Depression 03/19/2007  . OSTEOARTHROSIS, GENERALIZED, MULTIPLE SITES 03/19/2007    Past Surgical History:  Procedure Laterality Date  . APPENDECTOMY    . COLONOSCOPY  Dec 2015   Dr Allen Norris  . COLONOSCOPY WITH PROPOFOL N/A 11/25/2017   Procedure: COLONOSCOPY WITH PROPOFOL;  Surgeon: Lucilla Lame, MD;  Location: [facility] ENDOSCOPY;  Service: Endoscopy;  Laterality: N/A;  . EXCISIONAL HEMORRHOIDECTOMY  01/02/15  . FOOT SURGERY     right foot 2012  . HERNIA REPAIR     Left inguinal  . HERNIA REPAIR     hiatal hernia at [place]  . INGUINAL HERNIA REPAIR Left 10/31/2017   Procedure: OPEN LEFT INGUINAL RECURRENT HERNIA REPAIR;  Surgeon: Johnathan Hausen, MD;  Location: [place];  Service: General;  Laterality: Left;  [name] MANDIBLE SURGERY     1983  . REPLACEMENT TOTAL KNEE  BILATERAL     patient had knee replacement to his right side in 2015 and right 2014, Revision of R knee 12/04/16  . ROTATOR CUFF REPAIR     x 3  on right shoulder  . SHOULDER ARTHROSCOPY WITH ROTATOR CUFF REPAIR AND SUBACROMIAL DECOMPRESSION Left 03/04/2013   Procedure: LEFT SHOULDER ARTHROSCOPY WITH ROTATOR CUFF REPAIR AND SUBACROMIAL DECOMPRESSION;  Surgeon: Marin Shutter, MD;  Location: [place];  Service: Orthopedics;  Laterality: Left;  . SHOULDER SURGERY     x 3 on left    Family History        Family Status  Relation Name Status  . Mother  Alive  . Father  Alive  .  Sister  Alive  . MGM  Deceased  . MGF  Deceased  . PGM  Deceased  . Son  Alive  . PGF  Deceased  . Neg Hx  (Not Specified)        His family history includes Angina in his mother; Arthritis in his mother; Asthma in his sister; Bipolar disorder in his sister; CAD in his mother; Cancer in his maternal grandmother; Colon polyps in his father; Diabetes in his father and maternal grandmother; Heart attack in his father and maternal grandfather; Heart disease in his father; Schizophrenia in his sister; Stomach cancer in his paternal grandmother. There is no history of Kidney disease, Prostate cancer, or Other.      Allergies  Allergen Reactions  . Tequin [Gatifloxacin] Anaphylaxis and Rash  . Amoxicillin Hives     Current Outpatient Medications:  .  allopurinol (ZYLOPRIM) 100 MG tablet, TAKE 1 TABLET(100 MG) BY MOUTH DAILY, Disp: 30 tablet, Rfl: 11 .  amLODipine (NORVASC) 10 MG tablet, Take 5 mg by mouth daily., Disp: , Rfl:  .  ARIPiprazole (ABILIFY) 2 MG tablet, Take 5 tablets by mouth 2 times daily, Disp: 60 tablet, Rfl: 0 .  aspirin EC 81 MG tablet, Take 81 mg by mouth daily., Disp: , Rfl:  .  buPROPion (WELLBUTRIN SR) 100 MG 12 hr tablet, Take 1 tablet (100 mg total) by mouth daily., Disp: 30 tablet, Rfl: 1 .  EPIPEN 2-PAK 0.3 MG/0.3ML SOAJ injection, , Disp: , Rfl:  .  fluticasone (FLONASE) 50 MCG/ACT nasal  spray, Place 2 sprays into both nostrils daily., Disp: 16 g, Rfl: 1 .  HYDROcodone-acetaminophen (NORCO) 10-325 MG tablet, Take 1 tablet by mouth every 4 (four) hours as needed for severe pain., Disp: 20 tablet, Rfl: 0 .  levothyroxine (SYNTHROID) 25 MCG tablet, TAKE 1 TABLET BY MOUTH DAILY BEFORE BREAKFAST, Disp: 90 tablet, Rfl: 3 .  lithium carbonate (ESKALITH) 450 MG CR tablet, Take 1 tablet (450 mg total) by mouth daily. (Patient taking differently: Take 900 mg by mouth daily. ), Disp: 90 tablet, Rfl: 1 .  loratadine (CLARITIN) 10 MG tablet, Take 10 mg by mouth daily., Disp: , Rfl:  .  meloxicam (MOBIC) 7.5 MG tablet, TAKE 1 TABLET(7.5 MG) BY MOUTH DAILY, Disp: 90 tablet, Rfl: 3 .  metFORMIN (GLUCOPHAGE) 1000 MG tablet, Take 1 tablet (1,000 mg total) by mouth 2 (two) times daily with a meal., Disp: 60 tablet, Rfl: 12 .  montelukast (SINGULAIR) 10 MG tablet, TK 1 T PO QPM, Disp: , Rfl: 2 .  Multiple Vitamin (MULTIVITAMIN WITH MINERALS) TABS, Take 1 tablet by mouth daily., Disp: , Rfl:  .  omeprazole (PRILOSEC) 20 MG capsule, Take 20 mg by mouth daily., Disp: , Rfl:  .  pioglitazone (ACTOS) 15 MG tablet, Take 1 tablet (15 mg total) by mouth in the morning., Disp: 30 tablet, Rfl: 2 .  Testosterone Enanthate (XYOSTED) 75 MG/0.5ML SOAJ, Inject 75 mg into the skin every 7 (seven) days., Disp: 4 pen, Rfl: 1 .  zolpidem (AMBIEN) 10 MG tablet, Take 1 tablet (10 mg total) by mouth at bedtime as needed for sleep., Disp: 30 tablet, Rfl: 1 .  cyclobenzaprine (FLEXERIL) 5 MG tablet, Take 1 tablet (5 mg total) by mouth 3 (three) times daily as needed for muscle spasms. (Patient not taking: Reported on 12/16/2019), Disp: 21 tablet, Rfl: 0   Patient Care Team: Jerrol Banana., MD as PCP - General (Unknown Physician Specialty) Jerrol Banana., MD (Family  Medicine) Christene Lye, MD (General Surgery)    Objective:    Vitals: BP 122/89 (BP Location: Left Arm, Patient Position: Sitting,  Cuff Size: Normal)   Pulse 60   Temp (!) 96.9 F (36.1 C) (Temporal)   Resp 17   Ht 5\' 10"  (1.778 m)   Wt 208 lb (94.3 kg)   SpO2 95%   BMI 29.84 kg/m    Vitals:   12/16/19 0900  BP: 122/89  Pulse: 60  Resp: 17  Temp: (!) 96.9 F (36.1 C)  TempSrc: Temporal  SpO2: 95%  Weight: 208 lb (94.3 kg)  Height: 5\' 10"  (1.778 m)     Physical Exam Vitals reviewed.  Constitutional:      Appearance: He is well-developed.  HENT:     Head: Normocephalic and atraumatic.     Right Ear: External ear normal.     Left Ear: External ear normal.     Nose: Nose normal.  Eyes:     General: No scleral icterus.    Conjunctiva/sclera: Conjunctivae normal.     Pupils: Pupils are equal, round, and reactive to light.  Neck:     Thyroid: No thyromegaly.  Cardiovascular:     Rate and Rhythm: Normal rate and regular rhythm.     Heart sounds: Murmur present.     Comments: Soft early 2/6 systolic murmur. Pulmonary:     Effort: Pulmonary effort is normal.     Breath sounds: Normal breath sounds.  Abdominal:     Palpations: Abdomen is soft.  Genitourinary:    Penis: Normal.      Testes: Normal.  Musculoskeletal:        General: Deformity present.     Comments: He is unable to lift his left shoulder to any meaningful degree.  This is a chronic issue.  Skin:    General: Skin is warm and dry.  Neurological:     General: No focal deficit present.     Mental Status: He is alert and oriented to person, place, and time.  Psychiatric:        Mood and Affect: Mood normal.        Behavior: Behavior normal.        Thought Content: Thought content normal.        Judgment: Judgment normal.      Depression Screen PHQ 2/9 Scores 11/24/2019 12/15/2018 09/23/2017 12/31/2016  PHQ - 2 Score 6 0 3 2  PHQ- 9 Score 27 - 14 5       Assessment & Plan:     Routine Health Maintenance and Physical Exam  Exercise Activities and Dietary recommendations Goals   None     Immunization History    Administered Date(s) Administered  . Influenza, Seasonal, Injecte, Preservative Fre 07/01/2017  . Influenza,inj,Quad PF,6+ Mos 08/04/2018, 06/17/2019  . Influenza,inj,quad, With Preservative 08/27/2017  . PFIZER SARS-COV-2 Vaccination 11/08/2019, 11/30/2019  . Td 02/22/2003  . Tdap 07/02/2011  . Zoster Recombinat (Shingrix) 10/22/2018, 12/23/2018    Health Maintenance  Topic Date Due  . PNEUMOCOCCAL POLYSACCHARIDE VACCINE AGE 78-64 HIGH RISK  Never done  . OPHTHALMOLOGY EXAM  Never done  . HIV Screening  Never done  . FOOT EXAM  03/17/2019  . URINE MICROALBUMIN  03/17/2019  . HEMOGLOBIN A1C  06/14/2020  . TETANUS/TDAP  07/01/2021  . COLONOSCOPY  11/25/2022  . INFLUENZA VACCINE  Completed  . Hepatitis C Screening  Completed     Discussed health benefits of physical activity, and encouraged him  to engage in regular exercise appropriate for his age and condition.    --------------------------------------------------------------------  1. Annual physical exam Patient given Pneumovax and hepatitis B because of his diabetes. - POCT urinalysis dipstick  2. Type 2 diabetes mellitus without complication, unspecified whether long term insulin use (HCC) Start pioglitazone 15 mg daily.  Follow-up 3 months. - POCT UA - Microalbumin - Pneumococcal polysaccharide vaccine 23-valent greater than or equal to 2yo subcutaneous/IM - Hepatitis B vaccine adult IM  3. Need for pneumococcal vaccination   4. Need for hepatitis B vaccination  - Pneumococcal polysaccharide vaccine 23-valent greater than or equal to 2yo subcutaneous/IM - Hepatitis B vaccine adult IM . 5. Bipolar depression Per Dr Toy Care.  Follow up in one month for 2nd Hep vaccine. Follow up in 3 months for office visit.     I,Amoura Ransier,acting as a scribe for [name], MD.,have documented all relevant documentation on the behalf of [name], MD,as directed by  [name], MD while in the  presence of [name], MD.    [name], MD  [facility]

## 2019-12-16 ENCOUNTER — Ambulatory Visit (INDEPENDENT_AMBULATORY_CARE_PROVIDER_SITE_OTHER): Payer: 59 | Admitting: Family Medicine

## 2019-12-16 ENCOUNTER — Other Ambulatory Visit: Payer: Self-pay

## 2019-12-16 ENCOUNTER — Encounter: Payer: Self-pay | Admitting: Family Medicine

## 2019-12-16 VITALS — BP 122/89 | HR 60 | Temp 96.9°F | Resp 17 | Ht 70.0 in | Wt 208.0 lb

## 2019-12-16 DIAGNOSIS — F3113 Bipolar disorder, current episode manic without psychotic features, severe: Secondary | ICD-10-CM | POA: Diagnosis not present

## 2019-12-16 DIAGNOSIS — Z23 Encounter for immunization: Secondary | ICD-10-CM

## 2019-12-16 DIAGNOSIS — Z0000 Encounter for general adult medical examination without abnormal findings: Secondary | ICD-10-CM | POA: Diagnosis not present

## 2019-12-16 DIAGNOSIS — E119 Type 2 diabetes mellitus without complications: Secondary | ICD-10-CM | POA: Diagnosis not present

## 2019-12-16 LAB — POCT URINALYSIS DIPSTICK
Bilirubin, UA: NEGATIVE
Blood, UA: NEGATIVE
Glucose, UA: POSITIVE — AB
Ketones, UA: NEGATIVE
Leukocytes, UA: NEGATIVE
Nitrite, UA: NEGATIVE
Protein, UA: NEGATIVE
Spec Grav, UA: 1.01 (ref 1.010–1.025)
Urobilinogen, UA: 0.2 E.U./dL
pH, UA: 5 (ref 5.0–8.0)

## 2019-12-16 LAB — POCT UA - MICROALBUMIN: Microalbumin Ur, POC: NEGATIVE mg/L

## 2019-12-16 NOTE — Patient Instructions (Addendum)
Start pioglitazone 15 mg daily. Follow up in 3 months.

## 2019-12-22 NOTE — Progress Notes (Incomplete)
12/23/2019 10:49 PM   [name] 06/06/1961 [id number]  Referring provider: Jerrol Banana., MD [address] [place],  [place] [address]  No chief complaint on file.   HPI: NEMO SUDBURY is a 59 y.o. White male with testosterone deficiency, BPH with LU TS and ED who returns today for follow up on DRE, IPSS, and SHIM.    Testosterone deficiency He is still having spontaneous erections at night.  He has sleep apnea and is sleeping with a CPAP machine.  He is currently managing his hypogonadism with testosterone cypionate 200 mg/IM, 0.75 mg every week.  His current testosterone level is 508 ng/dL on 06/22/2019.  His HCT is 55.1% and hemoglobin is 18.9.    BPH WITH LUTS  (prostate and/or bladder) IPSS score: 11/2   Previous score: 16/2  (switch with 11/2 if new IPSS score)   Score:  1-7 Mild 8-19 Moderate 20-35 Severe  Erectile dysfunction His SHIM score is 13, which is mild to moderate ED.   His previous SHIM score was 7.  He has been having difficulty with erections for over two years. His major complaint is lack of firmness with erections.  His libido is diminshed.   His risk factors for ED are age, BPH, testosterone deficiency, DM, HTN, sleep apnea and antidepressants.  He denies any painful erections or curvatures with his erections.   He is still having spontaneous erections.  He has tried Viagra in the past and it is not very effective.  His wife is not wanting him to take PDE5i's as she feels he will have a heart attack.     Score: 1-7 Severe ED 8-11 Moderate ED 12-16 Mild-Moderate ED 17-21 Mild ED 22-25 No ED      PMH: Past Medical History:  Diagnosis Date  . Anxiety   . Arthritis    bil knees  . Bipolar disorder ([place])   . Colon polyp Dec 2015   Dr Allen Norris  . Complication of anesthesia   . Depression    dx 2004  . Diabetes mellitus without complication ([place])    type ll  controlled by weight & diet  . GERD (gastroesophageal reflux  disease)   . Gout   . H/O hiatal hernia    had surgery for that 2012  [facility]  . Heartburn   . Hypertension   . Hypothyroidism   . PONV (postoperative nausea and vomiting)   . Sleep apnea    dx 1.5 yr ago...study @ [facility], does not use CPAP    Surgical History: Past Surgical History:  Procedure Laterality Date  . APPENDECTOMY    . COLONOSCOPY  Dec 2015   Dr Allen Norris  . COLONOSCOPY WITH PROPOFOL N/A 11/25/2017   Procedure: COLONOSCOPY WITH PROPOFOL;  Surgeon: Lucilla Lame, MD;  Location: [facility] ENDOSCOPY;  Service: Endoscopy;  Laterality: N/A;  . EXCISIONAL HEMORRHOIDECTOMY  01/02/15  . FOOT SURGERY     right foot 2012  . HERNIA REPAIR     Left inguinal  . HERNIA REPAIR     hiatal hernia at [place]  . INGUINAL HERNIA REPAIR Left 10/31/2017   Procedure: OPEN LEFT INGUINAL RECURRENT HERNIA REPAIR;  Surgeon: Johnathan Hausen, MD;  Location: [place];  Service: General;  Laterality: Left;  [name] MANDIBLE SURGERY     1983  . REPLACEMENT TOTAL KNEE BILATERAL     patient had knee replacement to his right side in 2015 and right 2014, Revision of  R knee 12/04/16  . ROTATOR CUFF REPAIR     x 3  on right shoulder  . SHOULDER ARTHROSCOPY WITH ROTATOR CUFF REPAIR AND SUBACROMIAL DECOMPRESSION Left 03/04/2013   Procedure: LEFT SHOULDER ARTHROSCOPY WITH ROTATOR CUFF REPAIR AND SUBACROMIAL DECOMPRESSION;  Surgeon: Marin Shutter, MD;  Location: [place];  Service: Orthopedics;  Laterality: Left;  . SHOULDER SURGERY     x 3 on left    Home Medications:  Allergies as of 12/23/2019      Reactions   Tequin [gatifloxacin] Anaphylaxis, Rash   Amoxicillin Hives      Medication List       Accurate as of December 22, 2019 10:49 PM. If you have any questions, ask your nurse or doctor.        allopurinol 100 MG tablet Commonly known as: ZYLOPRIM TAKE 1 TABLET(100 MG) BY MOUTH DAILY   amLODipine 10 MG tablet Commonly known as: NORVASC Take 5 mg by mouth daily.   ARIPiprazole 2 MG  tablet Commonly known as: Abilify Take 5 tablets by mouth 2 times daily   aspirin EC 81 MG tablet Take 81 mg by mouth daily.   buPROPion 100 MG 12 hr tablet Commonly known as: Wellbutrin SR Take 1 tablet (100 mg total) by mouth daily.   cyclobenzaprine 5 MG tablet Commonly known as: FLEXERIL Take 1 tablet (5 mg total) by mouth 3 (three) times daily as needed for muscle spasms.   EpiPen 2-Pak 0.3 mg/0.3 mL Soaj injection Generic drug: EPINEPHrine   fluticasone 50 MCG/ACT nasal spray Commonly known as: FLONASE Place 2 sprays into both nostrils daily.   HYDROcodone-acetaminophen 10-325 MG tablet Commonly known as: NORCO Take 1 tablet by mouth every 4 (four) hours as needed for severe pain.   levothyroxine 25 MCG tablet Commonly known as: SYNTHROID TAKE 1 TABLET BY MOUTH DAILY BEFORE BREAKFAST   lithium carbonate 450 MG CR tablet Commonly known as: ESKALITH Take 1 tablet (450 mg total) by mouth daily. What changed: how much to take   loratadine 10 MG tablet Commonly known as: CLARITIN Take 10 mg by mouth daily.   meloxicam 7.5 MG tablet Commonly known as: MOBIC TAKE 1 TABLET(7.5 MG) BY MOUTH DAILY   metFORMIN 1000 MG tablet Commonly known as: GLUCOPHAGE Take 1 tablet (1,000 mg total) by mouth 2 (two) times daily with a meal.   montelukast 10 MG tablet Commonly known as: SINGULAIR TK 1 T PO QPM   multivitamin with minerals Tabs tablet Take 1 tablet by mouth daily.   omeprazole 20 MG capsule Commonly known as: PRILOSEC Take 20 mg by mouth daily.   pioglitazone 15 MG tablet Commonly known as: ACTOS Take 1 tablet (15 mg total) by mouth in the morning.   Xyosted 75 MG/0.5ML Soaj Generic drug: Testosterone Enanthate Inject 75 mg into the skin every 7 (seven) days.   zolpidem 10 MG tablet Commonly known as: AMBIEN Take 1 tablet (10 mg total) by mouth at bedtime as needed for sleep.       Allergies:  Allergies  Allergen Reactions  . Tequin  [Gatifloxacin] Anaphylaxis and Rash  . Amoxicillin Hives    Family History: Family History  Problem Relation Age of Onset  . CAD Mother   . Arthritis Mother   . Angina Mother   . Diabetes Father   . Heart disease Father   . Colon polyps Father   . Heart attack Father   . Bipolar disorder Sister   . Asthma Sister   .  Schizophrenia Sister   . Diabetes Maternal Grandmother   . Cancer Maternal Grandmother   . Heart attack Maternal Grandfather   . Stomach cancer Paternal Grandmother   . Kidney disease Neg Hx   . Prostate cancer Neg Hx   . Other Neg Hx        hypogonadism    Social History:  reports that he has never smoked. He quit smokeless tobacco use about 30 years ago.  His smokeless tobacco use included chew. He reports current alcohol use of about 12.0 standard drinks of alcohol per week. He reports that he does not use drugs.  ROS:                                        Physical Exam: There were no vitals taken for this visit.  Constitutional:  Well nourished. Alert and oriented, No acute distress. HEENT:  AT, moist mucus membranes.  Trachea midline, no masses. Cardiovascular: No clubbing, cyanosis, or edema. Respiratory: Normal respiratory effort, no increased work of breathing. GI: Abdomen is soft, non tender, non distended, no abdominal masses. Liver and spleen not palpable.  No hernias appreciated.  Stool sample for occult testing is not indicated.   GU: No CVA tenderness.  No bladder fullness or masses.  Patient with circumcised/uncircumcised phallus. ***Foreskin easily retracted***  Urethral meatus is patent.  No penile discharge. No penile lesions or rashes. Scrotum without lesions, cysts, rashes and/or edema.  Testicles are located scrotally bilaterally. No masses are appreciated in the testicles. Left and right epididymis are normal. Rectal: Patient with  normal sphincter tone. Anus and perineum without scarring or rashes. No rectal masses  are appreciated. Prostate is approximately *** grams, *** nodules are appreciated. Seminal vesicles are normal. Skin: No rashes, bruises or suspicious lesions. Lymph: No cervical or inguinal adenopathy. Neurologic: Grossly intact, no focal deficits, moving all 4 extremities. Psychiatric: Normal mood and affect.   Laboratory Data: Component     Latest Ref Rng & Units 12/16/2019  Color, UA      Yellow  Clarity, UA        Glucose     Negative Positive (A)  Bilirubin, UA      Negative  Ketones, UA      Negative  Specific Gravity, UA     1.010 - 1.025 1.010  RBC, UA      Negative  pH, UA     5.0 - 8.0 5.0  Protein,UA     Negative Negative  Urobilinogen, UA     0.2 or 1.0 E.U./dL 0.2  Nitrite, UA      Negative  Leukocytes,UA     Negative Negative   Component     Latest Ref Rng & Units 12/16/2019  Microalbumin Ur, POC     mg/L Negative     Component     Latest Ref Rng & Units 10/28/2019 12/10/2019  Testosterone     264 - 916 ng/dL 125 (L) 360    Assessment & Plan:    1. Testosterone deficiency  Most recent testosterone level is 508 ng/dL on 06/22/2019 (goal 450-600 ng/dL)  2. Erythrocytosis  Patient's HCT and hemoglobin has returned to normal, from the previos HCT of 54% or greater. He is able to restart testosterone injections starting with 0.5cc/week 3. BPH with LUTS IPSS score is 11/2, it is improving Continue conservative management, avoiding bladder irritants and timed voiding's  RTC in 6 months for IPSS, PSA and exam, as testosterone therapy can cause prostate enlargement and worsen LUTS  4. Erectile dysfunction:    *SHIM score is 50 Wife is not interested in PDE5i's due to fear of heart attack He is not interested in [place], [place] [id number] [phone]  I, Stacie Glaze, am acting as a Education administrator for [employer], PA-C.  {Add Scribe Attestation Statement}

## 2019-12-23 ENCOUNTER — Ambulatory Visit: Payer: 59 | Admitting: Urology

## 2019-12-28 ENCOUNTER — Other Ambulatory Visit: Payer: Self-pay

## 2019-12-28 ENCOUNTER — Ambulatory Visit: Payer: 59 | Admitting: Urology

## 2019-12-28 ENCOUNTER — Encounter: Payer: Self-pay | Admitting: Urology

## 2019-12-28 VITALS — BP 114/76 | HR 93 | Ht 70.0 in | Wt 208.0 lb

## 2019-12-28 DIAGNOSIS — N138 Other obstructive and reflux uropathy: Secondary | ICD-10-CM

## 2019-12-28 DIAGNOSIS — D751 Secondary polycythemia: Secondary | ICD-10-CM

## 2019-12-28 DIAGNOSIS — E349 Endocrine disorder, unspecified: Secondary | ICD-10-CM | POA: Diagnosis not present

## 2019-12-28 DIAGNOSIS — N401 Enlarged prostate with lower urinary tract symptoms: Secondary | ICD-10-CM

## 2019-12-28 DIAGNOSIS — N529 Male erectile dysfunction, unspecified: Secondary | ICD-10-CM | POA: Diagnosis not present

## 2019-12-28 MED ORDER — TADALAFIL 20 MG PO TABS: 20.0000 mg | ORAL_TABLET | Freq: Every day | ORAL | 5 refills | Status: AC | PRN

## 2019-12-28 NOTE — Progress Notes (Signed)
12/23/2019 3:11 PM   Jeremiah [name] Jun 04, 1961 [id number]  Referring provider: Jerrol Banana., MD [address] [place],  [place] [address]  Chief Complaint  Patient presents with  . Follow-up    HPI: Jeremiah [name] is a 59 y.o. male with testosterone deficiency, BPH with LU TS and ED who returns today for 6 month follow up.    Testosterone deficiency He is no longer having spontaneous erections at night.  He has sleep apnea and is sleeping with a CPAP machine.  He is currently managing his hypogonadism with testosterone cypionate 200 mg/IM, 0.75 mg every week.  His current testosterone level is 360 ng/dL on 12/10/2019.  His HCT is 50.0% and hemoglobin is 17.7.  He states he did not feel any different while he was off his testosterone therapy for the two months.    BPH WITH LUTS  (prostate and/or bladder) IPSS score: 7/2   Previous score: 11/2   Previous PVR: 37 mL   Major complaint(s): No complaints.  Denies any dysuria, hematuria or suprapubic pain.  Denies any recent fevers, chills, nausea or vomiting.  He does not have a family history of PCa.  IPSS    Row Name 12/28/19 1500         International Prostate Symptom Score   How often have you had the sensation of not emptying your bladder?  Not at All     How often have you had to urinate less than every two hours?  About half the time     How often have you found you stopped and started again several times when you urinated?  Less than 1 in 5 times     How often have you found it difficult to postpone urination?  Not at All     How often have you had a weak urinary stream?  Less than half the time     How often have you had to strain to start urination?  Not at All     How many times did you typically get up at night to urinate?  1 Time     Total IPSS Score  7       Quality of Life due to urinary symptoms   If you were to spend the rest of your life with your urinary condition just the way it is now  how would you feel about that?  Mostly Satisfied        Score:  1-7 Mild 8-19 Moderate 20-35 Severe   Erectile dysfunction His SHIM score is 8, which is moderate ED.   His previous SHIM score was 13.  He has been having difficulty with erections for over two years. His major complaint is lack of firmness with erections.  His libido is diminshed.   His risk factors for ED are age, BPH, testosterone deficiency, DM, HTN, sleep apnea and antidepressants.  He denies any painful erections or curvatures with his erections.   He is not having spontaneous erections.  He had a running nose and headache with Viagra.  He would like to try Cialis.   SHIM    Row Name 12/28/19 1505         SHIM: Over the last 6 months:   How do you rate your confidence that you could get and keep an erection?  Very Low     When you had erections with sexual stimulation, how often were your erections hard enough for penetration (entering  your partner)?  Almost Never or Never     During sexual intercourse, how often were you able to maintain your erection after you had penetrated (entered) your partner?  Almost Never or Never     During sexual intercourse, how difficult was it to maintain your erection to completion of intercourse?  Very Difficult     When you attempted sexual intercourse, how often was it satisfactory for you?  Sometimes (about half the time)       SHIM Total Score   SHIM  8        Score: 1-7 Severe ED 8-11 Moderate ED 12-16 Mild-Moderate ED 17-21 Mild ED 22-25 No ED      PMH: Past Medical History:  Diagnosis Date  . Anxiety   . Arthritis    bil knees  . Bipolar disorder ([place])   . Colon polyp Dec 2015   Dr Allen Norris  . Complication of anesthesia   . Depression    dx 2004  . Diabetes mellitus without complication ([place])    type ll  controlled by weight & diet  . GERD (gastroesophageal reflux disease)   . Gout   . H/O hiatal hernia    had surgery for that 2012  [facility]  .  Heartburn   . Hypertension   . Hypothyroidism   . PONV (postoperative nausea and vomiting)   . Sleep apnea    dx 1.5 yr ago...study @ [facility], does not use CPAP    Surgical History: Past Surgical History:  Procedure Laterality Date  . APPENDECTOMY    . COLONOSCOPY  Dec 2015   Dr Allen Norris  . COLONOSCOPY WITH PROPOFOL N/A 11/25/2017   Procedure: COLONOSCOPY WITH PROPOFOL;  Surgeon: Lucilla Lame, MD;  Location: [facility] ENDOSCOPY;  Service: Endoscopy;  Laterality: N/A;  . EXCISIONAL HEMORRHOIDECTOMY  01/02/15  . FOOT SURGERY     right foot 2012  . HERNIA REPAIR     Left inguinal  . HERNIA REPAIR     hiatal hernia at [place]  . INGUINAL HERNIA REPAIR Left 10/31/2017   Procedure: OPEN LEFT INGUINAL RECURRENT HERNIA REPAIR;  Surgeon: Johnathan Hausen, MD;  Location: [place];  Service: General;  Laterality: Left;  [name] MANDIBLE SURGERY     1983  . REPLACEMENT TOTAL KNEE BILATERAL     patient had knee replacement to his right side in 2015 and right 2014, Revision of R knee 12/04/16  . ROTATOR CUFF REPAIR     x 3  on right shoulder  . SHOULDER ARTHROSCOPY WITH ROTATOR CUFF REPAIR AND SUBACROMIAL DECOMPRESSION Left 03/04/2013   Procedure: LEFT SHOULDER ARTHROSCOPY WITH ROTATOR CUFF REPAIR AND SUBACROMIAL DECOMPRESSION;  Surgeon: Marin Shutter, MD;  Location: [place];  Service: Orthopedics;  Laterality: Left;  . SHOULDER SURGERY     x 3 on left    Home Medications:  Allergies as of 12/28/2019      Reactions   Tequin [gatifloxacin] Anaphylaxis, Rash   Amoxicillin Hives      Medication List       Accurate as of December 28, 2019  3:11 PM. If you have any questions, ask your nurse or doctor.        allopurinol 100 MG tablet Commonly known as: ZYLOPRIM TAKE 1 TABLET(100 MG) BY MOUTH DAILY   amLODipine 10 MG tablet Commonly known as: NORVASC Take 5 mg by mouth daily.   ARIPiprazole 2 MG tablet Commonly known as: Abilify Take 5 tablets by mouth 2 times  daily   aspirin EC 81 MG  tablet Take 81 mg by mouth daily.   buPROPion 100 MG 12 hr tablet Commonly known as: Wellbutrin SR Take 1 tablet (100 mg total) by mouth daily.   cyclobenzaprine 5 MG tablet Commonly known as: FLEXERIL Take 1 tablet (5 mg total) by mouth 3 (three) times daily as needed for muscle spasms.   EpiPen 2-Pak 0.3 mg/0.3 mL Soaj injection Generic drug: EPINEPHrine   fluticasone 50 MCG/ACT nasal spray Commonly known as: FLONASE Place 2 sprays into both nostrils daily.   HYDROcodone-acetaminophen 10-325 MG tablet Commonly known as: NORCO Take 1 tablet by mouth every 4 (four) hours as needed for severe pain.   levothyroxine 25 MCG tablet Commonly known as: SYNTHROID TAKE 1 TABLET BY MOUTH DAILY BEFORE BREAKFAST   lithium carbonate 450 MG CR tablet Commonly known as: ESKALITH Take 1 tablet (450 mg total) by mouth daily. What changed: how much to take   loratadine 10 MG tablet Commonly known as: CLARITIN Take 10 mg by mouth daily.   meloxicam 7.5 MG tablet Commonly known as: MOBIC TAKE 1 TABLET(7.5 MG) BY MOUTH DAILY   metFORMIN 1000 MG tablet Commonly known as: GLUCOPHAGE Take 1 tablet (1,000 mg total) by mouth 2 (two) times daily with a meal.   montelukast 10 MG tablet Commonly known as: SINGULAIR TK 1 T PO QPM   multivitamin with minerals Tabs tablet Take 1 tablet by mouth daily.   omeprazole 20 MG capsule Commonly known as: PRILOSEC Take 20 mg by mouth daily.   pioglitazone 15 MG tablet Commonly known as: ACTOS Take 1 tablet (15 mg total) by mouth in the morning.   Xyosted 75 MG/0.5ML Soaj Generic drug: Testosterone Enanthate Inject 75 mg into the skin every 7 (seven) days.   zolpidem 10 MG tablet Commonly known as: AMBIEN Take 1 tablet (10 mg total) by mouth at bedtime as needed for sleep.       Allergies:  Allergies  Allergen Reactions  . Tequin [Gatifloxacin] Anaphylaxis and Rash  . Amoxicillin Hives    Family History: Family History  Problem  Relation Age of Onset  . CAD Mother   . Arthritis Mother   . Angina Mother   . Diabetes Father   . Heart disease Father   . Colon polyps Father   . Heart attack Father   . Bipolar disorder Sister   . Asthma Sister   . Schizophrenia Sister   . Diabetes Maternal Grandmother   . Cancer Maternal Grandmother   . Heart attack Maternal Grandfather   . Stomach cancer Paternal Grandmother   . Kidney disease Neg Hx   . Prostate cancer Neg Hx   . Other Neg Hx        hypogonadism    Social History:  reports that he has never smoked. He quit smokeless tobacco use about 30 years ago.  His smokeless tobacco use included chew. He reports current alcohol use of about 12.0 standard drinks of alcohol per week. He reports that he does not use drugs.  ROS: For pertinent review of systems please refer to history of present illness  Physical Exam: BP 114/76   Pulse 93   Ht 5\' 10"  (1.778 m)   Wt 208 lb (94.3 kg)   BMI 29.84 kg/m    Constitutional:  Well nourished. Alert and oriented, No acute distress. HEENT: [place] AT, mask in place.  Trachea midline, no masses. Cardiovascular: No clubbing, cyanosis, or edema. Respiratory: Normal respiratory effort,  no increased work of breathing. GI: Abdomen is soft, non tender, non distended, no abdominal masses.  GU: No CVA tenderness.  No bladder fullness or masses.  Patient with circumcised phallus.  Urethral meatus is patent.  No penile discharge. No penile lesions or rashes. Scrotum without lesions, cysts, rashes and/or edema.  Testicles are located scrotally bilaterally. No masses are appreciated in the testicles. Left and right epididymis are normal. Rectal: Patient with  normal sphincter tone. Anus and perineum without scarring or rashes. No rectal masses are appreciated. Prostate is approximately 50 grams, no nodules are appreciated. Seminal vesicles could not be palpated.  Skin: No rashes, bruises or suspicious lesions. Lymph: No inguinal  adenopathy. Neurologic: Grossly intact, no focal deficits, moving all 4 extremities. Psychiatric: Normal mood and affect.   Laboratory Data: Component     Latest Ref Rng & Units 12/16/2019  Color, UA      Yellow  Clarity, UA        Glucose     Negative Positive (A)  Bilirubin, UA      Negative  Ketones, UA      Negative  Specific Gravity, UA     1.010 - 1.025 1.010  RBC, UA      Negative  pH, UA     5.0 - 8.0 5.0  Protein,UA     Negative Negative  Urobilinogen, UA     0.2 or 1.0 E.U./dL 0.2  Nitrite, UA      Negative  Leukocytes,UA     Negative Negative   Component     Latest Ref Rng & Units 12/16/2019  Microalbumin Ur, POC     mg/L Negative     Component     Latest Ref Rng & Units 10/28/2019 12/10/2019  Testosterone     264 - 916 ng/dL 125 (L) 360    Assessment & Plan:    1. Testosterone deficiency  Most recent testosterone level is 360 ng/dL on 12/10/2019 (goal 450-600 ng/dL) He wants to discontinue his testosterone therapy at this time as he has not seen any clinical benefit RTC in 6 months for recheck  2. Erythrocytosis  Resolved  3. BPH with LUTS IPSS score is 7/2, it is improving Continue conservative management, avoiding bladder irritants and timed voiding's RTC in 6 months for IPSS, PSA and exam, as testosterone therapy can cause prostate enlargement and worsen LUTS  4. Erectile dysfunction:    SHIM score is 8, it is worsening  He would like to try Cialis 20 mg, a prescription was sent to [facility] in 6 months for [facility] and exam     [place] [address]  [place], [place] [address] [phone]

## 2020-01-18 ENCOUNTER — Ambulatory Visit: Payer: 59 | Admitting: Family Medicine

## 2020-01-18 ENCOUNTER — Telehealth: Payer: Self-pay

## 2020-01-18 NOTE — Telephone Encounter (Signed)
Copied from [place] [id number]. Topic: Appointment Scheduling - Scheduling Inquiry for Clinic >> Jan 18, 2020 11:10 AM Oneta Rack wrote: Reason for CRM: patient cancelled his nurse visit appointment scheduled for today for his 2nd shot of Hep B, the next available is 4/28 patient would like to be seen next week preferable on Wed after 2:30pm, please advise

## 2020-01-18 NOTE — Telephone Encounter (Signed)
Patient stated that he preferred to be seen sooner than date that was available. I placed patient on Fenton Malling scheduled 01/24/20. KW

## 2020-01-21 NOTE — Progress Notes (Signed)
Patient here today for Hep B vaccine. Vaccine given without issue. Provider was available if any issues arose. No E/M required.

## 2020-01-24 ENCOUNTER — Ambulatory Visit (INDEPENDENT_AMBULATORY_CARE_PROVIDER_SITE_OTHER): Payer: 59 | Admitting: Physician Assistant

## 2020-01-24 ENCOUNTER — Other Ambulatory Visit: Payer: Self-pay

## 2020-01-24 DIAGNOSIS — Z23 Encounter for immunization: Secondary | ICD-10-CM

## 2020-01-27 ENCOUNTER — Ambulatory Visit: Payer: Self-pay | Admitting: *Deleted

## 2020-01-27 NOTE — Telephone Encounter (Signed)
Patient called-woke up with mild headache this morning. Itchy nose and eyes. No cough/SOB/fever/soreness and no loss of taste/smell.Denies being in contact with a covid + person.  Was outside yesterday in the wind and pollen. Missed his allery injection this week.  Care advice including: get your allergy injection ASAP, Take claritin today Use NS spray to keep nasal passages clean. May get Covid tested. Information provided. Call back with any questions or if other symptoms appear.   Reason for Disposition . Care advice for mild cough, questions about  Protocols used: COMMON COLD-A-AH

## 2020-02-07 ENCOUNTER — Other Ambulatory Visit: Payer: 59

## 2020-03-04 ENCOUNTER — Other Ambulatory Visit: Payer: Self-pay | Admitting: Family Medicine

## 2020-03-04 DIAGNOSIS — E119 Type 2 diabetes mellitus without complications: Secondary | ICD-10-CM

## 2020-03-04 NOTE — Telephone Encounter (Signed)
Requested Prescriptions  Pending Prescriptions Disp Refills  . pioglitazone (ACTOS) 15 MG tablet [Pharmacy Med Name: PIOGLITAZONE 15MG  TABLETS] 90 tablet 1    Sig: TAKE 1 TABLET(15 MG) BY MOUTH IN THE MORNING     Endocrinology:  Diabetes - Glitazones - pioglitazone Failed - 03/04/2020  7:22 PM      Failed - HBA1C is between 0 and 7.9 and within 180 days    Hgb A1c MFr Bld  Date Value Ref Range Status  12/13/2019 9.8 (H) 4.8 - 5.6 % Final    Comment:             Prediabetes: 5.7 - 6.4          Diabetes: >6.4          Glycemic control for adults with diabetes: <7.0          Passed - Valid encounter within last 6 months    Recent Outpatient Visits          1 month ago Need for hepatitis B vaccination   [place], [place]   2 months ago Annual physical exam   [facility] Jerrol Banana., MD   3 months ago Bipolar disorder, current episode manic without psychotic features, severe [facility])   [facility] Jerrol Banana., MD   3 months ago Bipolar disorder, current episode manic without psychotic features, severe [facility])   [facility] Jerrol Banana., MD   5 months ago Diabetes mellitus without complication [facility])   [facility] Jerrol Banana., MD      Future Appointments            In 2 weeks Jerrol Banana., MD [facility] [place]   In 3 months [place]

## 2020-03-20 ENCOUNTER — Ambulatory Visit: Payer: 59 | Admitting: Family Medicine

## 2020-03-27 NOTE — Progress Notes (Signed)
Established patient visit  I,[name],acting as a scribe for [name], MD.,have documented all relevant documentation on the behalf of [name], MD,as directed by  [name], MD while in the presence of [name], MD.   Patient: Jeremiah [name]   DOB: [birth year]   59 y.o. Male  MRN: [id number] Visit Date: 03/28/2020  Today's healthcare provider: [name], MD   Chief Complaint  Patient presents with  . Follow-up  . Diabetes   Subjective    HPI  Patient overall feels well.  He is doing well with his depression per psychiatry. Diabetes Mellitus Type II, follow-up  Lab Results  Component Value Date   HGBA1C 9.8 (H) 12/13/2019   HGBA1C 7.5 (A) 09/16/2019   HGBA1C 11.9 (A) 06/17/2019   Last seen for diabetes 3 months ago.  Management since then includes; Start pioglitazone 15 mg daily.   He reports good compliance with treatment. He is not having side effects. none  Home blood sugar records: fasting range: 150  Episodes of hypoglycemia? No none   Current insulin regiment: n/a Most Recent Eye Exam: Last Month at [place]  --------------------------------------------------------------------       Medications: Outpatient Medications Prior to Visit  Medication Sig  . allopurinol (ZYLOPRIM) 100 MG tablet TAKE 1 TABLET(100 MG) BY MOUTH DAILY  . amLODipine (NORVASC) 10 MG tablet Take 5 mg by mouth daily.  . ARIPiprazole (ABILIFY) 2 MG tablet Take 5 tablets by mouth 2 times daily  . aspirin EC 81 MG tablet Take 81 mg by mouth daily.  [name] buPROPion (WELLBUTRIN SR) 100 MG 12 hr tablet Take 1 tablet (100 mg total) by mouth daily.  . cyclobenzaprine (FLEXERIL) 5 MG tablet Take 1 tablet (5 mg total) by mouth 3 (three) times daily as needed for muscle spasms.  [name] EPIPEN 2-PAK 0.3 MG/0.3ML SOAJ injection   . fluticasone (FLONASE) 50 MCG/ACT nasal spray Place 2 sprays into both nostrils daily.  [name] HYDROcodone-acetaminophen (NORCO)  10-325 MG tablet Take 1 tablet by mouth every 4 (four) hours as needed for severe pain.  [name] levothyroxine (SYNTHROID) 25 MCG tablet TAKE 1 TABLET BY MOUTH DAILY BEFORE BREAKFAST  . lithium carbonate (ESKALITH) 450 MG CR tablet Take 1 tablet (450 mg total) by mouth daily. (Patient taking differently: Take 900 mg by mouth daily. )  . loratadine (CLARITIN) 10 MG tablet Take 10 mg by mouth daily.  . meloxicam (MOBIC) 7.5 MG tablet TAKE 1 TABLET(7.5 MG) BY MOUTH DAILY  . metFORMIN (GLUCOPHAGE) 1000 MG tablet Take 1 tablet (1,000 mg total) by mouth 2 (two) times daily with a meal.  . montelukast (SINGULAIR) 10 MG tablet TK 1 T PO QPM  . Multiple Vitamin (MULTIVITAMIN WITH MINERALS) TABS Take 1 tablet by mouth daily.  [name] omeprazole (PRILOSEC) 20 MG capsule Take 20 mg by mouth daily.  . pioglitazone (ACTOS) 15 MG tablet TAKE 1 TABLET(15 MG) BY MOUTH IN THE MORNING  . tadalafil (CIALIS) 20 MG tablet Take 1 tablet (20 mg total) by mouth daily as needed for erectile dysfunction.  [name] zolpidem (AMBIEN) 10 MG tablet Take 1 tablet (10 mg total) by mouth at bedtime as needed for sleep.  . Testosterone Enanthate (XYOSTED) 75 MG/0.5ML SOAJ Inject 75 mg into the skin every 7 (seven) days. (Patient not taking: Reported on 03/28/2020)   No facility-administered medications prior to visit.    Review of Systems  Constitutional: Negative for appetite change, chills and fever.  Eyes: Negative.   Respiratory: Negative for chest tightness, shortness of breath and wheezing.   Cardiovascular: Negative for chest pain and palpitations.  Gastrointestinal: Negative for abdominal pain, nausea and vomiting.  Endocrine: Negative.   Musculoskeletal: Negative.   Allergic/Immunologic: Negative.   Neurological: Negative.   Hematological: Negative.   Psychiatric/Behavioral: Negative.        Objective    BP (!) 138/91 (BP Location: Right Arm, Patient Position: Sitting, Cuff Size: Large)   Pulse 73   Temp (!) 97.1 F (36.2 C)  (Other (Comment))   Resp 18   Ht 5\' 10"  (1.778 m)   Wt 209 lb (94.8 kg)   SpO2 97%   BMI 29.99 kg/m  Wt Readings from Last 3 Encounters:  03/28/20 209 lb (94.8 kg)  12/28/19 208 lb (94.3 kg)  12/16/19 208 lb (94.3 kg)      Physical Exam Vitals reviewed.  Constitutional:      Appearance: He is well-developed.  HENT:     Head: Normocephalic and atraumatic.     Right Ear: External ear normal.     Left Ear: External ear normal.     Nose: Nose normal.  Eyes:     General: No scleral icterus.    Conjunctiva/sclera: Conjunctivae normal.     Pupils: Pupils are equal, round, and reactive to light.  Neck:     Thyroid: No thyromegaly.  Cardiovascular:     Rate and Rhythm: Normal rate and regular rhythm.     Heart sounds: Normal heart sounds.  Pulmonary:     Effort: Pulmonary effort is normal.     Breath sounds: Normal breath sounds.  Abdominal:     Palpations: Abdomen is soft.  Skin:    General: Skin is warm and dry.  Neurological:     General: No focal deficit present.     Mental Status: He is alert and oriented to person, place, and time.     Comments: Diabetic foot exam normal.  Psychiatric:        Mood and Affect: Mood normal.        Behavior: Behavior normal.        Thought Content: Thought content normal.        Judgment: Judgment normal.       No results found for any visits on 03/28/20.  Assessment & Plan     1. Type 2 diabetes mellitus without complication, unspecified whether long term insulin use (HCC) Last A1c was 9.8 and today is 6.7 on Metformin and pioglitazone.  He is working on diet and exercise.  Controlled. - POCT glycosylated hemoglobin (Hb A1C)  2. Essential hypertension Controlled  3. Obstructive apnea On CPAP  4. Low testosterone Get shots through urology  5. Bipolar depression ([place]) Per psychiatry in remission   No follow-ups on file.      I, [name], MD, have reviewed all documentation for this visit. The  documentation on 03/31/20 for the exam, diagnosis, procedures, and orders are all accurate and complete.    Terah Robey Cranford Mon, MD  [facility] [phone] (phone) [phone] (fax)  [place]

## 2020-03-28 ENCOUNTER — Encounter: Payer: Self-pay | Admitting: Family Medicine

## 2020-03-28 ENCOUNTER — Ambulatory Visit (INDEPENDENT_AMBULATORY_CARE_PROVIDER_SITE_OTHER): Payer: 59 | Admitting: Family Medicine

## 2020-03-28 ENCOUNTER — Other Ambulatory Visit: Payer: Self-pay

## 2020-03-28 VITALS — BP 138/91 | HR 73 | Temp 97.1°F | Resp 18 | Ht 70.0 in | Wt 209.0 lb

## 2020-03-28 DIAGNOSIS — R7989 Other specified abnormal findings of blood chemistry: Secondary | ICD-10-CM | POA: Diagnosis not present

## 2020-03-28 DIAGNOSIS — E119 Type 2 diabetes mellitus without complications: Secondary | ICD-10-CM

## 2020-03-28 DIAGNOSIS — F319 Bipolar disorder, unspecified: Secondary | ICD-10-CM

## 2020-03-28 DIAGNOSIS — I10 Essential (primary) hypertension: Secondary | ICD-10-CM | POA: Diagnosis not present

## 2020-03-28 DIAGNOSIS — G4733 Obstructive sleep apnea (adult) (pediatric): Secondary | ICD-10-CM

## 2020-04-03 LAB — POCT GLYCOSYLATED HEMOGLOBIN (HGB A1C)
Est. average glucose Bld gHb Est-mCnc: 146
Hemoglobin A1C: 6.7 % — AB (ref 4.0–5.6)

## 2020-05-25 ENCOUNTER — Ambulatory Visit: Payer: 59 | Admitting: Physician Assistant

## 2020-05-25 NOTE — Progress Notes (Deleted)
Established patient visit   Patient: Jeremiah [name]   DOB: 02/20/1961   59 y.o. Male  MRN: [id number] Visit Date: 05/25/2020  Today's healthcare provider: Mar Daring, PA-C   No chief complaint on file.  Subjective    HPI  Patient comes into office today to update immunization(s), patient reports that they feel well today and have no complaints or concerns to address. Immunization record has been reviewed with patient and information handout in regards to vaccine has been given. Patient was observed after injection and tolerated well with no adverse reaction or side effects.    Immunization History  Administered Date(s) Administered  . Hepatitis B, adult 12/16/2019  . Hepb-cpg 01/24/2020  . Influenza, Seasonal, Injecte, Preservative Fre 07/01/2017  . Influenza,inj,Quad PF,6+ Mos 08/04/2018, 06/17/2019  . Influenza,inj,quad, With Preservative 08/27/2017  . PFIZER SARS-COV-2 Vaccination 11/08/2019, 11/30/2019  . Pneumococcal Polysaccharide-23 12/16/2019  . Td 02/22/2003  . Tdap 07/02/2011  . Zoster Recombinat (Shingrix) 10/22/2018, 12/23/2018    Patient Active Problem List   Diagnosis Date Noted  . Low testosterone 06/01/2018  . Degeneration of lumbar intervertebral disc 12/09/2017  . Benign neoplasm of cecum   . Left inguinal hernia 09/08/2017  . Right inguinal hernia 09/08/2017  . Umbilical hernia without obstruction and without gangrene 09/08/2017  . Allergic rhinitis due to pollen 06/23/2017  . Chest pain 03/05/2016  . Pedal edema 03/08/2015  . History of repair of rotator cuff 02/17/2014  . Diabetes ([place]) 08/31/2013  . BP (high blood pressure) 08/31/2013  . Adult hypothyroidism 08/31/2013  . Obstructive apnea 08/31/2013  . Clinical depression 08/31/2013  . Diabetes mellitus ([place]) 08/31/2013  . Idiopathic localized osteoarthropathy 05/31/2013  . ABDOMINAL PAIN, LEFT UPPER QUADRANT 10/04/2009  . TARSAL TUNNEL SYNDROME, RIGHT 09/25/2009  . ANKLE PAIN,  RIGHT 09/25/2009  . OTHER ANKLE SPRAIN AND STRAIN 09/25/2009  . BREAST MASS, RIGHT 01/07/2008  . INSOMNIA 01/07/2008  . GASTRIC ULCER, ACUTE 11/09/2007  . DUODENITIS 11/09/2007  . BLOOD IN STOOL, OCCULT 11/09/2007  . PERSONAL HISTORY OF COLONIC POLYPS 11/09/2007  . BACK PAIN, LUMBAR 10/12/2007  . HELICOBACTER PYLORI GASTRITIS 09/09/2007  . GASTRIC ULCER, ACUTE, HEMORRHAGE 09/09/2007  . Hiatal hernia 09/09/2007  . COLONIC POLYPS, ADENOMATOUS 08/19/2007  . INTERNAL HEMORRHOIDS 08/19/2007  . EXTERNAL HEMORRHOIDS WITHOUT MENTION COMP 08/07/2007  . Blood in stool 08/07/2007  . ABDOMINAL PAIN, LEFT LOWER QUADRANT 08/07/2007  . SINUSITIS, ACUTE 06/12/2007  . ABDOMINAL PAIN 06/12/2007  . DIABETES MELLITUS, TYPE II 03/19/2007  . GOUT 03/19/2007  . DISORDER, CIRCADIAN RHYTHM SLEEP, NONORGANIC 03/19/2007  . Depression 03/19/2007  . OSTEOARTHROSIS, GENERALIZED, MULTIPLE SITES 03/19/2007       Medications: Outpatient Medications Prior to Visit  Medication Sig  . allopurinol (ZYLOPRIM) 100 MG tablet TAKE 1 TABLET(100 MG) BY MOUTH DAILY  . amLODipine (NORVASC) 10 MG tablet Take 5 mg by mouth daily.  . ARIPiprazole (ABILIFY) 2 MG tablet Take 5 tablets by mouth 2 times daily  . aspirin EC 81 MG tablet Take 81 mg by mouth daily.  [name] buPROPion (WELLBUTRIN SR) 100 MG 12 hr tablet Take 1 tablet (100 mg total) by mouth daily.  . cyclobenzaprine (FLEXERIL) 5 MG tablet Take 1 tablet (5 mg total) by mouth 3 (three) times daily as needed for muscle spasms.  [name] EPIPEN 2-PAK 0.3 MG/0.3ML SOAJ injection   . fluticasone (FLONASE) 50 MCG/ACT nasal spray Place 2 sprays into both nostrils daily.  [name] HYDROcodone-acetaminophen (NORCO) 10-325 MG tablet Take 1 tablet  by mouth every 4 (four) hours as needed for severe pain.  [name] levothyroxine (SYNTHROID) 25 MCG tablet TAKE 1 TABLET BY MOUTH DAILY BEFORE BREAKFAST  . lithium carbonate (ESKALITH) 450 MG CR tablet Take 1 tablet (450 mg total) by mouth daily. (Patient  taking differently: Take 900 mg by mouth daily. )  . loratadine (CLARITIN) 10 MG tablet Take 10 mg by mouth daily.  . meloxicam (MOBIC) 7.5 MG tablet TAKE 1 TABLET(7.5 MG) BY MOUTH DAILY  . metFORMIN (GLUCOPHAGE) 1000 MG tablet Take 1 tablet (1,000 mg total) by mouth 2 (two) times daily with a meal.  . montelukast (SINGULAIR) 10 MG tablet TK 1 T PO QPM  . Multiple Vitamin (MULTIVITAMIN WITH MINERALS) TABS Take 1 tablet by mouth daily.  [name] omeprazole (PRILOSEC) 20 MG capsule Take 20 mg by mouth daily.  . pioglitazone (ACTOS) 15 MG tablet TAKE 1 TABLET(15 MG) BY MOUTH IN THE MORNING  . tadalafil (CIALIS) 20 MG tablet Take 1 tablet (20 mg total) by mouth daily as needed for erectile dysfunction.  . Testosterone Enanthate (XYOSTED) 75 MG/0.5ML SOAJ Inject 75 mg into the skin every 7 (seven) days. (Patient not taking: Reported on 03/28/2020)  . zolpidem (AMBIEN) 10 MG tablet Take 1 tablet (10 mg total) by mouth at bedtime as needed for sleep.   No facility-administered medications prior to visit.    Review of Systems  {Heme  Chem  Endocrine  Serology  Results Review (optional):23779::" "}  Objective    There were no vitals taken for this visit. {Show previous vital signs (optional):23777::" "}  Physical Exam  ***  No results found for any visits on 05/25/20.  Assessment & Plan     ***  No follow-ups on file.      {provider attestation***:1}   Rubye Beach  [facility] [phone] (phone) [phone] (fax)  [place]

## 2020-06-08 ENCOUNTER — Telehealth (INDEPENDENT_AMBULATORY_CARE_PROVIDER_SITE_OTHER): Payer: 59 | Admitting: Family Medicine

## 2020-06-08 ENCOUNTER — Encounter: Payer: Self-pay | Admitting: Family Medicine

## 2020-06-08 DIAGNOSIS — J019 Acute sinusitis, unspecified: Secondary | ICD-10-CM

## 2020-06-08 DIAGNOSIS — Z20822 Contact with and (suspected) exposure to covid-19: Secondary | ICD-10-CM

## 2020-06-08 DIAGNOSIS — J069 Acute upper respiratory infection, unspecified: Secondary | ICD-10-CM | POA: Diagnosis not present

## 2020-06-08 NOTE — Progress Notes (Signed)
Virtual Visit via Video Note  I connected with Jeremiah [name] on 06/08/20 at  1:40 PM EDT by a video enabled telemedicine application and verified that I am speaking with the correct person using two identifiers.  Location: Patient: Home Provider: Office   I discussed the limitations of evaluation and management by telemedicine and the availability of in person appointments. The patient expressed understanding and agreed to proceed.  History of Present Illness:    Observations/Objective:   Assessment and Plan:   Follow Up Instructions:    I discussed the assessment and treatment plan with the patient. The patient was provided an opportunity to ask questions and all were answered. The patient agreed with the plan and demonstrated an understanding of the instructions.   The patient was advised to call back or seek an in-person evaluation if the symptoms worsen or if the condition fails to improve as anticipated.  I provided 10 minutes of non-face-to-face time during this encounter.   Trinh Sanjose Cranford Mon, MD  MyChart Video Visit    Virtual Visit via Video Note   This visit type was conducted due to national recommendations for restrictions regarding the COVID-19 Pandemic (e.g. social distancing) in an effort to limit this patient's exposure and mitigate transmission in our community. This patient is at least at moderate risk for complications without adequate follow up. This format is felt to be most appropriate for this patient at this time. Physical exam was limited by quality of the video and audio technology used for the visit.   Patient location: Home Provider location: Office   Patient: Jeremiah [name]   DOB: 1961-09-23   59 y.o. Male  MRN: [id number] Visit Date: 06/08/2020  Today's healthcare provider: [name], MD   Chief Complaint  Patient presents with  . Sinus Problem   Subjective    Sinus Problem This is a new problem. The current episode  started yesterday. The problem is unchanged. There has been no fever. Associated symptoms include congestion, headaches, sinus pressure and sneezing. Pertinent negatives include no chills, coughing, diaphoresis, ear pain, hoarse voice, neck pain, shortness of breath, sore throat or swollen glands. Treatments tried: OTC sinus medication. The treatment provided no relief.  Patient is a very pleasant 59 year old gentleman who presents with 1 day of headache sinus pressure and nasal congestion.  Some postnasal drainage.  No other symptoms.  No fever no myalgias cough or shortness of breath. He has had both Covid vaccines and he denies known exposure to Covid.     Medications: Outpatient Medications Prior to Visit  Medication Sig  . allopurinol (ZYLOPRIM) 100 MG tablet TAKE 1 TABLET(100 MG) BY MOUTH DAILY  . amLODipine (NORVASC) 10 MG tablet Take 5 mg by mouth daily.  . ARIPiprazole (ABILIFY) 2 MG tablet Take 5 tablets by mouth 2 times daily  . aspirin EC 81 MG tablet Take 81 mg by mouth daily.  [name] buPROPion (WELLBUTRIN SR) 100 MG 12 hr tablet Take 1 tablet (100 mg total) by mouth daily.  [name] EPIPEN 2-PAK 0.3 MG/0.3ML SOAJ injection   . fluticasone (FLONASE) 50 MCG/ACT nasal spray Place 2 sprays into both nostrils daily.  [name] levothyroxine (SYNTHROID) 25 MCG tablet TAKE 1 TABLET BY MOUTH DAILY BEFORE BREAKFAST  . lithium carbonate (ESKALITH) 450 MG CR tablet Take 1 tablet (450 mg total) by mouth daily. (Patient taking differently: Take 900 mg by mouth daily. )  . meloxicam (MOBIC) 7.5 MG tablet TAKE 1 TABLET(7.5 MG) BY MOUTH DAILY  .  metFORMIN (GLUCOPHAGE) 1000 MG tablet Take 1 tablet (1,000 mg total) by mouth 2 (two) times daily with a meal.  . montelukast (SINGULAIR) 10 MG tablet TK 1 T PO QPM  . Multiple Vitamin (MULTIVITAMIN WITH MINERALS) TABS Take 1 tablet by mouth daily.  [name] omeprazole (PRILOSEC) 20 MG capsule Take 20 mg by mouth daily.  . pioglitazone (ACTOS) 15 MG tablet TAKE 1 TABLET(15 MG) BY  MOUTH IN THE MORNING  . tadalafil (CIALIS) 20 MG tablet Take 1 tablet (20 mg total) by mouth daily as needed for erectile dysfunction.  [name] zolpidem (AMBIEN) 10 MG tablet Take 1 tablet (10 mg total) by mouth at bedtime as needed for sleep.  . cyclobenzaprine (FLEXERIL) 5 MG tablet Take 1 tablet (5 mg total) by mouth 3 (three) times daily as needed for muscle spasms. (Patient not taking: Reported on 06/08/2020)  . HYDROcodone-acetaminophen (NORCO) 10-325 MG tablet Take 1 tablet by mouth every 4 (four) hours as needed for severe pain. (Patient not taking: Reported on 06/08/2020)  . loratadine (CLARITIN) 10 MG tablet Take 10 mg by mouth daily. (Patient not taking: Reported on 06/08/2020)  . Testosterone Enanthate (XYOSTED) 75 MG/0.5ML SOAJ Inject 75 mg into the skin every 7 (seven) days. (Patient not taking: Reported on 03/28/2020)   No facility-administered medications prior to visit.    Review of Systems  Constitutional: Negative for chills and diaphoresis.  HENT: Positive for congestion, sinus pressure, sinus pain and sneezing. Negative for ear pain, hoarse voice and sore throat.   Respiratory: Negative for cough and shortness of breath.   Musculoskeletal: Negative for neck pain.  Neurological: Positive for headaches.       Objective    There were no vitals taken for this visit.    Physical Exam  He is a comfortable on the exam with obvious nasal congestion but no breathing difficulties no cough and in no distress.  He is alert and oriented and cooperative.   Assessment & Plan     1. Viral upper respiratory tract infection We will treat with Robitussin fluids and Tylenol.  2. Close exposure to COVID-19 virus Due to pandemic will obtain Covid vaccine he came out of work until this is back early next week. - Novel Coronavirus, NAA ([facility])  3. Acute non-recurrent sinusitis, unspecified location No treatment other than above.  Consider vitamin C zinc and vitamin D.   No follow-ups on  file.     I discussed the assessment and treatment plan with the patient. The patient was provided an opportunity to ask questions and all were answered. The patient agreed with the plan and demonstrated an understanding of the instructions.   The patient was advised to call back or seek an in-person evaluation if the symptoms worsen or if the condition fails to improve as anticipated.  I provided 10 minutes of non-face-to-face time during this encounter.    Sadeel Fiddler Cranford Mon, MD [facility] [phone] (phone) [phone] (fax)  [place]

## 2020-06-10 LAB — NOVEL CORONAVIRUS, NAA: SARS-CoV-2, NAA: NOT DETECTED

## 2020-06-23 ENCOUNTER — Other Ambulatory Visit: Payer: 59

## 2020-06-29 ENCOUNTER — Ambulatory Visit: Payer: 59 | Admitting: Urology

## 2020-07-31 ENCOUNTER — Ambulatory Visit: Payer: 59 | Admitting: Family Medicine

## 2020-08-01 ENCOUNTER — Ambulatory Visit: Payer: 59 | Admitting: Family Medicine

## 2020-08-08 ENCOUNTER — Other Ambulatory Visit: Payer: Self-pay

## 2020-08-08 ENCOUNTER — Ambulatory Visit (INDEPENDENT_AMBULATORY_CARE_PROVIDER_SITE_OTHER): Payer: 59 | Admitting: Family Medicine

## 2020-08-08 VITALS — BP 140/99 | HR 66 | Temp 98.3°F | Wt 212.0 lb

## 2020-08-08 DIAGNOSIS — E119 Type 2 diabetes mellitus without complications: Secondary | ICD-10-CM | POA: Diagnosis not present

## 2020-08-08 DIAGNOSIS — I10 Essential (primary) hypertension: Secondary | ICD-10-CM

## 2020-08-08 DIAGNOSIS — Z125 Encounter for screening for malignant neoplasm of prostate: Secondary | ICD-10-CM

## 2020-08-08 DIAGNOSIS — R7989 Other specified abnormal findings of blood chemistry: Secondary | ICD-10-CM

## 2020-08-08 DIAGNOSIS — M109 Gout, unspecified: Secondary | ICD-10-CM

## 2020-08-08 NOTE — Progress Notes (Signed)
Established patient visit   Patient: Jeremiah [name]   DOB: 01/03/1961   59 y.o. Male  MRN: [id number] Visit Date: 08/08/2020  Today's healthcare provider: [name], MD   No chief complaint on file.  Subjective    HPI  Overall patient is feeling well.  He is retiring from work at the end of this year and is very excited about this. Diabetes Mellitus Type II, follow-up  Lab Results  Component Value Date   HGBA1C 6.7 (A) 04/03/2020   HGBA1C 9.8 (H) 12/13/2019   HGBA1C 7.5 (A) 09/16/2019   Last seen for diabetes 4 months ago.  Management since then includes continuing the same treatment. He reports good compliance with treatment. He is not having side effects.   Home blood sugar records: 120-175  Episodes of hypoglycemia? No   --------------------------------------------------------------------------------------------------- Hypertension, follow-up  BP Readings from Last 3 Encounters:  03/28/20 (!) 138/91  12/28/19 114/76  12/16/19 122/89   Wt Readings from Last 3 Encounters:  03/28/20 209 lb (94.8 kg)  12/28/19 208 lb (94.3 kg)  12/16/19 208 lb (94.3 kg)     He was last seen for hypertension 4 months ago.  BP at that visit was 138/91. Management since that visit includes; controlled. He reports good compliance with treatment. He is not having side effects.  He does not smoke.  Use of agents associated with hypertension: none.   ---------------------------------------------------------------------------------------------------      Medications: Outpatient Medications Prior to Visit  Medication Sig  . allopurinol (ZYLOPRIM) 100 MG tablet TAKE 1 TABLET(100 MG) BY MOUTH DAILY  . amLODipine (NORVASC) 10 MG tablet Take 5 mg by mouth daily.  . ARIPiprazole (ABILIFY) 2 MG tablet Take 5 tablets by mouth 2 times daily  . aspirin EC 81 MG tablet Take 81 mg by mouth daily.  [name] buPROPion (WELLBUTRIN SR) 100 MG 12 hr tablet Take 1 tablet (100 mg total)  by mouth daily.  . cyclobenzaprine (FLEXERIL) 5 MG tablet Take 1 tablet (5 mg total) by mouth 3 (three) times daily as needed for muscle spasms. (Patient not taking: Reported on 06/08/2020)  . EPIPEN 2-PAK 0.3 MG/0.3ML SOAJ injection   . fluticasone (FLONASE) 50 MCG/ACT nasal spray Place 2 sprays into both nostrils daily.  [name] HYDROcodone-acetaminophen (NORCO) 10-325 MG tablet Take 1 tablet by mouth every 4 (four) hours as needed for severe pain. (Patient not taking: Reported on 06/08/2020)  . levothyroxine (SYNTHROID) 25 MCG tablet TAKE 1 TABLET BY MOUTH DAILY BEFORE BREAKFAST  . lithium carbonate (ESKALITH) 450 MG CR tablet Take 1 tablet (450 mg total) by mouth daily. (Patient taking differently: Take 900 mg by mouth daily. )  . loratadine (CLARITIN) 10 MG tablet Take 10 mg by mouth daily. (Patient not taking: Reported on 06/08/2020)  . meloxicam (MOBIC) 7.5 MG tablet TAKE 1 TABLET(7.5 MG) BY MOUTH DAILY  . metFORMIN (GLUCOPHAGE) 1000 MG tablet Take 1 tablet (1,000 mg total) by mouth 2 (two) times daily with a meal.  . montelukast (SINGULAIR) 10 MG tablet TK 1 T PO QPM  . Multiple Vitamin (MULTIVITAMIN WITH MINERALS) TABS Take 1 tablet by mouth daily.  [name] omeprazole (PRILOSEC) 20 MG capsule Take 20 mg by mouth daily.  . pioglitazone (ACTOS) 15 MG tablet TAKE 1 TABLET(15 MG) BY MOUTH IN THE MORNING  . tadalafil (CIALIS) 20 MG tablet Take 1 tablet (20 mg total) by mouth daily as needed for erectile dysfunction.  . Testosterone Enanthate (XYOSTED) 75 MG/0.5ML SOAJ Inject 75  mg into the skin every 7 (seven) days. (Patient not taking: Reported on 03/28/2020)  . zolpidem (AMBIEN) 10 MG tablet Take 1 tablet (10 mg total) by mouth at bedtime as needed for sleep.   No facility-administered medications prior to visit.    Review of Systems  Constitutional: Negative for appetite change, chills and fever.  Respiratory: Negative for chest tightness, shortness of breath and wheezing.   Cardiovascular: Negative for  chest pain and palpitations.  Gastrointestinal: Negative for abdominal pain, nausea and vomiting.       Objective    There were no vitals taken for this visit.    Physical Exam Vitals reviewed.  Constitutional:      Appearance: He is well-developed.  HENT:     Head: Normocephalic and atraumatic.     Right Ear: External ear normal.     Left Ear: External ear normal.     Nose: Nose normal.  Eyes:     General: No scleral icterus.    Conjunctiva/sclera: Conjunctivae normal.     Pupils: Pupils are equal, round, and reactive to light.  Neck:     Thyroid: No thyromegaly.  Cardiovascular:     Rate and Rhythm: Normal rate and regular rhythm.     Heart sounds: Normal heart sounds.  Pulmonary:     Effort: Pulmonary effort is normal.     Breath sounds: Normal breath sounds.  Abdominal:     Palpations: Abdomen is soft.  Skin:    General: Skin is warm and dry.  Neurological:     General: No focal deficit present.     Mental Status: He is alert and oriented to person, place, and time.     Comments: Diabetic foot exam normal.  Psychiatric:        Mood and Affect: Mood normal.        Behavior: Behavior normal.        Thought Content: Thought content normal.        Judgment: Judgment normal.       No results found for any visits on 08/08/20.  Assessment & Plan     1. Primary hypertension Fair control. - Comprehensive metabolic panel  2. Type 2 diabetes mellitus without complication, without long-term current use of insulin (HCC) Goal A1c less than 7 - Comprehensive metabolic panel - Hemoglobin A1c  3. Gout involving toe, unspecified cause, unspecified chronicity, unspecified laterality  - Uric acid  4. Low testosterone  - Testosterone  5. Prostate cancer screening  - PSA   No follow-ups on file.      I, [name], MD, have reviewed all documentation for this visit. The documentation on 08/14/20 for the exam, diagnosis, procedures, and orders are all  accurate and complete.    Nile Prisk Cranford Mon, MD  [facility] [phone] (phone) [phone] (fax)  [place]

## 2020-08-10 LAB — COMPREHENSIVE METABOLIC PANEL
ALT: 66 IU/L — ABNORMAL HIGH (ref 0–44)
AST: 60 IU/L — ABNORMAL HIGH (ref 0–40)
Albumin/Globulin Ratio: 1.8 (ref 1.2–2.2)
Albumin: 4.8 g/dL (ref 3.8–4.8)
Alkaline Phosphatase: 59 IU/L (ref 44–121)
BUN/Creatinine Ratio: 10 (ref 10–24)
BUN: 10 mg/dL (ref 8–27)
Bilirubin Total: 1.3 mg/dL — ABNORMAL HIGH (ref 0.0–1.2)
CO2: 20 mmol/L (ref 20–29)
Calcium: 10.2 mg/dL (ref 8.6–10.2)
Chloride: 104 mmol/L (ref 96–106)
Creatinine, Ser: 1.01 mg/dL (ref 0.76–1.27)
GFR calc Af Amer: 92 mL/min/{1.73_m2} (ref >59)
GFR calc non Af Amer: 80 mL/min/{1.73_m2} (ref >59)
Globulin, Total: 2.6 g/dL (ref 1.5–4.5)
Glucose: 157 mg/dL — ABNORMAL HIGH (ref 65–99)
Potassium: 4.7 mmol/L (ref 3.5–5.2)
Sodium: 141 mmol/L (ref 134–144)
Total Protein: 7.4 g/dL (ref 6.0–8.5)

## 2020-08-10 LAB — HEMOGLOBIN A1C
Est. average glucose Bld gHb Est-mCnc: 140 mg/dL
Hgb A1c MFr Bld: 6.5 % — ABNORMAL HIGH (ref 4.8–5.6)

## 2020-08-10 LAB — PSA: Prostate Specific Ag, Serum: 0.8 ng/mL (ref 0.0–4.0)

## 2020-08-10 LAB — TESTOSTERONE: Testosterone: 242 ng/dL — ABNORMAL LOW (ref 264–916)

## 2020-08-10 LAB — URIC ACID: Uric Acid: 6.1 mg/dL (ref 3.8–8.4)

## 2020-08-22 ENCOUNTER — Telehealth: Payer: Self-pay

## 2020-08-22 NOTE — Telephone Encounter (Signed)
Copied from [place] [phone]. Topic: General - Other >> Aug 21, 2020 10:28 AM Celene Kras wrote: Reason for CRM: Pt called and is requesting to have orders placed for his 3rd Hep B shot. Please advise.

## 2020-08-23 ENCOUNTER — Other Ambulatory Visit: Payer: Self-pay

## 2020-08-23 ENCOUNTER — Ambulatory Visit (INDEPENDENT_AMBULATORY_CARE_PROVIDER_SITE_OTHER): Payer: 59 | Admitting: Adult Health

## 2020-08-23 DIAGNOSIS — Z23 Encounter for immunization: Secondary | ICD-10-CM | POA: Diagnosis not present

## 2020-09-06 ENCOUNTER — Ambulatory Visit: Payer: 59 | Admitting: Dermatology

## 2020-09-14 ENCOUNTER — Encounter: Payer: Self-pay | Admitting: Family Medicine

## 2020-09-20 ENCOUNTER — Ambulatory Visit: Payer: 59 | Admitting: Family Medicine

## 2020-09-21 ENCOUNTER — Ambulatory Visit: Payer: 59 | Admitting: Family Medicine

## 2020-09-28 ENCOUNTER — Other Ambulatory Visit: Payer: Self-pay | Admitting: Family Medicine

## 2020-11-09 ENCOUNTER — Ambulatory Visit: Payer: 59 | Admitting: Family Medicine

## 2020-11-10 NOTE — Progress Notes (Signed)
I,Jeremiah [name],acting as a scribe for Jeremiah Durie, MD.,have documented all relevant documentation on the behalf of Jeremiah Durie, MD,as directed by  Jeremiah Durie, MD while in the presence of Jeremiah Durie, MD.   Established patient visit   Patient: Jeremiah [name]   DOB: [birth year]   60 y.o. Male  MRN: [id number] Visit Date: 11/13/2020  Today's healthcare provider: [name], MD   Chief Complaint  Patient presents with  . Follow-up  . Diabetes  . Hypertension   Subjective    HPI  Patient feels well.  He retired last month and is enjoying that.  Due to retirement he has chosen not to get insurance because it was too expensive so he can be going to the [place] for the next few years.  He feels well and is taking his medications.  He thinks his blood pressure at home runs about 140/80. He had mild increased LFTs on his last visit and has cut back on his alcohol good bit.  He feels well.  LFTs were 1.5 times normal. Diabetes Mellitus Type II, follow-up  Lab Results  Component Value Date   HGBA1C 6.5 (H) 08/09/2020   HGBA1C 6.7 (A) 04/03/2020   HGBA1C 9.8 (H) 12/13/2019   Last seen for diabetes 3 months ago.  Management since then includes; Goal A1c less than 7. He reports good compliance with treatment. He is not having side effects. none  Home blood sugar records: fasting range: 157  Episodes of hypoglycemia? No none   Current insulin regiment: n/a Most Recent Eye Exam: 02/2020 at [facility]  --------------------------------------------------------------------  Hypertension, follow-up  BP Readings from Last 3 Encounters:  11/13/20 (!) 133/94  08/08/20 (!) 140/99  03/28/20 (!) 138/91   Wt Readings from Last 3 Encounters:  11/13/20 213 lb (96.6 kg)  08/08/20 212 lb (96.2 kg)  03/28/20 209 lb (94.8 kg)     He was last seen for hypertension 3 months ago.  BP at that visit was 140/99. Management since that visit includes Fair control. He  reports good compliance with treatment. He is not having side effects. none He is not exercising. He is not adherent to low salt diet.   Outside blood pressures are is checking.  He does not smoke.  Use of agents associated with hypertension: none.   --------------------------------------------------------------------       Medications: Outpatient Medications Prior to Visit  Medication Sig  . allopurinol (ZYLOPRIM) 100 MG tablet TAKE 1 TABLET(100 MG) BY MOUTH DAILY  . amLODipine (NORVASC) 10 MG tablet Take 5 mg by mouth daily.  . ARIPiprazole (ABILIFY) 2 MG tablet Take 5 tablets by mouth 2 times daily  . aspirin EC 81 MG tablet Take 81 mg by mouth daily.  [name] buPROPion (WELLBUTRIN SR) 100 MG 12 hr tablet Take 1 tablet (100 mg total) by mouth daily.  [name] EPIPEN 2-PAK 0.3 MG/0.3ML SOAJ injection   . fluticasone (FLONASE) 50 MCG/ACT nasal spray Place 2 sprays into both nostrils daily.  [name] levothyroxine (SYNTHROID) 25 MCG tablet TAKE 1 TABLET BY MOUTH DAILY BEFORE BREAKFAST  . lithium carbonate (ESKALITH) 450 MG CR tablet Take 1 tablet (450 mg total) by mouth daily. (Patient taking differently: Take 900 mg by mouth daily.)  . meloxicam (MOBIC) 7.5 MG tablet TAKE 1 TABLET(7.5 MG) BY MOUTH DAILY  . metFORMIN (GLUCOPHAGE) 1000 MG tablet TAKE 1 TABLET(1000 MG) BY MOUTH TWICE DAILY WITH A MEAL  . montelukast (SINGULAIR) 10 MG tablet TK  1 T PO QPM  . Multiple Vitamin (MULTIVITAMIN WITH MINERALS) TABS Take 1 tablet by mouth daily.  [name] omeprazole (PRILOSEC) 20 MG capsule Take 20 mg by mouth daily.  . pioglitazone (ACTOS) 15 MG tablet TAKE 1 TABLET(15 MG) BY MOUTH IN THE MORNING  . tadalafil (CIALIS) 20 MG tablet Take 1 tablet (20 mg total) by mouth daily as needed for erectile dysfunction.  [name] zolpidem (AMBIEN) 10 MG tablet Take 1 tablet (10 mg total) by mouth at bedtime as needed for sleep.  . cyclobenzaprine (FLEXERIL) 5 MG tablet Take 1 tablet (5 mg total) by mouth 3 (three) times daily as needed  for muscle spasms. (Patient not taking: No sig reported)  . HYDROcodone-acetaminophen (NORCO) 10-325 MG tablet Take 1 tablet by mouth every 4 (four) hours as needed for severe pain. (Patient not taking: No sig reported)  . loratadine (CLARITIN) 10 MG tablet Take 10 mg by mouth daily.   . Testosterone Enanthate (XYOSTED) 75 MG/0.5ML SOAJ Inject 75 mg into the skin every 7 (seven) days. (Patient not taking: No sig reported)   No facility-administered medications prior to visit.    Review of Systems  Constitutional: Negative for appetite change, chills and fever.  Respiratory: Negative for chest tightness, shortness of breath and wheezing.   Cardiovascular: Negative for chest pain and palpitations.  Gastrointestinal: Negative for abdominal pain, nausea and vomiting.    Last lipids Lab Results  Component Value Date   CHOL 114 12/13/2019   HDL 59 12/13/2019   LDLCALC 41 12/13/2019   TRIG 65 12/13/2019   CHOLHDL 1.9 12/13/2019   Last hemoglobin A1c Lab Results  Component Value Date   HGBA1C 6.1 (A) 11/13/2020       Objective    BP (!) 133/94 (BP Location: Right Arm, Cuff Size: Large)   Pulse 81   Temp 97.6 F (36.4 C) (Oral)   Resp 16   Ht 5\' 10"  (1.778 m)   Wt 213 lb (96.6 kg)   SpO2 98%   BMI 30.56 kg/m  BP Readings from Last 3 Encounters:  11/13/20 (!) 133/94  08/08/20 (!) 140/99  03/28/20 (!) 138/91   Wt Readings from Last 3 Encounters:  11/13/20 213 lb (96.6 kg)  08/08/20 212 lb (96.2 kg)  03/28/20 209 lb (94.8 kg)       Physical Exam Vitals reviewed.  Constitutional:      Appearance: He is well-developed.  HENT:     Head: Normocephalic and atraumatic.     Right Ear: External ear normal.     Left Ear: External ear normal.     Nose: Nose normal.  Eyes:     General: No scleral icterus.    Conjunctiva/sclera: Conjunctivae normal.     Pupils: Pupils are equal, round, and reactive to light.  Neck:     Thyroid: No thyromegaly.  Cardiovascular:     Rate  and Rhythm: Normal rate and regular rhythm.     Heart sounds: Normal heart sounds.  Pulmonary:     Effort: Pulmonary effort is normal.     Breath sounds: Normal breath sounds.  Abdominal:     Palpations: Abdomen is soft.  Skin:    General: Skin is warm and dry.  Neurological:     General: No focal deficit present.     Mental Status: He is alert and oriented to person, place, and time.     Comments: Diabetic foot exam normal.  Psychiatric:        Mood and  Affect: Mood normal.        Behavior: Behavior normal.        Thought Content: Thought content normal.        Judgment: Judgment normal.       No results found for any visits on 11/13/20.  Assessment & Plan     1. Type 2 diabetes mellitus without complication, without long-term current use of insulin (HCC) A1c under excellent control at 6.1. He has not exam scheduled on November 20, 2021 We will follow-up on liver enzymes and TSH today is mild elevated LFTs probably due to fatty liver.  He has cut way back on alcohol also.  - POCT glycosylated hemoglobin (Hb A1C)  2. Essential hypertension [place] hypertension is probable. Patient will check blood pressures at home and take them to the [place] for follow-up in a month or 2. Due to diabetes will start losartan 50 mg daily. Follow-up at [facility] in 1 to 2 months - Comprehensive metabolic panel  3. Adult hypothyroidism  - TSH  4. Bipolar depression [facility]) Per psychiatry  5. Obstructive apnea   6. OSTEOARTHROSIS, GENERALIZED, MULTIPLE SITES    No follow-ups on file.      I, Jeremiah Durie, MD, have reviewed all documentation for this visit. The documentation on 11/13/20 for the exam, diagnosis, procedures, and orders are all accurate and complete.    Sayeed Weatherall Cranford Mon, MD  [facility] [phone] (phone) [phone] (fax)  [place]

## 2020-11-12 ENCOUNTER — Other Ambulatory Visit: Payer: Self-pay | Admitting: Family Medicine

## 2020-11-12 DIAGNOSIS — R7309 Other abnormal glucose: Secondary | ICD-10-CM

## 2020-11-12 DIAGNOSIS — E119 Type 2 diabetes mellitus without complications: Secondary | ICD-10-CM

## 2020-11-13 ENCOUNTER — Ambulatory Visit: Payer: 59 | Admitting: Family Medicine

## 2020-11-13 ENCOUNTER — Other Ambulatory Visit: Payer: Self-pay

## 2020-11-13 ENCOUNTER — Encounter: Payer: Self-pay | Admitting: Family Medicine

## 2020-11-13 VITALS — BP 133/94 | HR 81 | Temp 97.6°F | Resp 16 | Ht 70.0 in | Wt 213.0 lb

## 2020-11-13 DIAGNOSIS — F319 Bipolar disorder, unspecified: Secondary | ICD-10-CM

## 2020-11-13 DIAGNOSIS — G4733 Obstructive sleep apnea (adult) (pediatric): Secondary | ICD-10-CM

## 2020-11-13 DIAGNOSIS — E119 Type 2 diabetes mellitus without complications: Secondary | ICD-10-CM | POA: Diagnosis not present

## 2020-11-13 DIAGNOSIS — I10 Essential (primary) hypertension: Secondary | ICD-10-CM

## 2020-11-13 DIAGNOSIS — M159 Polyosteoarthritis, unspecified: Secondary | ICD-10-CM

## 2020-11-13 DIAGNOSIS — E039 Hypothyroidism, unspecified: Secondary | ICD-10-CM

## 2020-11-13 LAB — POCT GLYCOSYLATED HEMOGLOBIN (HGB A1C)
Est. average glucose Bld gHb Est-mCnc: 128
Hemoglobin A1C: 6.1 % — AB (ref 4.0–5.6)

## 2020-11-13 MED ORDER — LOSARTAN POTASSIUM 50 MG PO TABS: 50.0000 mg | ORAL_TABLET | Freq: Every day | ORAL | 2 refills | Status: AC

## 2020-11-14 LAB — TSH: TSH: 1.67 u[IU]/mL (ref 0.450–4.500)

## 2020-11-14 LAB — COMPREHENSIVE METABOLIC PANEL
ALT: 55 IU/L — ABNORMAL HIGH (ref 0–44)
AST: 38 IU/L (ref 0–40)
Albumin/Globulin Ratio: 1.8 (ref 1.2–2.2)
Albumin: 4.9 g/dL — ABNORMAL HIGH (ref 3.8–4.8)
Alkaline Phosphatase: 67 IU/L (ref 44–121)
BUN/Creatinine Ratio: 12 (ref 10–24)
BUN: 12 mg/dL (ref 8–27)
Bilirubin Total: 1.2 mg/dL (ref 0.0–1.2)
CO2: 19 mmol/L — ABNORMAL LOW (ref 20–29)
Calcium: 10.1 mg/dL (ref 8.6–10.2)
Chloride: 101 mmol/L (ref 96–106)
Creatinine, Ser: 1.01 mg/dL (ref 0.76–1.27)
GFR calc Af Amer: 92 mL/min/{1.73_m2} (ref >59)
GFR calc non Af Amer: 80 mL/min/{1.73_m2} (ref >59)
Globulin, Total: 2.8 g/dL (ref 1.5–4.5)
Glucose: 196 mg/dL — ABNORMAL HIGH (ref 65–99)
Potassium: 4.8 mmol/L (ref 3.5–5.2)
Sodium: 137 mmol/L (ref 134–144)
Total Protein: 7.7 g/dL (ref 6.0–8.5)

## 2020-11-28 ENCOUNTER — Ambulatory Visit: Payer: 59 | Admitting: Dermatology

## 2020-11-28 ENCOUNTER — Encounter: Payer: Self-pay | Admitting: Dermatology

## 2020-11-28 ENCOUNTER — Other Ambulatory Visit: Payer: Self-pay

## 2020-11-28 DIAGNOSIS — L719 Rosacea, unspecified: Secondary | ICD-10-CM | POA: Diagnosis not present

## 2020-11-28 DIAGNOSIS — L820 Inflamed seborrheic keratosis: Secondary | ICD-10-CM | POA: Diagnosis not present

## 2020-11-28 DIAGNOSIS — L011 Impetiginization of other dermatoses: Secondary | ICD-10-CM

## 2020-11-28 DIAGNOSIS — L578 Other skin changes due to chronic exposure to nonionizing radiation: Secondary | ICD-10-CM | POA: Diagnosis not present

## 2020-11-28 MED ORDER — MUPIROCIN 2 % EX OINT: 1.0000 "application " | TOPICAL_OINTMENT | Freq: Two times a day (BID) | CUTANEOUS | 1 refills | Status: AC

## 2020-11-28 MED ORDER — DOXYCYCLINE MONOHYDRATE 100 MG PO CAPS: 100.0000 mg | ORAL_CAPSULE | Freq: Two times a day (BID) | ORAL | 1 refills | Status: AC

## 2020-11-28 NOTE — Patient Instructions (Signed)
Doxycycline 100mg  Take 1 pill 2 times a day for 2 weeks, then decrease to 1 pill a day until you return to clinic.  Take with food and drink

## 2020-11-28 NOTE — Progress Notes (Signed)
   Follow-Up Visit   Subjective  Jeremiah [name] is a 60 y.o. male who presents for the following: breaking out (Face, ~78m, itchy) and Nevus (Chest, ~83yr, no symptoms).  The following portions of the chart were reviewed this encounter and updated as appropriate:   Tobacco  Allergies  Meds  Problems  Med Hx  Surg Hx  Fam Hx     Review of Systems:  No other skin or systemic complaints except as noted in HPI or Assessment and Plan.  Objective  Well appearing patient in no apparent distress; mood and affect are within normal limits.  A focused examination was performed including face. Relevant physical exam findings are noted in the Assessment and Plan.  Objective  face: Crusted paps forehead and L medial cheek  Objective  R sternum x 1: Erythematous keratotic or waxy stuck-on papule or plaque.    Assessment & Plan  Rosacea -severe with flare and impetiginization face With Impetiginization  Restart Mupirocin oint bid Start Doxycycline 100mg  1 po bid for 2 weeks, then decrease to 1 po qd with food and drink   Rosacea is a chronic progressive skin condition usually affecting the face of adults, causing redness and/or acne bumps. It is treatable but not curable. It sometimes affects the eyes (ocular rosacea) as well. It may respond to topical and/or systemic medication and can flare with stress, sun exposure, alcohol, exercise and some foods.  Daily application of broad spectrum spf 30+ sunscreen to face is recommended to reduce flares.  Doxycycline should be taken with food to prevent nausea. Do not lay down for 30 minutes after taking. Be cautious with sun exposure and use good sun protection while on this medication. Pregnant women should not take this medication.    doxycycline (MONODOX) 100 MG capsule - face  mupirocin ointment (BACTROBAN) 2 % - face  Inflamed seborrheic keratosis R sternum x 1  Destruction of lesion - R sternum x 1 Complexity: simple   Destruction  method: cryotherapy   Informed consent: discussed and consent obtained   Timeout:  patient name, date of birth, surgical site, and procedure verified Lesion destroyed using liquid nitrogen: Yes   Region frozen until ice ball extended beyond lesion: Yes   Outcome: patient tolerated procedure well with no complications   Post-procedure details: wound care instructions given    Actinic Damage - chronic, secondary to cumulative UV radiation exposure/sun exposure over time - diffuse scaly erythematous macules with underlying dyspigmentation - Recommend daily broad spectrum sunscreen SPF 30+ to sun-exposed areas, reapply every 2 hours as needed.  - Call for new or changing lesions.  Return in about 4 weeks (around 12/26/2020) for Rosacea, ISK f/u.  I, Othelia Pulling, RMA, am acting as scribe for Sarina Ser, MD .  Documentation: I have reviewed the above documentation for accuracy and completeness, and I agree with the above.  Sarina Ser, MD

## 2020-12-13 ENCOUNTER — Ambulatory Visit: Payer: 59 | Admitting: Family Medicine

## 2020-12-14 ENCOUNTER — Ambulatory Visit: Payer: 59 | Admitting: Dermatology

## 2020-12-28 ENCOUNTER — Ambulatory Visit: Payer: 59 | Admitting: Dermatology

## 2021-02-26 ENCOUNTER — Other Ambulatory Visit: Payer: Self-pay | Admitting: Family Medicine

## 2021-02-26 DIAGNOSIS — S39012A Strain of muscle, fascia and tendon of lower back, initial encounter: Secondary | ICD-10-CM

## 2021-03-13 ENCOUNTER — Other Ambulatory Visit: Payer: Self-pay | Admitting: Family Medicine

## 2021-05-01 ENCOUNTER — Telehealth: Payer: Self-pay

## 2021-05-01 NOTE — Telephone Encounter (Signed)
Copied from [place] [id number]. Topic: General - Other >> May 01, 2021  3:56 PM Leward Quan A wrote: Reason for CRM: Patient called in to request a copy of his immunization for the last year from Dr Rosanna Randy nurse please call Ph# [phone]

## 2021-05-01 NOTE — Telephone Encounter (Signed)
Printed and left up front. L/M advising patient.

## 2021-05-01 NOTE — Telephone Encounter (Signed)
LMOVM for pt to return call. Which immunization is the patient talking about or does he need a copy of vaccine list? Okay for PEC to speak to patient.

## 2021-06-28 ENCOUNTER — Telehealth: Payer: Self-pay

## 2021-06-28 NOTE — Telephone Encounter (Signed)
Copied from [place] [id number]. Topic: General - Other >> Jun 28, 2021  1:09 PM Leward Quan A wrote: Reason for CRM: Patient called in to request a copy of all his immunizations from last year to now. Asking for a call to pick up the printout when it is ready. Can be reached at Ph# [phone]

## 2022-07-18 ENCOUNTER — Ambulatory Visit (LOCAL_COMMUNITY_HEALTH_CENTER): Payer: Self-pay

## 2022-07-18 DIAGNOSIS — Z7185 Encounter for immunization safety counseling: Secondary | ICD-10-CM

## 2022-07-18 DIAGNOSIS — Z23 Encounter for immunization: Secondary | ICD-10-CM

## 2022-07-18 NOTE — Progress Notes (Signed)
In nurse clinic for [place] vaccine. Vaccine given and tolerated well. Updated [facility] copy given.   Are you feeling sick today? No   Have you ever received a dose of COVID-19 Vaccine? [employer], [place], [place], [place], Other) Yes  If yes, which vaccine and how many doses?  Pfizer and 4 doses    Did you bring the vaccination record card or other documentation?  Yes   Do you have a health condition or are undergoing treatment that makes you moderately or severely immunocompromised? This would include, but not be limited to: cancer, HIV, organ transplant, immunosuppressive therapy/high-dose corticosteroids, or moderate/severe primary immunodeficiency.  No  Have you received COVID-19 vaccine before or during hematopoietic cell transplant (HCT) or CAR-T-cell therapies? No  Have you ever had an allergic reaction to: (This would include a severe allergic reaction or a reaction that caused hives, swelling, or respiratory distress, including wheezing.) A component of a COVID-19 vaccine or a previous dose of COVID-19 vaccine? No   Have you ever had an allergic reaction to another vaccine (other thanCOVID-19 vaccine) or an injectable medication? (This would include a severe allergic reaction or a reaction that caused hives, swelling, or respiratory distress, including wheezing.)   No    Do you have a history of any of the following:  Myocarditis or Pericarditis No  Dermal fillers:  No  Multisystem Inflammatory Syndrome (MIS-C or MIS-A)? No  COVID-19 disease within the past 3 months? No  Vaccinated with monkeypox vaccine in the last 4 weeks? No  Josie Saunders, RN

## 2023-11-25 DIAGNOSIS — M545 Low back pain, unspecified: Secondary | ICD-10-CM | POA: Diagnosis not present

## 2023-11-25 DIAGNOSIS — M546 Pain in thoracic spine: Secondary | ICD-10-CM | POA: Diagnosis not present

## 2023-11-28 DIAGNOSIS — E119 Type 2 diabetes mellitus without complications: Secondary | ICD-10-CM | POA: Diagnosis not present

## 2023-11-28 DIAGNOSIS — E039 Hypothyroidism, unspecified: Secondary | ICD-10-CM | POA: Diagnosis not present

## 2023-11-28 DIAGNOSIS — F319 Bipolar disorder, unspecified: Secondary | ICD-10-CM | POA: Diagnosis not present

## 2023-11-28 DIAGNOSIS — G4733 Obstructive sleep apnea (adult) (pediatric): Secondary | ICD-10-CM | POA: Diagnosis not present

## 2023-11-28 DIAGNOSIS — M1711 Unilateral primary osteoarthritis, right knee: Secondary | ICD-10-CM | POA: Diagnosis not present

## 2023-11-28 DIAGNOSIS — Z0000 Encounter for general adult medical examination without abnormal findings: Secondary | ICD-10-CM | POA: Diagnosis not present

## 2023-11-28 DIAGNOSIS — I10 Essential (primary) hypertension: Secondary | ICD-10-CM | POA: Diagnosis not present

## 2023-11-28 DIAGNOSIS — R051 Acute cough: Secondary | ICD-10-CM | POA: Diagnosis not present

## 2023-12-04 DIAGNOSIS — I10 Essential (primary) hypertension: Secondary | ICD-10-CM | POA: Diagnosis not present

## 2023-12-04 DIAGNOSIS — E119 Type 2 diabetes mellitus without complications: Secondary | ICD-10-CM | POA: Diagnosis not present

## 2023-12-04 DIAGNOSIS — Z125 Encounter for screening for malignant neoplasm of prostate: Secondary | ICD-10-CM | POA: Diagnosis not present

## 2023-12-04 DIAGNOSIS — Z0000 Encounter for general adult medical examination without abnormal findings: Secondary | ICD-10-CM | POA: Diagnosis not present

## 2023-12-08 ENCOUNTER — Other Ambulatory Visit: Payer: Self-pay | Admitting: Family Medicine

## 2023-12-08 DIAGNOSIS — R9389 Abnormal findings on diagnostic imaging of other specified body structures: Secondary | ICD-10-CM

## 2023-12-09 ENCOUNTER — Encounter: Payer: Self-pay | Admitting: Family Medicine

## 2023-12-11 ENCOUNTER — Ambulatory Visit
Admission: RE | Admit: 2023-12-11 | Discharge: 2023-12-11 | Disposition: A | Discharge status: Home / Self Care | Payer: Self-pay | Source: Ambulatory Visit | Attending: Family Medicine | Admitting: Family Medicine

## 2023-12-11 DIAGNOSIS — R9389 Abnormal findings on diagnostic imaging of other specified body structures: Secondary | ICD-10-CM

## 2023-12-19 DIAGNOSIS — M545 Low back pain, unspecified: Secondary | ICD-10-CM | POA: Diagnosis not present

## 2023-12-25 DIAGNOSIS — M51362 Other intervertebral disc degeneration, lumbar region with discogenic back pain and lower extremity pain: Secondary | ICD-10-CM | POA: Diagnosis not present

## 2023-12-31 DIAGNOSIS — E039 Hypothyroidism, unspecified: Secondary | ICD-10-CM | POA: Diagnosis not present

## 2023-12-31 DIAGNOSIS — F319 Bipolar disorder, unspecified: Secondary | ICD-10-CM | POA: Diagnosis not present

## 2023-12-31 DIAGNOSIS — R9389 Abnormal findings on diagnostic imaging of other specified body structures: Secondary | ICD-10-CM | POA: Diagnosis not present

## 2023-12-31 DIAGNOSIS — I10 Essential (primary) hypertension: Secondary | ICD-10-CM | POA: Diagnosis not present

## 2023-12-31 DIAGNOSIS — G4733 Obstructive sleep apnea (adult) (pediatric): Secondary | ICD-10-CM | POA: Diagnosis not present

## 2023-12-31 DIAGNOSIS — E119 Type 2 diabetes mellitus without complications: Secondary | ICD-10-CM | POA: Diagnosis not present

## 2024-01-19 DIAGNOSIS — Z008 Encounter for other general examination: Secondary | ICD-10-CM | POA: Diagnosis not present

## 2024-01-19 DIAGNOSIS — E669 Obesity, unspecified: Secondary | ICD-10-CM | POA: Diagnosis not present

## 2024-01-19 DIAGNOSIS — N189 Chronic kidney disease, unspecified: Secondary | ICD-10-CM | POA: Diagnosis not present

## 2024-01-19 DIAGNOSIS — N182 Chronic kidney disease, stage 2 (mild): Secondary | ICD-10-CM | POA: Diagnosis not present

## 2024-01-19 DIAGNOSIS — I129 Hypertensive chronic kidney disease with stage 1 through stage 4 chronic kidney disease, or unspecified chronic kidney disease: Secondary | ICD-10-CM | POA: Diagnosis not present

## 2024-01-19 DIAGNOSIS — E1122 Type 2 diabetes mellitus with diabetic chronic kidney disease: Secondary | ICD-10-CM | POA: Diagnosis not present

## 2024-01-19 DIAGNOSIS — Z6830 Body mass index (BMI) 30.0-30.9, adult: Secondary | ICD-10-CM | POA: Diagnosis not present

## 2024-04-12 DIAGNOSIS — R051 Acute cough: Secondary | ICD-10-CM | POA: Diagnosis not present

## 2024-05-04 DIAGNOSIS — M51362 Other intervertebral disc degeneration, lumbar region with discogenic back pain and lower extremity pain: Secondary | ICD-10-CM | POA: Diagnosis not present

## 2024-05-04 DIAGNOSIS — M48061 Spinal stenosis, lumbar region without neurogenic claudication: Secondary | ICD-10-CM | POA: Diagnosis not present

## 2024-05-04 DIAGNOSIS — M5416 Radiculopathy, lumbar region: Secondary | ICD-10-CM | POA: Diagnosis not present

## 2024-05-04 DIAGNOSIS — M545 Low back pain, unspecified: Secondary | ICD-10-CM | POA: Diagnosis not present

## 2024-05-06 DIAGNOSIS — M5416 Radiculopathy, lumbar region: Secondary | ICD-10-CM | POA: Diagnosis not present

## 2024-05-27 DIAGNOSIS — E039 Hypothyroidism, unspecified: Secondary | ICD-10-CM | POA: Diagnosis not present

## 2024-05-27 DIAGNOSIS — E119 Type 2 diabetes mellitus without complications: Secondary | ICD-10-CM | POA: Diagnosis not present

## 2024-05-27 DIAGNOSIS — R0789 Other chest pain: Secondary | ICD-10-CM | POA: Diagnosis not present

## 2024-05-27 DIAGNOSIS — F319 Bipolar disorder, unspecified: Secondary | ICD-10-CM | POA: Diagnosis not present

## 2024-05-27 DIAGNOSIS — W010XXA Fall on same level from slipping, tripping and stumbling without subsequent striking against object, initial encounter: Secondary | ICD-10-CM | POA: Diagnosis not present

## 2024-05-27 DIAGNOSIS — I10 Essential (primary) hypertension: Secondary | ICD-10-CM | POA: Diagnosis not present

## 2024-06-15 DIAGNOSIS — R053 Chronic cough: Secondary | ICD-10-CM | POA: Diagnosis not present

## 2024-06-15 DIAGNOSIS — F3289 Other specified depressive episodes: Secondary | ICD-10-CM | POA: Diagnosis not present

## 2024-06-15 DIAGNOSIS — R059 Cough, unspecified: Secondary | ICD-10-CM | POA: Diagnosis not present

## 2024-06-15 DIAGNOSIS — K219 Gastro-esophageal reflux disease without esophagitis: Secondary | ICD-10-CM | POA: Diagnosis not present

## 2024-06-15 DIAGNOSIS — I10 Essential (primary) hypertension: Secondary | ICD-10-CM | POA: Diagnosis not present

## 2024-06-15 DIAGNOSIS — G4733 Obstructive sleep apnea (adult) (pediatric): Secondary | ICD-10-CM | POA: Diagnosis not present

## 2024-06-15 DIAGNOSIS — E119 Type 2 diabetes mellitus without complications: Secondary | ICD-10-CM | POA: Diagnosis not present

## 2024-06-15 DIAGNOSIS — E039 Hypothyroidism, unspecified: Secondary | ICD-10-CM | POA: Diagnosis not present

## 2024-07-15 DIAGNOSIS — F3289 Other specified depressive episodes: Secondary | ICD-10-CM | POA: Diagnosis not present

## 2024-07-15 DIAGNOSIS — M2550 Pain in unspecified joint: Secondary | ICD-10-CM | POA: Diagnosis not present

## 2024-07-15 DIAGNOSIS — I10 Essential (primary) hypertension: Secondary | ICD-10-CM | POA: Diagnosis not present

## 2024-07-15 DIAGNOSIS — E119 Type 2 diabetes mellitus without complications: Secondary | ICD-10-CM | POA: Diagnosis not present

## 2024-11-12 ENCOUNTER — Ambulatory Visit: Admission: RE | Admit: 2024-11-12 | Source: Ambulatory Visit

## 2024-11-12 ENCOUNTER — Other Ambulatory Visit: Payer: Self-pay | Admitting: Family Medicine

## 2024-11-12 DIAGNOSIS — I259 Chronic ischemic heart disease, unspecified: Secondary | ICD-10-CM

## 2024-11-12 DIAGNOSIS — R519 Headache, unspecified: Secondary | ICD-10-CM
